# Patient Record
Sex: Female | Born: 1980
Health system: Southern US, Community
[De-identification: ages and names within clinical notes are randomized; demographics above are authoritative.]

## PROBLEM LIST (undated history)

## (undated) ENCOUNTER — Emergency Department (HOSPITAL_COMMUNITY): Payer: BLUE CROSS/BLUE SHIELD

## (undated) DIAGNOSIS — K219 Gastro-esophageal reflux disease without esophagitis: Secondary | ICD-10-CM

## (undated) DIAGNOSIS — R51 Headache: Secondary | ICD-10-CM

## (undated) DIAGNOSIS — F329 Major depressive disorder, single episode, unspecified: Secondary | ICD-10-CM

## (undated) DIAGNOSIS — F32A Depression, unspecified: Secondary | ICD-10-CM

## (undated) DIAGNOSIS — I10 Essential (primary) hypertension: Secondary | ICD-10-CM

## (undated) DIAGNOSIS — E669 Obesity, unspecified: Secondary | ICD-10-CM

## (undated) DIAGNOSIS — N809 Endometriosis, unspecified: Secondary | ICD-10-CM

## (undated) DIAGNOSIS — T4145XA Adverse effect of unspecified anesthetic, initial encounter: Secondary | ICD-10-CM

## (undated) DIAGNOSIS — T8859XA Other complications of anesthesia, initial encounter: Secondary | ICD-10-CM

## (undated) DIAGNOSIS — N83209 Unspecified ovarian cyst, unspecified side: Secondary | ICD-10-CM

## (undated) DIAGNOSIS — R519 Headache, unspecified: Secondary | ICD-10-CM

## (undated) HISTORY — PX: ABLATION: SHX5711

## (undated) HISTORY — PX: TUBAL LIGATION: SHX77

## (undated) HISTORY — PX: PARTIAL HYSTERECTOMY: SHX80

---

## 2007-07-20 ENCOUNTER — Ambulatory Visit: Payer: Self-pay | Admitting: Family Medicine

## 2007-08-07 ENCOUNTER — Emergency Department: Payer: Self-pay | Admitting: Internal Medicine

## 2007-09-22 ENCOUNTER — Encounter: Payer: Self-pay | Admitting: Family Medicine

## 2007-09-23 ENCOUNTER — Encounter: Payer: Self-pay | Admitting: Family Medicine

## 2008-06-08 ENCOUNTER — Emergency Department: Payer: Self-pay | Admitting: Emergency Medicine

## 2009-03-08 ENCOUNTER — Emergency Department: Payer: Self-pay | Admitting: Emergency Medicine

## 2009-05-02 ENCOUNTER — Emergency Department: Payer: Self-pay | Admitting: Emergency Medicine

## 2010-08-01 ENCOUNTER — Emergency Department: Payer: Self-pay | Admitting: Emergency Medicine

## 2011-12-15 ENCOUNTER — Ambulatory Visit: Payer: Self-pay | Admitting: Family Medicine

## 2012-03-14 ENCOUNTER — Emergency Department: Payer: Self-pay | Admitting: Emergency Medicine

## 2012-06-24 ENCOUNTER — Emergency Department: Payer: Self-pay | Admitting: Emergency Medicine

## 2013-05-15 ENCOUNTER — Emergency Department: Payer: Self-pay | Admitting: Emergency Medicine

## 2014-01-01 ENCOUNTER — Emergency Department: Payer: Self-pay | Admitting: Emergency Medicine

## 2014-01-01 LAB — CBC WITH DIFFERENTIAL/PLATELET
BASOS ABS: 0.1 10*3/uL (ref 0.0–0.1)
BASOS PCT: 1 %
EOS ABS: 0.1 10*3/uL (ref 0.0–0.7)
Eosinophil %: 0.4 %
HCT: 40.6 % (ref 35.0–47.0)
HGB: 12.9 g/dL (ref 12.0–16.0)
LYMPHS ABS: 2.2 10*3/uL (ref 1.0–3.6)
LYMPHS PCT: 15.2 %
MCH: 25.3 pg — ABNORMAL LOW (ref 26.0–34.0)
MCHC: 31.8 g/dL — ABNORMAL LOW (ref 32.0–36.0)
MCV: 79 fL — AB (ref 80–100)
Monocyte #: 0.8 x10 3/mm (ref 0.2–0.9)
Monocyte %: 5.7 %
Neutrophil #: 11.3 10*3/uL — ABNORMAL HIGH (ref 1.4–6.5)
Neutrophil %: 77.7 %
Platelet: 421 10*3/uL (ref 150–440)
RBC: 5.11 10*6/uL (ref 3.80–5.20)
RDW: 15.9 % — AB (ref 11.5–14.5)
WBC: 14.5 10*3/uL — ABNORMAL HIGH (ref 3.6–11.0)

## 2014-01-01 LAB — URINALYSIS, COMPLETE
BLOOD: NEGATIVE
Bacteria: NONE SEEN
Bilirubin,UR: NEGATIVE
GLUCOSE, UR: NEGATIVE mg/dL (ref 0–75)
KETONE: NEGATIVE
Leukocyte Esterase: NEGATIVE
Nitrite: NEGATIVE
PH: 5 (ref 4.5–8.0)
Protein: 30
RBC,UR: 7 /HPF (ref 0–5)
Specific Gravity: 1.029 (ref 1.003–1.030)
Squamous Epithelial: 10

## 2014-01-01 LAB — COMPREHENSIVE METABOLIC PANEL
ALK PHOS: 70 U/L
Albumin: 4.1 g/dL (ref 3.4–5.0)
Anion Gap: 3 — ABNORMAL LOW (ref 7–16)
BUN: 8 mg/dL (ref 7–18)
Bilirubin,Total: 0.4 mg/dL (ref 0.2–1.0)
CREATININE: 0.95 mg/dL (ref 0.60–1.30)
Calcium, Total: 9.6 mg/dL (ref 8.5–10.1)
Chloride: 102 mmol/L (ref 98–107)
Co2: 27 mmol/L (ref 21–32)
EGFR (African American): >60
EGFR (Non-African Amer.): >60
GLUCOSE: 104 mg/dL — AB (ref 65–99)
OSMOLALITY: 267 (ref 275–301)
Potassium: 3.6 mmol/L (ref 3.5–5.1)
SGOT(AST): 43 U/L — ABNORMAL HIGH (ref 15–37)
SGPT (ALT): 46 U/L (ref 12–78)
SODIUM: 132 mmol/L — AB (ref 136–145)
TOTAL PROTEIN: 9.9 g/dL — AB (ref 6.4–8.2)

## 2014-01-01 LAB — PROTEIN / CREATININE RATIO, URINE
Creatinine, Urine: 413.6 mg/dL — ABNORMAL HIGH (ref 30.0–125.0)
PROTEIN, RANDOM URINE: 25 mg/dL — AB (ref 0–12)
Protein/Creat. Ratio: 60 mg/gCREAT (ref 0–200)

## 2014-01-01 LAB — WET PREP, GENITAL

## 2014-01-01 LAB — GC/CHLAMYDIA PROBE AMP

## 2015-03-25 ENCOUNTER — Encounter
Admission: RE | Admit: 2015-03-25 | Discharge: 2015-03-25 | Disposition: A | Discharge status: Home / Self Care | Payer: BLUE CROSS/BLUE SHIELD | Source: Ambulatory Visit | Attending: Obstetrics and Gynecology | Admitting: Obstetrics and Gynecology

## 2015-03-25 DIAGNOSIS — Z801 Family history of malignant neoplasm of trachea, bronchus and lung: Secondary | ICD-10-CM | POA: Diagnosis not present

## 2015-03-25 DIAGNOSIS — R51 Headache: Secondary | ICD-10-CM | POA: Diagnosis not present

## 2015-03-25 DIAGNOSIS — G8929 Other chronic pain: Secondary | ICD-10-CM | POA: Diagnosis not present

## 2015-03-25 DIAGNOSIS — Z01812 Encounter for preprocedural laboratory examination: Secondary | ICD-10-CM | POA: Insufficient documentation

## 2015-03-25 DIAGNOSIS — K219 Gastro-esophageal reflux disease without esophagitis: Secondary | ICD-10-CM | POA: Diagnosis not present

## 2015-03-25 DIAGNOSIS — Z6841 Body Mass Index (BMI) 40.0 and over, adult: Secondary | ICD-10-CM | POA: Diagnosis not present

## 2015-03-25 DIAGNOSIS — M545 Low back pain: Secondary | ICD-10-CM | POA: Diagnosis not present

## 2015-03-25 DIAGNOSIS — Z8249 Family history of ischemic heart disease and other diseases of the circulatory system: Secondary | ICD-10-CM | POA: Diagnosis not present

## 2015-03-25 DIAGNOSIS — G43909 Migraine, unspecified, not intractable, without status migrainosus: Secondary | ICD-10-CM | POA: Diagnosis not present

## 2015-03-25 DIAGNOSIS — F329 Major depressive disorder, single episode, unspecified: Secondary | ICD-10-CM | POA: Diagnosis not present

## 2015-03-25 DIAGNOSIS — E785 Hyperlipidemia, unspecified: Secondary | ICD-10-CM | POA: Diagnosis not present

## 2015-03-25 DIAGNOSIS — Z9889 Other specified postprocedural states: Secondary | ICD-10-CM | POA: Insufficient documentation

## 2015-03-25 DIAGNOSIS — Z803 Family history of malignant neoplasm of breast: Secondary | ICD-10-CM | POA: Diagnosis not present

## 2015-03-25 DIAGNOSIS — E669 Obesity, unspecified: Secondary | ICD-10-CM | POA: Diagnosis not present

## 2015-03-25 DIAGNOSIS — N920 Excessive and frequent menstruation with regular cycle: Secondary | ICD-10-CM | POA: Diagnosis not present

## 2015-03-25 HISTORY — DX: Headache: R51

## 2015-03-25 HISTORY — DX: Headache, unspecified: R51.9

## 2015-03-25 HISTORY — DX: Gastro-esophageal reflux disease without esophagitis: K21.9

## 2015-03-25 LAB — CBC
HCT: 39 % (ref 35.0–47.0)
Hemoglobin: 12.3 g/dL (ref 12.0–16.0)
MCH: 26.2 pg (ref 26.0–34.0)
MCHC: 31.7 g/dL — ABNORMAL LOW (ref 32.0–36.0)
MCV: 82.8 fL (ref 80.0–100.0)
PLATELETS: 385 10*3/uL (ref 150–440)
RBC: 4.71 MIL/uL (ref 3.80–5.20)
RDW: 17.4 % — AB (ref 11.5–14.5)
WBC: 7.8 10*3/uL (ref 3.6–11.0)

## 2015-03-25 LAB — BASIC METABOLIC PANEL
ANION GAP: 8 (ref 5–15)
BUN: 7 mg/dL (ref 6–20)
CO2: 27 mmol/L (ref 22–32)
Calcium: 9.3 mg/dL (ref 8.9–10.3)
Chloride: 105 mmol/L (ref 101–111)
Creatinine, Ser: 0.71 mg/dL (ref 0.44–1.00)
GFR calc non Af Amer: >60 mL/min (ref >60)
Glucose, Bld: 101 mg/dL — ABNORMAL HIGH (ref 65–99)
Potassium: 3.6 mmol/L (ref 3.5–5.1)
Sodium: 140 mmol/L (ref 135–145)

## 2015-05-12 ENCOUNTER — Other Ambulatory Visit: Payer: Self-pay

## 2015-05-12 ENCOUNTER — Encounter: Payer: Self-pay | Admitting: *Deleted

## 2015-05-12 DIAGNOSIS — N920 Excessive and frequent menstruation with regular cycle: Secondary | ICD-10-CM | POA: Diagnosis not present

## 2015-05-12 NOTE — Patient Instructions (Signed)
  Your procedure is scheduled on:05/27/15 Report to Day Surgery. To find out your arrival time please call [phone] between 1PM - 3PM on 05/23/15.  Remember: Instructions that are not followed completely may result in serious medical risk, up to and including death, or upon the discretion of your surgeon and anesthesiologist your surgery may need to be rescheduled.    _x___ 1. Do not eat food or drink liquids after midnight. No gum chewing or hard candies.     _x___ 2. No Alcohol for 24 hours before or after surgery.   ____ 3. Bring all medications with you on the day of surgery if instructed.    __x__ 4. Notify your doctor if there is any change in your medical condition     (cold, fever, infections).     Do not wear jewelry, make-up, hairpins, clips or nail polish.  Do not wear lotions, powders, or perfumes. You may wear deodorant.  Do not shave 48 hours prior to surgery. Men may shave face and neck.  Do not bring valuables to the hospital.    [facility] is not responsible for any belongings or valuables.               Contacts, dentures or bridgework may not be worn into surgery.  Leave your suitcase in the car. After surgery it may be brought to your room.  For patients admitted to the hospital, discharge time is determined by your                treatment team.   Patients discharged the day of surgery will not be allowed to drive home.   Please read over the following fact sheets that you were given:   Surgical Site Infection Prevention   ____ Take these medicines the morning of surgery with A SIP OF WATER:    1.   2.   3.   4.  5.  6.  ____ Fleet Enema (as directed)   ____ Use CHG Soap as directed  ____ Use inhalers on the day of surgery  ____ Stop metformin 2 days prior to surgery    ____ Take 1/2 of usual insulin dose the night before surgery and none on the morning of surgery.   ____ Stop Coumadin/Plavix/aspirin on  ____ Stop Anti-inflammatories on     ____ Stop supplements until after surgery.    ____ Bring C-Pap to the hospital.

## 2015-05-20 ENCOUNTER — Other Ambulatory Visit: Payer: Self-pay

## 2015-05-21 MED ORDER — NA HYALUR & NA CHOND-NA HYALUR 0.55-0.5 ML IO KIT
PACK | INTRAOCULAR | Status: AC
  Filled 2015-05-21: qty 1.05

## 2015-05-21 MED ORDER — CEFUROXIME OPHTHALMIC INJECTION 1 MG/0.1 ML
INJECTION | OPHTHALMIC | Status: AC
  Filled 2015-05-21: qty 0.1

## 2015-05-27 ENCOUNTER — Ambulatory Visit: Payer: BLUE CROSS/BLUE SHIELD

## 2015-05-27 ENCOUNTER — Ambulatory Visit: Payer: BLUE CROSS/BLUE SHIELD | Admitting: Anesthesiology

## 2015-05-27 ENCOUNTER — Ambulatory Visit
Admission: RE | Admit: 2015-05-27 | Discharge: 2015-05-27 | Disposition: A | Discharge status: Home / Self Care | Payer: BLUE CROSS/BLUE SHIELD | Source: Ambulatory Visit | Attending: Obstetrics and Gynecology | Admitting: Obstetrics and Gynecology

## 2015-05-27 ENCOUNTER — Encounter
Admission: RE | Disposition: A | Discharge status: Home / Self Care | Payer: Self-pay | Source: Ambulatory Visit | Attending: Obstetrics and Gynecology

## 2015-05-27 ENCOUNTER — Encounter: Payer: Self-pay | Admitting: *Deleted

## 2015-05-27 DIAGNOSIS — Z801 Family history of malignant neoplasm of trachea, bronchus and lung: Secondary | ICD-10-CM | POA: Insufficient documentation

## 2015-05-27 DIAGNOSIS — G43909 Migraine, unspecified, not intractable, without status migrainosus: Secondary | ICD-10-CM | POA: Insufficient documentation

## 2015-05-27 DIAGNOSIS — Z8249 Family history of ischemic heart disease and other diseases of the circulatory system: Secondary | ICD-10-CM | POA: Insufficient documentation

## 2015-05-27 DIAGNOSIS — Z803 Family history of malignant neoplasm of breast: Secondary | ICD-10-CM | POA: Insufficient documentation

## 2015-05-27 DIAGNOSIS — K219 Gastro-esophageal reflux disease without esophagitis: Secondary | ICD-10-CM | POA: Insufficient documentation

## 2015-05-27 DIAGNOSIS — G8929 Other chronic pain: Secondary | ICD-10-CM | POA: Insufficient documentation

## 2015-05-27 DIAGNOSIS — Z6841 Body Mass Index (BMI) 40.0 and over, adult: Secondary | ICD-10-CM | POA: Insufficient documentation

## 2015-05-27 DIAGNOSIS — M545 Low back pain: Secondary | ICD-10-CM | POA: Insufficient documentation

## 2015-05-27 DIAGNOSIS — N920 Excessive and frequent menstruation with regular cycle: Secondary | ICD-10-CM | POA: Insufficient documentation

## 2015-05-27 DIAGNOSIS — F329 Major depressive disorder, single episode, unspecified: Secondary | ICD-10-CM | POA: Insufficient documentation

## 2015-05-27 DIAGNOSIS — E785 Hyperlipidemia, unspecified: Secondary | ICD-10-CM | POA: Insufficient documentation

## 2015-05-27 DIAGNOSIS — E669 Obesity, unspecified: Secondary | ICD-10-CM | POA: Insufficient documentation

## 2015-05-27 HISTORY — PX: HYSTEROSCOPY WITH NOVASURE: SHX5574

## 2015-05-27 LAB — POCT PREGNANCY, URINE: Preg Test, Ur: NEGATIVE

## 2015-05-27 SURGERY — HYSTEROSCOPY WITH NOVASURE
Anesthesia: General

## 2015-05-27 MED ORDER — SILVER NITRATE-POT NITRATE 75-25 % EX MISC
CUTANEOUS | Status: AC
  Filled 2015-05-27: qty 2

## 2015-05-27 MED ORDER — MIDAZOLAM HCL 2 MG/2ML IJ SOLN
INTRAMUSCULAR | Status: DC | PRN
  Administered 2015-05-27: 2 mg via INTRAVENOUS

## 2015-05-27 MED ORDER — DOCUSATE SODIUM 100 MG PO CAPS: 100.0000 mg | ORAL_CAPSULE | Freq: Two times a day (BID) | ORAL | Status: DC

## 2015-05-27 MED ORDER — ACETAMINOPHEN 500 MG PO TABS
1000.0000 mg | ORAL_TABLET | Freq: Once | ORAL | Status: AC
  Administered 2015-05-27: 1000 mg via ORAL
  Filled 2015-05-27: qty 2

## 2015-05-27 MED ORDER — FAMOTIDINE 20 MG PO TABS: 20.0000 mg | ORAL_TABLET | Freq: Once | ORAL | Status: DC

## 2015-05-27 MED ORDER — LIDOCAINE-EPINEPHRINE 1 %-1:100000 IJ SOLN
INTRAMUSCULAR | Status: DC | PRN
  Administered 2015-05-27: 20 mL

## 2015-05-27 MED ORDER — FAMOTIDINE 20 MG PO TABS
20.0000 mg | ORAL_TABLET | Freq: Once | ORAL | Status: AC
  Administered 2015-05-27: 20 mg via ORAL

## 2015-05-27 MED ORDER — FENTANYL CITRATE (PF) 100 MCG/2ML IJ SOLN
INTRAMUSCULAR | Status: DC | PRN
  Administered 2015-05-27: 150 ug via INTRAVENOUS
  Administered 2015-05-27 (×2): 50 ug via INTRAVENOUS

## 2015-05-27 MED ORDER — LIDOCAINE HCL (CARDIAC) 20 MG/ML IV SOLN
INTRAVENOUS | Status: DC | PRN
  Administered 2015-05-27: 50 mg via INTRAVENOUS

## 2015-05-27 MED ORDER — ONDANSETRON HCL 4 MG/2ML IJ SOLN
INTRAMUSCULAR | Status: DC | PRN
  Administered 2015-05-27: 4 mg via INTRAVENOUS

## 2015-05-27 MED ORDER — LACTATED RINGERS IV SOLN
INTRAVENOUS | Status: DC
  Administered 2015-05-27: 11:00:00 via INTRAVENOUS

## 2015-05-27 MED ORDER — HYDROCODONE-ACETAMINOPHEN 5-325 MG PO TABS: 1.0000 | ORAL_TABLET | Freq: Four times a day (QID) | ORAL | Status: DC | PRN

## 2015-05-27 MED ORDER — IBUPROFEN 200 MG PO TABS: 200.0000 mg | ORAL_TABLET | Freq: Four times a day (QID) | ORAL | Status: DC

## 2015-05-27 MED ORDER — DEXAMETHASONE SODIUM PHOSPHATE 4 MG/ML IJ SOLN
INTRAMUSCULAR | Status: DC | PRN
  Administered 2015-05-27: 5 mg via INTRAVENOUS

## 2015-05-27 MED ORDER — KETOROLAC TROMETHAMINE 30 MG/ML IJ SOLN
INTRAMUSCULAR | Status: DC | PRN
  Administered 2015-05-27: 30 mg via INTRAVENOUS

## 2015-05-27 MED ORDER — SILVER NITRATE-POT NITRATE 75-25 % EX MISC
CUTANEOUS | Status: DC | PRN
Start: 2015-05-27 — End: 2015-05-27
  Administered 2015-05-27: 2

## 2015-05-27 MED ORDER — SUCCINYLCHOLINE CHLORIDE 20 MG/ML IJ SOLN
INTRAMUSCULAR | Status: DC | PRN
  Administered 2015-05-27: 100 mg via INTRAVENOUS

## 2015-05-27 MED ORDER — LACTATED RINGERS IV SOLN: INTRAVENOUS | Status: DC

## 2015-05-27 SURGICAL SUPPLY — 20 items
CANISTER SUCT 3000ML (MISCELLANEOUS) ×3 IMPLANT
CATH ROBINSON RED A/P 16FR (CATHETERS) ×3 IMPLANT
GLOVE BIO SURGEON STRL SZ7 (GLOVE) ×3 IMPLANT
GLOVE INDICATOR 7.5 STRL GRN (GLOVE) ×3 IMPLANT
GOWN STRL REUS W/ TWL LRG LVL3 (GOWN DISPOSABLE) ×1 IMPLANT
GOWN STRL REUS W/ TWL XL LVL3 (GOWN DISPOSABLE) ×1 IMPLANT
GOWN STRL REUS W/TWL LRG LVL3 (GOWN DISPOSABLE) ×2
GOWN STRL REUS W/TWL XL LVL3 (GOWN DISPOSABLE) ×2
IV LACTATED RINGERS 1000ML (IV SOLUTION) ×3 IMPLANT
KIT RM TURNOVER CYSTO AR (KITS) ×3 IMPLANT
NEEDLE SPNL 22GX5 LNG QUINC BK (NEEDLE) ×3 IMPLANT
NOVASURE ENDOMETRIAL ABLATION (MISCELLANEOUS) IMPLANT
NS IRRIG 500ML POUR BTL (IV SOLUTION) ×3 IMPLANT
PACK DNC HYST (MISCELLANEOUS) ×3 IMPLANT
PAD OB MATERNITY 4.3X12.25 (PERSONAL CARE ITEMS) ×3 IMPLANT
PAD PREP 24X41 OB/GYN DISP (PERSONAL CARE ITEMS) ×3 IMPLANT
SYR CONTROL 10ML (SYRINGE) ×3 IMPLANT
TOWEL OR 17X26 4PK STRL BLUE (TOWEL DISPOSABLE) ×3 IMPLANT
TUBING CONNECTING 10 (TUBING) ×2 IMPLANT
TUBING CONNECTING 10' (TUBING) ×1

## 2015-05-27 NOTE — Anesthesia Postprocedure Evaluation (Signed)
  Anesthesia Post-op Note  Patient: Sherry Watkins  Procedure(s) Performed: Procedure(s): HYSTEROSCOPY WITH NOVASURE (N/A)  Anesthesia type:General  Patient location: PACU  Post pain: Pain level controlled  Post assessment: Post-op Vital signs reviewed, Patient's Cardiovascular Status Stable, Respiratory Function Stable, Patent Airway and No signs of Nausea or vomiting  Post vital signs: Reviewed and stable  Last Vitals:  Filed Vitals:   05/27/15 1406  BP: 152/90  Pulse: 147  Temp: 36.8 C  Resp: 18    Level of consciousness: awake, alert  and patient cooperative  Complications: No apparent anesthesia complications

## 2015-05-27 NOTE — Discharge Instructions (Addendum)
[facility]  We will discuss your surgery once again in detail at your post-op visit in two to four weeks. If you havent already done so, please call to make your appointment as soon as possible.  [place] [facility] [place]  [address] [place], [place] [address] [place], [place] [address]  Phone: [phone] Phone: [phone]  Fax: [phone] Fax: [phone]   Hysteroscopy and endometrial ablation, Care After Refer to this sheet in the next few weeks. These instructions provide you with information on caring for yourself after your procedure. Your health care provider may also give you more specific instructions. Your treatment has been planned according to current medical practices, but problems sometimes occur. Call your health care provider if you have any problems or questions after your procedure.  WHAT TO EXPECT AFTER THE PROCEDURE After your procedure, it is typical to have the following:  You may have some cramping. This normally lasts for a couple days.  You may have bleeding. This can vary from light spotting for a few days to menstrual-like bleeding for 3-7 days. HOME CARE INSTRUCTIONS  Rest for the first 1-2 days after the procedure.  Only take over-the-counter or prescription medicines as directed by your health care provider. Do not take aspirin. It can increase the chances of bleeding.  Take showers instead of baths for 2 weeks or as directed by your health care provider.  Do not drive for 24 hours or as directed.  Do not drink alcohol while taking pain medicine.  Do not use tampons, douche, or have sexual intercourse for 2 weeks or until your health care provider says it is okay.  Take your temperature twice a day for 4-5 days. Write it down each time.  Follow your health care provider's advice about diet, exercise, and lifting.  If you develop constipation, you may:  Take a mild laxative if your health care provider approves.  Add  bran foods to your diet.  Drink enough fluids to keep your urine clear or pale yellow.  Try to have someone with you or available to you for the first 24-48 hours, especially if you were given a general anesthetic.  Follow up with your health care provider as directed. SEEK MEDICAL CARE IF:  You feel dizzy or lightheaded.  You feel sick to your stomach (nauseous).  You have abnormal vaginal discharge.  You have a rash.  You have pain that is not controlled with medicine. SEEK IMMEDIATE MEDICAL CARE IF:  You have bleeding that is heavier than a normal menstrual period.  You have a fever.  You have increasing cramps or pain, not controlled with medicine.  You have new belly (abdominal) pain.  You pass out.  You have pain in the tops of your shoulders (shoulder strap areas).  You have shortness of breath. Document Released: 08/29/2013 Document Reviewed: 08/29/2013 [facility] Patient Information 2015 [place], [place]. This information is not intended to replace advice given to you by your health care provider. Make sure you discuss any questions you have with your health care provider. AMBULATORY SURGERY  DISCHARGE INSTRUCTIONS   1) The drugs that you were given will stay in your system until tomorrow so for the next 24 hours you should not:  A) Drive an automobile B) Make any legal decisions C) Drink any alcoholic beverage   2) You may resume regular meals tomorrow.  Today it is better to start with liquids and gradually work up to solid foods.  You may eat  anything you prefer, but it is better to start with liquids, then soup and crackers, and gradually work up to solid foods.   3) Please notify your doctor immediately if you have any unusual bleeding, trouble breathing, redness and pain at the surgery site, drainage, fever, or pain not relieved by medication.    4) Additional Instructions:        Please contact your physician with any problems or Same Day  Surgery at [phone], Monday through Friday 6 am to 4 pm, or [place] at [facility] number at [phone].AMBULATORY SURGERY  DISCHARGE INSTRUCTIONS   5) The drugs that you were given will stay in your system until tomorrow so for the next 24 hours you should not:  D) Drive an automobile E) Make any legal decisions F) Drink any alcoholic beverage   6) You may resume regular meals tomorrow.  Today it is better to start with liquids and gradually work up to solid foods.  You may eat anything you prefer, but it is better to start with liquids, then soup and crackers, and gradually work up to solid foods.   7) Please notify your doctor immediately if you have any unusual bleeding, trouble breathing, redness and pain at the surgery site, drainage, fever, or pain not relieved by medication.    8) Additional Instructions:        Please contact your physician with any problems or Same Day Surgery at [phone], Monday through Friday 6 am to 4 pm, or [place] at [facility] number at [phone].

## 2015-05-27 NOTE — H&P (Signed)
GYN H&P Patient still amenable with proceeding with hysteroscopy, novasure for menorrhagia. All questions asked and answered and will proceed when all parties are ready.  Durene Romans MD [facility]  Pager: [phone]

## 2015-05-27 NOTE — Anesthesia Preprocedure Evaluation (Signed)
Anesthesia Evaluation  Patient identified by MRN, date of birth, ID band Patient awake    Reviewed: Allergy & Precautions, NPO status , Patient's Chart, lab work & pertinent test results  Airway Mallampati: III  TM Distance: <3 FB Neck ROM: Full   Comment: Large neck Dental  (+) Chipped   Pulmonary neg pulmonary ROS,  breath sounds clear to auscultation  Pulmonary exam normal       Cardiovascular negative cardio ROS Normal cardiovascular exam No Hx of HTN , but BP up today   Neuro/Psych  Headaches, negative psych ROS   GI/Hepatic Neg liver ROS, GERD-  ,  Endo/Other  negative endocrine ROS  Renal/GU negative Renal ROS  negative genitourinary   Musculoskeletal negative musculoskeletal ROS (+)   Abdominal (+) + obese,   Peds negative pediatric ROS (+)  Hematology negative hematology ROS (+)   Anesthesia Other Findings   Reproductive/Obstetrics                             Anesthesia Physical Anesthesia Plan  ASA: II  Anesthesia Plan: General   Post-op Pain Management:    Induction: Intravenous and Rapid sequence  Airway Management Planned: Oral ETT  Additional Equipment:   Intra-op Plan:   Post-operative Plan: Extubation in OR  Informed Consent: I have reviewed the patients History and Physical, chart, labs and discussed the procedure including the risks, benefits and alternatives for the proposed anesthesia with the patient or authorized representative who has indicated his/her understanding and acceptance.   Dental advisory given  Plan Discussed with: CRNA and Surgeon  Anesthesia Plan Comments: (Patient has a large neck with significant reflux Hx)        Anesthesia Quick Evaluation

## 2015-05-27 NOTE — Anesthesia Procedure Notes (Signed)
Procedure Name: Intubation Date/Time: 05/27/2015 1:16 PM Performed by: Rolla Plate Pre-anesthesia Checklist: Patient identified, Patient being monitored, Timeout performed, Emergency Drugs available and Suction available Patient Re-evaluated:Patient Re-evaluated prior to inductionOxygen Delivery Method: Circle system utilized Preoxygenation: Pre-oxygenation with 100% oxygen Intubation Type: IV induction and Rapid sequence Laryngoscope Size: Miller and 2 Grade View: Grade II Tube type: Oral Tube size: 7.0 mm Number of attempts: 1 Airway Equipment and Method: Stylet and Patient positioned with wedge pillow Placement Confirmation: ETT inserted through vocal cords under direct vision,  positive ETCO2 and breath sounds checked- equal and bilateral Secured at: 22 cm Tube secured with: Tape Dental Injury: Teeth and Oropharynx as per pre-operative assessment

## 2015-05-27 NOTE — Progress Notes (Signed)
Peri pad - clean, dry and intact

## 2015-05-27 NOTE — Op Note (Addendum)
Operative Note   05/27/2015  PRE-OP DIAGNOSIS: Menorhagia, history of migraines, BMI 44   POST-OP DIAGNOSIS: Same   SURGEON: Surgeon(s) and Role:    * Durene Romans, MD - Primary  ASSISTANT: None  PROCEDURE: HYSTEROSCOPY WITH NOVASURE ENDOMETRIAL ABLATION  ANESTHESIA: General and paracervical block  ESTIMATED BLOOD LOSS: 68mL  DRAINS: I/O cath for 1mL UOP   TOTAL IV FLUIDS: 541mL crystalloid  SPECIMENS: none  VTE PROPHYLAXIS: SCDs to the bilateral lower extremities  ANTIBIOTICS: not indicated  FLUID DEFICIT: 188CZ  COMPLICATIONS: None  DISPOSITION: PACU - hemodynamically stable.  CONDITION: stable  INDICATIONS: options discussed with patient, including Mirena, in terms of minor surgery/medical management and patient elected for Novasure  FINDINGS: Exam under anesthesia revealed small, mobile anteverted uterus with no masses and bilateral adnexa without masses or fullness. Hysteroscopy revealed a grossly normal appearing uterine cavity with bilateral tubal ostia and normal appearing endocervical canal. After endometrial ablation, repeat ablation noted uniform endometrial ablation, down to the internal cervical os, with the cervical canal unablated  PROCEDURE IN DETAIL:  After informed consent was obtained, the patient was taken to the operating room where anesthesia was obtained without difficulty. The patient was positioned in the dorsal lithotomy position in [place].  The patient's bladder was catheterized with an in and out foley catheter.  The patient was examined under anesthesia, with the above noted findings.  The bi-valved speculum was placed inside the patient's vagina, and the the anterior lip of the cervix was seen and grasped with the tenaculum.  A paracervical block was achieved with 53mL of 1% lidocaine.  The cervical length was 5cm and the uterine cavity length was 5cm. The cervix was already dilated enough to fit the 58mm hysteroscope with the above  noted findings.  Next, the Novasure was then deployed, in the usual fashion, with cavity width reading 4.6cm.  After pre test checks, it was activated for 103 seconds at 127 power. It was then removed and repeat hysteroscopy performed, with the above noted findings. All instruments were removed after excellent hemostasis was noted, with silver nitrate applied to the tenaculum site. All instruments were then removed and the patient taken out of lithotomy.  The patient tolerated the procedure well.  Sponge and instrument counts were correct x 2.  The patient was taken to recovery room in excellent condition.  Durene Romans MD [facility] Pager [phone]

## 2015-05-27 NOTE — Transfer of Care (Signed)
Immediate Anesthesia Transfer of Care Note  Patient: JIAYI LENGACHER  Procedure(s) Performed: Procedure(s): HYSTEROSCOPY WITH NOVASURE (N/A)  Patient Location: PACU  Anesthesia Type:General  Level of Consciousness: awake  Airway & Oxygen Therapy: Patient Spontanous Breathing and Patient connected to face mask oxygen  Post-op Assessment: Report given to RN  Post vital signs: Reviewed  Last Vitals:  Filed Vitals:   05/27/15 1406  BP: 152/90  Pulse: 128  Temp: 36.8 C  Resp: 17    Complications: No apparent anesthesia complications

## 2015-05-28 NOTE — Anesthesia Postprocedure Evaluation (Deleted)
  Anesthesia Post-op Note  Patient: Sherry Watkins  Procedure(s) Performed: Procedure(s): HYSTEROSCOPY WITH NOVASURE (N/A)  Anesthesia type:General  Patient location: PACU  Post pain: Pain level controlled  Post assessment: Post-op Vital signs reviewed, Patient's Cardiovascular Status Stable, Respiratory Function Stable, Patent Airway and No signs of Nausea or vomiting  Post vital signs: Reviewed and stable  Last Vitals:  Filed Vitals:   05/27/15 1544  BP: 156/92  Pulse: 87  Temp:   Resp: 18    Level of consciousness: awake, alert  and patient cooperative  Complications: No apparent anesthesia complications

## 2015-06-03 ENCOUNTER — Emergency Department
Admission: EM | Admit: 2015-06-03 | Discharge: 2015-06-03 | Disposition: A | Discharge status: Home / Self Care | Payer: BLUE CROSS/BLUE SHIELD | Attending: Emergency Medicine | Admitting: Emergency Medicine

## 2015-06-03 ENCOUNTER — Encounter: Payer: Self-pay | Admitting: Emergency Medicine

## 2015-06-03 DIAGNOSIS — J029 Acute pharyngitis, unspecified: Secondary | ICD-10-CM | POA: Diagnosis present

## 2015-06-03 DIAGNOSIS — K219 Gastro-esophageal reflux disease without esophagitis: Secondary | ICD-10-CM | POA: Insufficient documentation

## 2015-06-03 DIAGNOSIS — Z79899 Other long term (current) drug therapy: Secondary | ICD-10-CM | POA: Diagnosis not present

## 2015-06-03 LAB — POCT RAPID STREP A: Streptococcus, Group A Screen (Direct): NEGATIVE

## 2015-06-03 MED ORDER — FAMOTIDINE 20 MG PO TABS: 20.0000 mg | ORAL_TABLET | Freq: Two times a day (BID) | ORAL | Status: DC

## 2015-06-03 MED ORDER — FAMOTIDINE 20 MG PO TABS
40.0000 mg | ORAL_TABLET | Freq: Two times a day (BID) | ORAL | Status: DC
  Administered 2015-06-03: 40 mg via ORAL
  Filled 2015-06-03: qty 2

## 2015-06-03 NOTE — ED Notes (Signed)
Pt reports sore throat x1 week; Pt reports throat feeling "tight"; airway in tact, no distress noted.

## 2015-06-03 NOTE — ED Provider Notes (Signed)
[facility] Emergency Department Provider Note ____________________________________________  Time seen: 1218  I have reviewed the triage vital signs and the nursing notes.  HISTORY  Chief Complaint  Sore Throat  HPI Sherry Watkins is a 34 y.o. female reports throat tightness over the last week.  She denies SOB, dysphagia, or choking.  She reports recent intubation during gynecology surgery last week. Symptoms began after the procedure. She also gives a history of reflux, for which she is not currently being treated. She denies any fevers, chills, sweats, or nausea.   Past Medical History  Diagnosis Date  . GERD (gastroesophageal reflux disease)   . Headache    There are no active problems to display for this patient.  Past Surgical History  Procedure Laterality Date  . Cesarean section    . Tubal ligation    . Hysteroscopy with novasure N/A 05/27/2015    Procedure: HYSTEROSCOPY WITH NOVASURE;  Surgeon: Aletha Halim, MD;  Location: [facility] ORS;  Service: Gynecology;  Laterality: N/A;    Current Outpatient Rx  Name  Route  Sig  Dispense  Refill  . docusate sodium (COLACE) 100 MG capsule   Oral   Take 1 capsule (100 mg total) by mouth 2 (two) times daily.   30 capsule   1   . famotidine (PEPCID) 20 MG tablet   Oral   Take 1 tablet (20 mg total) by mouth 2 (two) times daily.   30 tablet   0   . HYDROcodone-acetaminophen (NORCO/VICODIN) 5-325 MG per tablet   Oral   Take 1 tablet by mouth every 6 (six) hours as needed for moderate pain.   4 tablet   0   . ibuprofen (ADVIL,MOTRIN) 200 MG tablet   Oral   Take 1 tablet (200 mg total) by mouth every 6 (six) hours. 200-600mg  po q6h x 24hrs and the q6h prn pain   30 tablet   1   . Multiple Vitamin (MULTIVITAMIN) capsule   Oral   Take 1 capsule by mouth daily.          Allergies Review of patient's allergies indicates no known allergies.  No family history on file.  Social History History   Substance Use Topics  . Smoking status: Never Smoker   . Smokeless tobacco: Never Used  . Alcohol Use: No   Review of Systems  Constitutional: Negative for fever. Eyes: Negative for visual changes. ENT: Negative for sore throat. Throat tightness as above. Cardiovascular: Negative for chest pain. Respiratory: Negative for shortness of breath. Gastrointestinal: Negative for abdominal pain, vomiting and diarrhea. Report reflux Genitourinary: Negative for dysuria. Musculoskeletal: Negative for back pain. Skin: Negative for rash. Neurological: Negative for headaches, focal weakness or numbness. ____________________________________________  PHYSICAL EXAM:  VITAL SIGNS: ED Triage Vitals  Enc Vitals Group     BP 06/03/15 1151 135/80 mmHg     Pulse Rate 06/03/15 1151 88     Resp 06/03/15 1151 16     Temp 06/03/15 1151 97.5 F (36.4 C)     Temp Source 06/03/15 1151 Oral     SpO2 06/03/15 1151 100 %     Weight 06/03/15 1151 223 lb (101.152 kg)     Height 06/03/15 1151 5\' 3"  (1.6 m)     Head Cir --      Peak Flow --      Pain Score 06/03/15 1152 0     Pain Loc --      Pain Edu? --  Excl. in [place]? --    Constitutional: Alert and oriented. Well appearing and in no distress. Eyes: Conjunctivae are normal. PERRL. Normal extraocular movements. ENT   Head: Normocephalic and atraumatic.   Nose: No congestion/rhinnorhea.   Mouth/Throat: Mucous membranes are moist.   Neck: Supple. No thyromegaly. Hematological/Lymphatic/Immunilogical: No cervical lymphadenopathy. Cardiovascular: Normal rate, regular rhythm.  Respiratory: Normal respiratory effort. No wheezes/rales/rhonchi. Gastrointestinal: Soft and nontender. No distention. Musculoskeletal: Nontender with normal range of motion in all extremities.  Neurologic:  Normal gait without ataxia. Normal speech and language. No gross focal neurologic deficits are appreciated. Skin:  Skin is warm, dry and intact. No rash  noted. Psychiatric: Mood and affect are normal. Patient exhibits appropriate insight and judgment. ____________________________________________  PROCEDURES  Famotidine 40 mg PO ____________________________________________  INITIAL IMPRESSION / ASSESSMENT AND PLAN / ED COURSE  Gastroesophageal reflux and throat irritation following recent intubation. Prescription Pepcid 20 mg BID. Follow-up with Dr. Iona Beard for continued care.  ____________________________________________  FINAL CLINICAL IMPRESSION(S) / ED DIAGNOSES  Final diagnoses:  Gastroesophageal reflux disease without esophagitis     Melvenia Needles, PA-C 06/03/15 1245  Nance Pear, MD 06/03/15 1248

## 2015-06-03 NOTE — Discharge Instructions (Signed)
Gastroesophageal Reflux Disease, Adult Gastroesophageal reflux disease (GERD) happens when acid from your stomach goes into your food pipe (esophagus). The acid can cause a burning feeling in your chest. Over time, the acid can make small holes (ulcers) in your food pipe.  HOME CARE  Ask your doctor for advice about:  Losing weight.  Quitting smoking.  Alcohol use.  Avoid foods and drinks that make your problems worse. You may want to avoid:  Caffeine and alcohol.  Chocolate.  Mints.  Garlic and onions.  Spicy foods.  Citrus fruits, such as oranges, lemons, or limes.  Foods that contain tomato, such as sauce, chili, salsa, and pizza.  Fried and fatty foods.  Avoid lying down for 3 hours before you go to bed or before you take a nap.  Eat small meals often, instead of large meals.  Wear loose-fitting clothing. Do not wear anything tight around your waist.  Raise (elevate) the head of your bed 6 to 8 inches with wood blocks. Using extra pillows does not help.  Only take medicines as told by your doctor.  Do not take aspirin or ibuprofen. GET HELP RIGHT AWAY IF:   You have pain in your arms, neck, jaw, teeth, or back.  Your pain gets worse or changes.  You feel sick to your stomach (nauseous), throw up (vomit), or sweat (diaphoresis).  You feel short of breath, or you pass out (faint).  Your throw up is green, yellow, black, or looks like coffee grounds or blood.  Your poop (stool) is red, bloody, or black. MAKE SURE YOU:   Understand these instructions.  Will watch your condition.  Will get help right away if you are not doing well or get worse. Document Released: 04/26/2008 Document Revised: 01/31/2012 Document Reviewed: 05/28/2011 [facility] Patient Information 2015 [place], [place]. This information is not intended to replace advice given to you by your health care provider. Make sure you discuss any questions you have with your health care  provider.  Take the prescription meds as directed.  Follow-up with Dr. Iona Beard for continued symptoms.

## 2015-06-03 NOTE — ED Notes (Signed)
States she was intubated last week and has had sore throat since

## 2016-04-29 DIAGNOSIS — R350 Frequency of micturition: Secondary | ICD-10-CM | POA: Insufficient documentation

## 2016-04-29 DIAGNOSIS — Z5321 Procedure and treatment not carried out due to patient leaving prior to being seen by health care provider: Secondary | ICD-10-CM | POA: Insufficient documentation

## 2016-04-29 LAB — URINALYSIS COMPLETE WITH MICROSCOPIC (ARMC ONLY)
Bacteria, UA: NONE SEEN
Bilirubin Urine: NEGATIVE
Glucose, UA: NEGATIVE mg/dL
Hgb urine dipstick: NEGATIVE
Ketones, ur: NEGATIVE mg/dL
Nitrite: NEGATIVE
Protein, ur: NEGATIVE mg/dL
Specific Gravity, Urine: 1.019 (ref 1.005–1.030)
pH: 5 (ref 5.0–8.0)

## 2016-04-29 NOTE — ED Notes (Signed)
Pt in with co urinary urgency and frequency since Tuesday, hx of UTI's.

## 2016-04-30 ENCOUNTER — Telehealth: Payer: Self-pay | Admitting: Emergency Medicine

## 2016-04-30 ENCOUNTER — Emergency Department
Admission: EM | Admit: 2016-04-30 | Discharge: 2016-04-30 | Disposition: A | Discharge status: Home / Self Care | Payer: Self-pay | Attending: Emergency Medicine | Admitting: Emergency Medicine

## 2016-04-30 NOTE — ED Notes (Signed)
Called patient due to lwot to inquire about condition and follow up plans. Will call pcp to see if they can fit her in today, but they close at noon [employer] [place])  She will return here if they can't see her.

## 2016-05-01 LAB — URINE CULTURE

## 2016-11-01 DIAGNOSIS — N760 Acute vaginitis: Secondary | ICD-10-CM | POA: Insufficient documentation

## 2016-11-01 DIAGNOSIS — R102 Pelvic and perineal pain: Secondary | ICD-10-CM | POA: Insufficient documentation

## 2016-11-02 ENCOUNTER — Emergency Department: Payer: Self-pay

## 2016-11-02 ENCOUNTER — Emergency Department
Admission: EM | Admit: 2016-11-02 | Discharge: 2016-11-02 | Disposition: A | Discharge status: Home / Self Care | Payer: Self-pay | Attending: Emergency Medicine | Admitting: Emergency Medicine

## 2016-11-02 DIAGNOSIS — N760 Acute vaginitis: Secondary | ICD-10-CM

## 2016-11-02 DIAGNOSIS — R102 Pelvic and perineal pain: Secondary | ICD-10-CM

## 2016-11-02 DIAGNOSIS — B9689 Other specified bacterial agents as the cause of diseases classified elsewhere: Secondary | ICD-10-CM

## 2016-11-02 LAB — COMPREHENSIVE METABOLIC PANEL
ALK PHOS: 49 U/L (ref 38–126)
ALT: 22 U/L (ref 14–54)
ANION GAP: 9 (ref 5–15)
AST: 29 U/L (ref 15–41)
Albumin: 4.6 g/dL (ref 3.5–5.0)
BILIRUBIN TOTAL: 0.7 mg/dL (ref 0.3–1.2)
BUN: 7 mg/dL (ref 6–20)
CALCIUM: 9.3 mg/dL (ref 8.9–10.3)
CO2: 27 mmol/L (ref 22–32)
Chloride: 102 mmol/L (ref 101–111)
Creatinine, Ser: 0.8 mg/dL (ref 0.44–1.00)
GFR calc non Af Amer: >60 mL/min (ref >60)
Glucose, Bld: 95 mg/dL (ref 65–99)
Potassium: 3.4 mmol/L — ABNORMAL LOW (ref 3.5–5.1)
Sodium: 138 mmol/L (ref 135–145)
TOTAL PROTEIN: 8.8 g/dL — AB (ref 6.5–8.1)

## 2016-11-02 LAB — URINALYSIS, COMPLETE (UACMP) WITH MICROSCOPIC
Bacteria, UA: NONE SEEN
Bilirubin Urine: NEGATIVE
GLUCOSE, UA: NEGATIVE mg/dL
Ketones, ur: NEGATIVE mg/dL
Leukocytes, UA: NEGATIVE
NITRITE: NEGATIVE
PROTEIN: 30 mg/dL — AB
Specific Gravity, Urine: 1.03 (ref 1.005–1.030)
pH: 5 (ref 5.0–8.0)

## 2016-11-02 LAB — CBC
HCT: 47.3 % — ABNORMAL HIGH (ref 35.0–47.0)
HEMOGLOBIN: 15.8 g/dL (ref 12.0–16.0)
MCH: 30.1 pg (ref 26.0–34.0)
MCHC: 33.3 g/dL (ref 32.0–36.0)
MCV: 90.2 fL (ref 80.0–100.0)
Platelets: 359 10*3/uL (ref 150–440)
RBC: 5.24 MIL/uL — AB (ref 3.80–5.20)
RDW: 13.1 % (ref 11.5–14.5)
WBC: 12.5 10*3/uL — ABNORMAL HIGH (ref 3.6–11.0)

## 2016-11-02 LAB — CHLAMYDIA/NGC RT PCR (ARMC ONLY)
CHLAMYDIA TR: NOT DETECTED
N GONORRHOEAE: NOT DETECTED

## 2016-11-02 LAB — WET PREP, GENITAL
Sperm: NONE SEEN
Trich, Wet Prep: NONE SEEN
Yeast Wet Prep HPF POC: NONE SEEN

## 2016-11-02 LAB — LIPASE, BLOOD: Lipase: 19 U/L (ref 11–51)

## 2016-11-02 MED ORDER — OXYCODONE-ACETAMINOPHEN 5-325 MG PO TABS
2.0000 | ORAL_TABLET | Freq: Once | ORAL | Status: AC
  Administered 2016-11-02: 2 via ORAL
  Filled 2016-11-02: qty 2

## 2016-11-02 MED ORDER — TRAMADOL HCL 50 MG PO TABS: 50.0000 mg | ORAL_TABLET | Freq: Four times a day (QID) | ORAL | 0 refills | Status: DC | PRN

## 2016-11-02 MED ORDER — METRONIDAZOLE 500 MG PO TABS: 500.0000 mg | ORAL_TABLET | Freq: Two times a day (BID) | ORAL | 0 refills | Status: AC

## 2016-11-02 NOTE — Discharge Instructions (Signed)
Please take your antibiotics as prescribed for the entire course. Please return to the emergency department for any worsening pain, fever, or any other symptom personally concerning to yourself.

## 2016-11-02 NOTE — ED Provider Notes (Signed)
[facility] Emergency Department Provider Note  Time seen: 4:49 AM  I have reviewed the triage vital signs and the nursing notes.   HISTORY  Chief Complaint Abdominal Pain    HPI Sherry Watkins is a 35 y.o. female with a past medical history of gastric reflux disease who presents the emergency department with right lower quadrant pain. According to the patient the right lower quadrant pain started approximately 24 hours ago. States mild vaginal discharge. Denies fever, states nausea but denies vomiting. Denies diarrhea. Patient states she went to work tonight but developed pain so she left work and came to the ER for evaluation. Currently describes the pain as moderate.  Past Medical History:  Diagnosis Date  . GERD (gastroesophageal reflux disease)   . Headache     There are no active problems to display for this patient.   Past Surgical History:  Procedure Laterality Date  . CESAREAN SECTION    . HYSTEROSCOPY WITH NOVASURE N/A 05/27/2015   Procedure: HYSTEROSCOPY WITH NOVASURE;  Surgeon: Aletha Halim, MD;  Location: [facility] ORS;  Service: Gynecology;  Laterality: N/A;  . TUBAL LIGATION      Prior to Admission medications   Medication Sig Start Date End Date Taking? Authorizing Provider  Multiple Vitamin (MULTIVITAMIN) capsule Take 1 capsule by mouth daily.   Yes Historical Provider, MD  ranitidine (ZANTAC) 150 MG tablet Take 150 mg by mouth 2 (two) times daily.   Yes Historical Provider, MD  docusate sodium (COLACE) 100 MG capsule Take 1 capsule (100 mg total) by mouth 2 (two) times daily. 05/27/15   Aletha Halim, MD  famotidine (PEPCID) 20 MG tablet Take 1 tablet (20 mg total) by mouth 2 (two) times daily. 06/03/15   Jenise V Bacon Menshew, PA-C  HYDROcodone-acetaminophen (NORCO/VICODIN) 5-325 MG per tablet Take 1 tablet by mouth every 6 (six) hours as needed for moderate pain. 05/27/15   Aletha Halim, MD  ibuprofen (ADVIL,MOTRIN) 200 MG tablet Take 1  tablet (200 mg total) by mouth every 6 (six) hours. 200-600mg  po q6h x 24hrs and the q6h prn pain 05/27/15   Aletha Halim, MD    No Known Allergies  No family history on file.  Social History Social History  Substance Use Topics  . Smoking status: Never Smoker  . Smokeless tobacco: Never Used  . Alcohol use No    Review of Systems Constitutional: Negative for fever. Cardiovascular: Negative for chest pain. Respiratory: Negative for shortness of breath. Gastrointestinal: Right lower quadrant pain. Positive for nausea. Genitourinary: Negative for dysuria. Positive for mild vaginal discharge Neurological: Negative for headache 10-point ROS otherwise negative.  ____________________________________________   PHYSICAL EXAM:  VITAL SIGNS: ED Triage Vitals [11/02/16 0005]  Enc Vitals Group     BP 137/73     Pulse Rate 91     Resp 18     Temp 98.1 F (36.7 C)     Temp Source Oral     SpO2 98 %     Weight 224 lb (101.6 kg)     Height 5\' 2"  (1.575 m)     Head Circumference      Peak Flow      Pain Score 9     Pain Loc      Pain Edu?      Excl. in [place]?     Constitutional: Alert and oriented. Well appearing and in no distress. Eyes: Normal exam ENT   Head: Normocephalic and atraumatic.   Mouth/Throat: Mucous membranes  are moist. Cardiovascular: Normal rate, regular rhythm. No murmur Respiratory: Normal respiratory effort without tachypnea nor retractions. Breath sounds are clear  Gastrointestinal: Soft, minimal right pelvic/right lower quadrant (very low) tenderness palpation. No rebound or guarding. No distention. Musculoskeletal: Nontender with normal range of motion in all extremities.  Neurologic:  Normal speech and language. No gross focal neurologic deficits  Skin:  Skin is warm, dry and intact.  Psychiatric: Mood and affect are normal.   ____________________________________________     RADIOLOGY  Ultrasound  pending  ____________________________________________   INITIAL IMPRESSION / ASSESSMENT AND PLAN / ED COURSE  Pertinent labs & imaging results that were available during my care of the patient were reviewed by me and considered in my medical decision making (see chart for details).  The patient presents to the emergency department with right lower quadrant pain that started approximately 24 hours ago. States mild vaginal discharge. States nausea but denies vomiting. Denies diarrhea. Denies fever. Patient's labs are largely within normal limits besides a slight leukocytosis. We'll perform a pelvic examination. Patient's pain is lower down in the pelvis, no pain over McBurney's point.  Wet prep shows clue cells. Patient had moderate white/clear vaginal discharge on examination. Mild to moderate right adnexal tenderness to palpation. We'll proceed with an ultrasound to further evaluate.  Ultrasound pending. Patient care signed out to Dr. Cinda Quest  ____________________________________________   FINAL CLINICAL IMPRESSION(S) / ED DIAGNOSES  Pelvic pain Bacterial vaginosis    Harvest Dark, MD 11/02/16 [id number]

## 2016-11-02 NOTE — ED Triage Notes (Addendum)
Pt presents to ED with c/o RLQ pain that started today. Pt denies diarrhea, but reports (+) N/V with clear emesis. Pt denies any c/o chest pain or shortness of breath. Pt reports RLQ pain is sharp in nature and feels "like contractions". Pt is afebrile, denies fever or chills at home; pt also denies any change in bowel or bladder habits. Pt is A&O, in NAD, with respirations even, regular, and unlabored.

## 2016-11-02 NOTE — ED Provider Notes (Signed)
Study Result   CLINICAL DATA:  One day of right adnexal pain history of endometrial ablation in 2016  EXAM: [place] OF OVARIES  TECHNIQUE: Both transabdominal and transvaginal ultrasound examinations of the pelvis were performed. Transabdominal technique was performed for global imaging of the pelvis including uterus, ovaries, adnexal regions, and pelvic cul-de-sac.  It was necessary to proceed with endovaginal exam following the transabdominal exam to visualize the uterus, endometrium, ovaries, and adnexal regions. Color and duplex Doppler ultrasound was utilized to evaluate blood flow to the ovaries.  COMPARISON:  Pelvic ultrasound dated January 01, 2014  FINDINGS: Uterus  Measurements: 7.0 x 3.7 x 5.2 cm. The uterine echotexture is normal. A small fibroid measuring 9 x 6 x 8 mm is observed in the right aspect of the fundus. A subtle area of decreased echogenicity in the lower aspect of the uterus is observed which measures 1.5 x 0.5 x 1 cm. There are nabothian cysts.  Endometrium  Thickness: 6.8 mm.  No focal abnormality visualized.  Right ovary  Measurements: 2.5 x 1.4 x 2.2 cm. There is a simple appearing right ovarian cyst measuring 1.8 x 1.4 x 1.8 cm.  Left ovary  Measurements: 2.5 x 1.6 x 1.9 cm. Normal appearance/no adnexal mass.  Pulsed Doppler evaluation of both ovaries demonstrates normal low-resistance arterial and venous waveforms.  Other findings  No abnormal free fluid.  IMPRESSION: 1. Simple appearing right ovarian cyst measuring 1.8 x 1.4 x 1.8 cm. No solid or complex appearing adnexal mass on the left. The right ovary is normal in appearance. Vascularity of both ovaries is normal. 2. Small fundal fibroid to the right measures 9 mm in greatest dimension. A subtle area of decreased echogenicity more inferiorly in the uterus measures 15 x 5 x 10 mm and may  reflect a fibroid. 3. The endometrial stripe measures just under 7 mm in thickness. No endometrial masses or fluid collections.   Electronically Signed   By: David  Martinique M.D.   On: 11/02/2016 07:37    On reexam patient has pain very low in the pelvis on the right side to palpation patient says it's better than had been. She says she thought it might be her ovarian cyst. I will discharge the patient as planned with the descriptions written by Dr. Mamie Nick. for tramadol and Flagyl. Discussed these with the patient.   Nena Polio, MD 11/02/16 [phone]

## 2017-01-30 ENCOUNTER — Emergency Department: Payer: Self-pay

## 2017-01-30 ENCOUNTER — Encounter: Payer: Self-pay | Admitting: Emergency Medicine

## 2017-01-30 ENCOUNTER — Emergency Department
Admission: EM | Admit: 2017-01-30 | Discharge: 2017-01-30 | Disposition: A | Discharge status: Home / Self Care | Payer: Self-pay | Attending: Student in an Organized Health Care Education/Training Program | Admitting: Student in an Organized Health Care Education/Training Program

## 2017-01-30 DIAGNOSIS — Z791 Long term (current) use of non-steroidal anti-inflammatories (NSAID): Secondary | ICD-10-CM | POA: Insufficient documentation

## 2017-01-30 DIAGNOSIS — R1031 Right lower quadrant pain: Secondary | ICD-10-CM

## 2017-01-30 DIAGNOSIS — B9689 Other specified bacterial agents as the cause of diseases classified elsewhere: Secondary | ICD-10-CM

## 2017-01-30 DIAGNOSIS — D259 Leiomyoma of uterus, unspecified: Secondary | ICD-10-CM | POA: Insufficient documentation

## 2017-01-30 DIAGNOSIS — N760 Acute vaginitis: Secondary | ICD-10-CM | POA: Insufficient documentation

## 2017-01-30 HISTORY — DX: Unspecified ovarian cyst, unspecified side: N83.209

## 2017-01-30 LAB — COMPREHENSIVE METABOLIC PANEL
ALT: 21 U/L (ref 14–54)
ANION GAP: 6 (ref 5–15)
AST: 23 U/L (ref 15–41)
Albumin: 3.9 g/dL (ref 3.5–5.0)
Alkaline Phosphatase: 51 U/L (ref 38–126)
BUN: 7 mg/dL (ref 6–20)
CHLORIDE: 105 mmol/L (ref 101–111)
CO2: 27 mmol/L (ref 22–32)
Calcium: 9.2 mg/dL (ref 8.9–10.3)
Creatinine, Ser: 0.62 mg/dL (ref 0.44–1.00)
GFR calc Af Amer: >60 mL/min (ref >60)
GFR calc non Af Amer: >60 mL/min (ref >60)
Glucose, Bld: 105 mg/dL — ABNORMAL HIGH (ref 65–99)
POTASSIUM: 4 mmol/L (ref 3.5–5.1)
SODIUM: 138 mmol/L (ref 135–145)
Total Bilirubin: 0.6 mg/dL (ref 0.3–1.2)
Total Protein: 8 g/dL (ref 6.5–8.1)

## 2017-01-30 LAB — URINALYSIS, COMPLETE (UACMP) WITH MICROSCOPIC
Bacteria, UA: NONE SEEN
Bilirubin Urine: NEGATIVE
Glucose, UA: NEGATIVE mg/dL
Hgb urine dipstick: NEGATIVE
Ketones, ur: NEGATIVE mg/dL
Leukocytes, UA: NEGATIVE
Nitrite: NEGATIVE
Protein, ur: NEGATIVE mg/dL
RBC / HPF: NONE SEEN RBC/hpf (ref 0–5)
Specific Gravity, Urine: 1.016 (ref 1.005–1.030)
pH: 7 (ref 5.0–8.0)

## 2017-01-30 LAB — CBC
HCT: 44 % (ref 35.0–47.0)
Hemoglobin: 15.1 g/dL (ref 12.0–16.0)
MCH: 30.7 pg (ref 26.0–34.0)
MCHC: 34.3 g/dL (ref 32.0–36.0)
MCV: 89.4 fL (ref 80.0–100.0)
Platelets: 351 10*3/uL (ref 150–440)
RBC: 4.93 MIL/uL (ref 3.80–5.20)
RDW: 13.3 % (ref 11.5–14.5)
WBC: 10.1 10*3/uL (ref 3.6–11.0)

## 2017-01-30 LAB — CHLAMYDIA/NGC RT PCR (ARMC ONLY)
CHLAMYDIA TR: NOT DETECTED
N gonorrhoeae: NOT DETECTED

## 2017-01-30 LAB — POCT PREGNANCY, URINE: Preg Test, Ur: NEGATIVE

## 2017-01-30 LAB — WET PREP, GENITAL
SPERM: NONE SEEN
Trich, Wet Prep: NONE SEEN
Yeast Wet Prep HPF POC: NONE SEEN

## 2017-01-30 LAB — LIPASE, BLOOD: LIPASE: 20 U/L (ref 11–51)

## 2017-01-30 MED ORDER — NAPROXEN 500 MG PO TABS: 500.0000 mg | ORAL_TABLET | Freq: Two times a day (BID) | ORAL | 0 refills | Status: DC

## 2017-01-30 MED ORDER — OXYCODONE-ACETAMINOPHEN 5-325 MG PO TABS
1.0000 | ORAL_TABLET | Freq: Once | ORAL | Status: AC
Start: 2017-01-30 — End: 2017-01-30
  Administered 2017-01-30: 1 via ORAL
  Filled 2017-01-30: qty 1

## 2017-01-30 MED ORDER — METRONIDAZOLE 500 MG PO TABS: 500.0000 mg | ORAL_TABLET | Freq: Two times a day (BID) | ORAL | 0 refills | Status: AC

## 2017-01-30 MED ORDER — HYDROCODONE-ACETAMINOPHEN 5-325 MG PO TABS: 1.0000 | ORAL_TABLET | ORAL | 0 refills | Status: DC | PRN

## 2017-01-30 MED ORDER — PROMETHAZINE HCL 12.5 MG PO TABS: 12.5000 mg | ORAL_TABLET | Freq: Four times a day (QID) | ORAL | 0 refills | Status: DC | PRN

## 2017-01-30 NOTE — ED Notes (Signed)
Patient transported to Ultrasound 

## 2017-01-30 NOTE — ED Triage Notes (Signed)
Pt states hx of R ovarian cyst, states pain started last Sunday 01/23/2017. Pt states pain has been increasing x 1 week. Denies N/V/D, denies any vaginal bleeding at this time.

## 2017-01-30 NOTE — ED Notes (Addendum)
Right sided abdominal pain, hx of ovarian cyst to right side. Pt c/o pain since Wednesday night. Pt alert and oriented X4, active, cooperative, pt in NAD. RR even and unlabored, color WNL.  Mild vaginal discharge reported

## 2017-01-30 NOTE — ED Notes (Signed)
ED Provider at bedside. 

## 2017-01-30 NOTE — ED Provider Notes (Signed)
[facility] Emergency Department Provider Note    First MD Initiated Contact with Patient 01/30/17 1402     (approximate)  I have reviewed the triage vital signs and the nursing notes.   HISTORY  Chief Complaint Abdominal Pain    HPI Sherry Watkins is a 36 y.o. female with a history of right ovarian cyst presents with worsening cramping pain since Wednesday night. States that similar in nature but worse. Also complaining of vaginal discharge. No nausea or vomiting no fevers. Currently rates the pain has 7 out of 10 in severity.   Past Medical History:  Diagnosis Date  . GERD (gastroesophageal reflux disease)   . Headache   . Ovarian cyst    History reviewed. No pertinent family history. Past Surgical History:  Procedure Laterality Date  . ABLATION    . CESAREAN SECTION    . HYSTEROSCOPY WITH NOVASURE N/A 05/27/2015   Procedure: HYSTEROSCOPY WITH NOVASURE;  Surgeon: Aletha Halim, MD;  Location: [facility] ORS;  Service: Gynecology;  Laterality: N/A;  . TUBAL LIGATION     There are no active problems to display for this patient.     Prior to Admission medications   Medication Sig Start Date End Date Taking? Authorizing Provider  docusate sodium (COLACE) 100 MG capsule Take 1 capsule (100 mg total) by mouth 2 (two) times daily. 05/27/15   Aletha Halim, MD  famotidine (PEPCID) 20 MG tablet Take 1 tablet (20 mg total) by mouth 2 (two) times daily. 06/03/15   Jenise V Bacon Menshew, PA-C  HYDROcodone-acetaminophen (NORCO/VICODIN) 5-325 MG per tablet Take 1 tablet by mouth every 6 (six) hours as needed for moderate pain. 05/27/15   Aletha Halim, MD  ibuprofen (ADVIL,MOTRIN) 200 MG tablet Take 1 tablet (200 mg total) by mouth every 6 (six) hours. 200-600mg  po q6h x 24hrs and the q6h prn pain 05/27/15   Aletha Halim, MD  Multiple Vitamin (MULTIVITAMIN) capsule Take 1 capsule by mouth daily.    Historical Provider, MD  ranitidine (ZANTAC) 150 MG tablet  Take 150 mg by mouth 2 (two) times daily.    Historical Provider, MD  traMADol (ULTRAM) 50 MG tablet Take 1 tablet (50 mg total) by mouth every 6 (six) hours as needed. 11/02/16 11/02/17  Harvest Dark, MD    Allergies Patient has no known allergies.    Social History Social History  Substance Use Topics  . Smoking status: Never Smoker  . Smokeless tobacco: Never Used  . Alcohol use No    Review of Systems Patient denies headaches, rhinorrhea, blurry vision, numbness, shortness of breath, chest pain, edema, cough, abdominal pain, nausea, vomiting, diarrhea, dysuria, fevers, rashes or hallucinations unless otherwise stated above in HPI. ____________________________________________   PHYSICAL EXAM:  VITAL SIGNS: Vitals:   01/30/17 1226  BP: (!) 125/57  Pulse: 70  Resp: 18  Temp: 98.2 F (36.8 C)    Constitutional: Alert and oriented. Well appearing and in no acute distress. Eyes: Conjunctivae are normal. PERRL. EOMI. Head: Atraumatic. Nose: No congestion/rhinnorhea. Mouth/Throat: Mucous membranes are moist.  Oropharynx non-erythematous. Neck: No stridor. Painless ROM. No cervical spine tenderness to palpation Hematological/Lymphatic/Immunilogical: No cervical lymphadenopathy. Cardiovascular: Normal rate, regular rhythm. Grossly normal heart sounds.  Good peripheral circulation. Respiratory: Normal respiratory effort.  No retractions. Lungs CTAB. Gastrointestinal: Soft and nontender. No distention. No abdominal bruits. No CVA tenderness. Genitourinary: not CMT,  Irritated appearing cervix, no adnex ttd, no purulent discharge Musculoskeletal: No lower extremity tenderness nor edema.  No joint  effusions. Neurologic:  Normal speech and language. No gross focal neurologic deficits are appreciated. No gait instability. Skin:  Skin is warm, dry and intact. No rash noted. Psychiatric: Mood and affect are normal. Speech and behavior are  normal.  ____________________________________________   LABS (all labs ordered are listed, but only abnormal results are displayed)  Results for orders placed or performed during the hospital encounter of 01/30/17 (from the past 24 hour(s))  Lipase, blood     Status: None   Collection Time: 01/30/17 12:23 PM  Result Value Ref Range   Lipase 20 11 - 51 U/L  Comprehensive metabolic panel     Status: Abnormal   Collection Time: 01/30/17 12:23 PM  Result Value Ref Range   Sodium 138 135 - 145 mmol/L   Potassium 4.0 3.5 - 5.1 mmol/L   Chloride 105 101 - 111 mmol/L   CO2 27 22 - 32 mmol/L   Glucose, Bld 105 (H) 65 - 99 mg/dL   BUN 7 6 - 20 mg/dL   Creatinine, Ser 0.62 0.44 - 1.00 mg/dL   Calcium 9.2 8.9 - 10.3 mg/dL   Total Protein 8.0 6.5 - 8.1 g/dL   Albumin 3.9 3.5 - 5.0 g/dL   AST 23 15 - 41 U/L   ALT 21 14 - 54 U/L   Alkaline Phosphatase 51 38 - 126 U/L   Total Bilirubin 0.6 0.3 - 1.2 mg/dL   GFR calc non Af Amer >60 >60 mL/min   GFR calc Af Amer >60 >60 mL/min   Anion gap 6 5 - 15  CBC     Status: None   Collection Time: 01/30/17 12:23 PM  Result Value Ref Range   WBC 10.1 3.6 - 11.0 K/uL   RBC 4.93 3.80 - 5.20 MIL/uL   Hemoglobin 15.1 12.0 - 16.0 g/dL   HCT 44.0 35.0 - 47.0 %   MCV 89.4 80.0 - 100.0 fL   MCH 30.7 26.0 - 34.0 pg   MCHC 34.3 32.0 - 36.0 g/dL   RDW 13.3 11.5 - 14.5 %   Platelets 351 150 - 440 K/uL  Urinalysis, Complete w Microscopic     Status: Abnormal   Collection Time: 01/30/17 12:23 PM  Result Value Ref Range   Color, Urine YELLOW (A) YELLOW   APPearance CLEAR (A) CLEAR   Specific Gravity, Urine 1.016 1.005 - 1.030   pH 7.0 5.0 - 8.0   Glucose, UA NEGATIVE NEGATIVE mg/dL   Hgb urine dipstick NEGATIVE NEGATIVE   Bilirubin Urine NEGATIVE NEGATIVE   Ketones, ur NEGATIVE NEGATIVE mg/dL   Protein, ur NEGATIVE NEGATIVE mg/dL   Nitrite NEGATIVE NEGATIVE   Leukocytes, UA NEGATIVE NEGATIVE   RBC / HPF NONE SEEN 0 - 5 RBC/hpf   WBC, UA 0-5 0 - 5  WBC/hpf   Bacteria, UA NONE SEEN NONE SEEN   Squamous Epithelial / LPF 0-5 (A) NONE SEEN   Mucous PRESENT   Pregnancy, urine POC     Status: None   Collection Time: 01/30/17 12:43 PM  Result Value Ref Range   Preg Test, Ur NEGATIVE NEGATIVE   ____________________________________________  EKG_________________________________  RADIOLOGY  I personally reviewed all radiographic images ordered to evaluate for the above acute complaints and reviewed radiology reports and findings.  These findings were personally discussed with the patient.  Please see medical record for radiology report.  ____________________________________________   PROCEDURES  Procedure(s) performed:  Procedures    Critical Care performed: no ____________________________________________   INITIAL IMPRESSION /  ASSESSMENT AND PLAN / ED COURSE  Pertinent labs & imaging results that were available during my care of the patient were reviewed by me and considered in my medical decision making (see chart for details).  DDX: pid, std, torsion, prgnancy, mass,  KAILAN CARMEN is a 36 y.o. who presents to the ED with Pelvic pain as described above. Her abdominal exam is soft and benign. She is afebrile do not feel this is clinically consistent with acute appendicitis. Ultrasound ordered to evaluate for evidence of ovarian cysts or torsion. Ultrasound shows no evidence of sepsis. There is evidence of a small fibroid which could be causing the patient's pain. Her clinical exam is not consistent with PID but she does have evidence of bacterial vaginosis. Pain improved in the ER. Repeat abdominal exam was soft and benign. Do not feel patient is stable for further outpatient management.  Patient was able to tolerate PO and was able to ambulate with a steady gait.  Have discussed with the patient and available family all diagnostics and treatments performed thus far and all questions were answered to the best of my ability. The  patient demonstrates understanding and agreement with plan.    ____________________________________________   FINAL CLINICAL IMPRESSION(S) / ED DIAGNOSES  Final diagnoses:  RLQ abdominal pain  Uterine leiomyoma, unspecified location  Bacterial vaginosis      NEW MEDICATIONS STARTED DURING THIS VISIT:  New Prescriptions   No medications on file     Note:  This document was prepared using Dragon voice recognition software and may include unintentional dictation errors.    Merlyn Lot, MD 01/30/17 1705

## 2017-01-30 NOTE — Discharge Instructions (Signed)

## 2017-05-24 ENCOUNTER — Telehealth: Payer: Self-pay | Admitting: Obstetrics & Gynecology

## 2017-05-24 NOTE — Telephone Encounter (Signed)
Pt is being referred by [facility] for Pelvic pain.Called and left voicemail for pt to call back to be schedule

## 2017-05-27 NOTE — Telephone Encounter (Signed)
Pt is schedule 06/13/17 with AMS

## 2017-06-13 ENCOUNTER — Encounter: Payer: Self-pay | Admitting: Obstetrics and Gynecology

## 2017-06-13 ENCOUNTER — Ambulatory Visit (INDEPENDENT_AMBULATORY_CARE_PROVIDER_SITE_OTHER): Payer: Self-pay | Admitting: Obstetrics and Gynecology

## 2017-06-13 VITALS — BP 118/82 | HR 73 | Ht 62.0 in | Wt 230.0 lb

## 2017-06-13 DIAGNOSIS — G8929 Other chronic pain: Secondary | ICD-10-CM

## 2017-06-13 DIAGNOSIS — R102 Pelvic and perineal pain: Secondary | ICD-10-CM

## 2017-06-13 NOTE — Progress Notes (Signed)
Obstetrics & Gynecology Office Visit   Chief Complaint:  Chief Complaint  Patient presents with  . Referral by [facility]    Fibroids, right side    History of Present Illness: 36 yo G4P4 presenting for evaluation of chronic pelvic pain which has been presented for almost 4 year per her reports.  Work up including multiple imaging studies have not revealed a definitive cause.  The patient underwent endometrial ablation with Dr. Ilda Basset on 05/27/2015, she has not had any bleeding since the procedure.  Current form of contraception is bilateral tubal ligation done in 2002.  The pain she currently experiencing is located in the right pelvis and groin with radiation into the right leg.  It is daily not cyclical, with intermittent exacerbations.  At it's worst pain is 8/10.  Most recent imaging 01/30/17 negative except for 1.5cm intramural fibroid, GC/CT cultures also negative.    Review of Systems: 10 point review of systems negative unless noted in HPI  Past Medical History:  Past Medical History:  Diagnosis Date  . GERD (gastroesophageal reflux disease)   . Headache   . Ovarian cyst     Past Surgical History:  Past Surgical History:  Procedure Laterality Date  . ABLATION    . CESAREAN SECTION    . HYSTEROSCOPY WITH NOVASURE N/A 05/27/2015   Procedure: HYSTEROSCOPY WITH NOVASURE;  Surgeon: Aletha Halim, MD;  Location: [facility] ORS;  Service: Gynecology;  Laterality: N/A;  . TUBAL LIGATION      Gynecologic History: No LMP recorded. Patient has had an ablation.  Obstetric History: G54P4  Family History:  History reviewed. No pertinent family history.  Social History:  Social History   Social History  . Marital status: Single    Spouse name: N/A  . Number of children: N/A  . Years of education: N/A   Occupational History  . Not on file.   Social History Main Topics  . Smoking status: Never Smoker  . Smokeless tobacco: Never Used  . Alcohol use No  . Drug use: No    . Sexual activity: Yes    Birth control/ protection: None   Other Topics Concern  . Not on file   Social History Narrative  . No narrative on file    Allergies:  No Known Allergies  Medications: Prior to Admission medications   Medication Sig Start Date End Date Taking? Authorizing Provider  HYDROcodone-acetaminophen (NORCO) 5-325 MG tablet Take 1 tablet by mouth every 4 (four) hours as needed for moderate pain. 01/30/17  Yes Merlyn Lot, MD  naproxen (NAPROSYN) 500 MG tablet Take 1 tablet (500 mg total) by mouth 2 (two) times daily with a meal. 01/30/17 01/30/18 Yes Merlyn Lot, MD    Physical Exam Vitals:  Vitals:   06/13/17 1114  BP: 118/82  Pulse: 73   No LMP recorded. Patient has had an ablation.  General: NAD HEENT: normocephalic, anicteric Pulmonary: No increased work of breathing Extremities: no edema, erythema, or tenderness Neurologic: Grossly intact Psychiatric: mood appropriate, affect full  Female chaperone present for pelvic and breast  portions of the physical exam  Assessment: 36 y.o. G4P4 chronic pelvic pain  Plan: Problem List Items Addressed This Visit    None    Visit Diagnoses    Chronic pelvic pain in female    -  Primary      - Normal [place] 01/30/17 and 11/02/16 - No bleeding since ablation 2015 - Post for diagnostic scope given chronic pelvic pain  of longstanding duration and no definitive cause to rule out  - DDx includes endometriosis but given radiation into hip MSK etiology also possible - A total of 15 minutes were spent in face-to-face contact with the patient during this encounter with over half of that time devoted to counseling and coordination of care.

## 2017-06-14 ENCOUNTER — Telehealth: Payer: Self-pay | Admitting: Obstetrics and Gynecology

## 2017-06-14 NOTE — Telephone Encounter (Signed)
L/m with daughter for patient to return call. No additional info given.

## 2017-06-14 NOTE — Telephone Encounter (Signed)
-----   Message from Malachy Mood, MD sent at 06/13/2017  9:04 PM EDT ----- Surgery Date:  Next 2-8 weeks  LOS: outpatient  Surgery Booking Request Patient Full Name: SIEDAH SEDOR MRN: [id number]  DOB: [birth year]  Surgeon: Malachy Mood, MD  Requested Surgery Date and Time: Chronic pelvic pain Primary Diagnosis and Code:  Secondary Diagnosis and Code:  Surgical Procedure: Diagnostic laparoscopy L&D Notification:N/A Admission Status: same day surgery Length of Surgery: 1h Special Case Needs: none H&P:  (date) Phone Interview or Office Pre-Admit: either Interpreter: none Language: english Medical Clearance: none Special Scheduling Instructions: none

## 2017-06-15 NOTE — Telephone Encounter (Signed)
Attempted to contact the patient at the same phone# I called yesterday when I left a message with the daughter (Home# [phone]) and was told today "Kayleann does not have this number". When I tried to confirm there was no one named [place] there, the lady hung up. Lmtrc @ mobile#.

## 2017-06-15 NOTE — Telephone Encounter (Signed)
Faxed number for work fax [phone] . Per pt for needing paper work for [facility] for surgery. Please call patient

## 2017-06-17 NOTE — Telephone Encounter (Signed)
Patient returned call and is scheduled for surgery on 07/07/17. Patient needs FMLA paperwork faxed so that she can take time off for the surgery. Patient's phone# is [phone].

## 2017-06-17 NOTE — Telephone Encounter (Signed)
Left Message for pt letting her know that we have not received paperwork about her FMLA. Told pt to call office back if she had any questions.

## 2017-06-29 ENCOUNTER — Telehealth: Payer: Self-pay | Admitting: Obstetrics and Gynecology

## 2017-06-29 NOTE — Telephone Encounter (Signed)
I spoke to the patient, who will come by the office tomorrow to fill out the two forms and pay for the FMLA paperwork to be processed.

## 2017-06-30 ENCOUNTER — Encounter
Admission: RE | Admit: 2017-06-30 | Discharge: 2017-06-30 | Disposition: A | Discharge status: Home / Self Care | Payer: Self-pay | Source: Ambulatory Visit | Attending: Obstetrics and Gynecology | Admitting: Obstetrics and Gynecology

## 2017-06-30 HISTORY — DX: Depression, unspecified: F32.A

## 2017-06-30 HISTORY — DX: Adverse effect of unspecified anesthetic, initial encounter: T41.45XA

## 2017-06-30 HISTORY — DX: Other complications of anesthesia, initial encounter: T88.59XA

## 2017-06-30 HISTORY — DX: Major depressive disorder, single episode, unspecified: F32.9

## 2017-06-30 NOTE — Patient Instructions (Signed)
  Your procedure is scheduled on: 07-07-17 Report to Same Day Surgery 2nd floor medical mall [facility] Entrance-take elevator on left to 2nd floor.  Check in with surgery information desk.) To find out your arrival time please call [phone] between 1PM - 3PM on 07-06-17  Remember: Instructions that are not followed completely may result in serious medical risk, up to and including death, or upon the discretion of your surgeon and anesthesiologist your surgery may need to be rescheduled.    _x___ 1. Do not eat food or drink liquids after midnight. No gum chewing or hard candies.     __x__ 2. No Alcohol for 24 hours before or after surgery.   __x__3. No Smoking for 24 prior to surgery.   ____  4. Bring all medications with you on the day of surgery if instructed.    __x__ 5. Notify your doctor if there is any change in your medical condition     (cold, fever, infections).     Do not wear jewelry, make-up, hairpins, clips or nail polish.  Do not wear lotions, powders, or perfumes. You may wear deodorant.  Do not shave 48 hours prior to surgery. Men may shave face and neck.  Do not bring valuables to the hospital.    [facility] is not responsible for any belongings or valuables.               Contacts, dentures or bridgework may not be worn into surgery.  Leave your suitcase in the car. After surgery it may be brought to your room.  For patients admitted to the hospital, discharge time is determined by your treatment team.   Patients discharged the day of surgery will not be allowed to drive home.  You will need someone to drive you home and stay with you the night of your procedure.    Please read over the following fact sheets that you were given:   [facility] Preparing for Surgery and or MRSA Information   _x___ Take anti-hypertensive (unless it includes a diuretic), cardiac, seizure, asthma,     anti-reflux and psychiatric medicines. These include:  1.  NONE  2.  3.  4.  5.  6.  ____Fleets enema or Magnesium Citrate as directed.   _x___ Use CHG Soap or sage wipes as directed on instruction sheet   ____ Use inhalers on the day of surgery and bring to hospital day of surgery  ____ Stop Metformin and Janumet 2 days prior to surgery.    ____ Take 1/2 of usual insulin dose the night before surgery and none on the morning surgery.   ____ Follow recommendations from Cardiologist, Pulmonologist or PCP regarding stopping Aspirin, Coumadin, Pllavix ,Eliquis, Effient, or Pradaxa, and Pletal.  X____Stop Anti-inflammatories such as Advil, Aleve, Ibuprofen, Motrin, Naproxen, Naprosyn, Goodies powders or aspirin products NOW-OK to take Tylenol    ____ Stop supplements until after surgery.   ____ Bring C-Pap to the hospital.

## 2017-07-01 ENCOUNTER — Emergency Department
Admission: EM | Admit: 2017-07-01 | Discharge: 2017-07-01 | Disposition: A | Discharge status: Home / Self Care | Payer: Self-pay | Attending: Emergency Medicine | Admitting: Emergency Medicine

## 2017-07-01 DIAGNOSIS — Z87891 Personal history of nicotine dependence: Secondary | ICD-10-CM | POA: Insufficient documentation

## 2017-07-01 DIAGNOSIS — B001 Herpesviral vesicular dermatitis: Secondary | ICD-10-CM | POA: Insufficient documentation

## 2017-07-01 DIAGNOSIS — Z79899 Other long term (current) drug therapy: Secondary | ICD-10-CM | POA: Insufficient documentation

## 2017-07-01 NOTE — ED Triage Notes (Signed)
Pt states "i want to make sure this ins't a fever blister on my lip". Pt with herpetic like lesion noted to right upper lateral lip. Pt denies pain but states area itches.

## 2017-07-01 NOTE — ED Provider Notes (Signed)
[facility] Emergency Department Provider Note  ____________________________________________  Time seen: Approximately 10:44 PM  I have reviewed the triage vital signs and the nursing notes.   HISTORY  Chief Complaint Oral Swelling    HPI Sherry Watkins is a 36 y.o. female presenting to the emergency department with a fever blister of the upper lip that has been apparent for one day. Patient has been using lip balm but has attempted no other alleviating measures. Patient reports that her lip was pruritic before lesion appeared. Patient states that she has been more stressed than usual. She denies fever and chills.   Past Medical History:  Diagnosis Date  . Complication of anesthesia   . Depression   . GERD (gastroesophageal reflux disease)    NO MEDS  . Headache    FREQUENTLY  . Ovarian cyst     There are no active problems to display for this patient.   Past Surgical History:  Procedure Laterality Date  . ABLATION    . CESAREAN SECTION    . HYSTEROSCOPY WITH NOVASURE N/A 05/27/2015   Procedure: HYSTEROSCOPY WITH NOVASURE;  Surgeon: Aletha Halim, MD;  Location: [facility] ORS;  Service: Gynecology;  Laterality: N/A;  . TUBAL LIGATION      Prior to Admission medications   Medication Sig Start Date End Date Taking? Authorizing Provider  HYDROcodone-acetaminophen (NORCO) 5-325 MG tablet Take 1 tablet by mouth every 4 (four) hours as needed for moderate pain. Patient not taking: Reported on 06/30/2017 01/30/17   Merlyn Lot, MD  naproxen (NAPROSYN) 500 MG tablet Take 1 tablet (500 mg total) by mouth 2 (two) times daily with a meal. Patient not taking: Reported on 06/30/2017 01/30/17 01/30/18  Merlyn Lot, MD    Allergies Patient has no known allergies.  No family history on file.  Social History Social History  Substance Use Topics  . Smoking status: Former Smoker    Years: 15.00    Types: Cigarettes    Quit date: 06/30/2004  . Smokeless  tobacco: Never Used  . Alcohol use Yes     Comment: OCC     Review of Systems  Constitutional: No fever/chills Eyes: No visual changes. No discharge ENT: No upper respiratory complaints. Cardiovascular: no chest pain. Respiratory: no cough. No SOB. Skin: Patient has fever blister of upper lip. Neurological: Negative for headaches, focal weakness or numbness.  ____________________________________________   PHYSICAL EXAM:  VITAL SIGNS: ED Triage Vitals  Enc Vitals Group     BP 07/01/17 2149 134/63     Pulse Rate 07/01/17 2148 90     Resp 07/01/17 2148 16     Temp 07/01/17 2148 98.6 F (37 C)     Temp Source 07/01/17 2148 Oral     SpO2 07/01/17 2148 96 %     Weight 07/01/17 2149 224 lb (101.6 kg)     Height 07/01/17 2149 5\' 2"  (1.575 m)     Head Circumference --      Peak Flow --      Pain Score 07/01/17 2148 0     Pain Loc --      Pain Edu? --      Excl. in [place]? --      Constitutional: Alert and oriented. Well appearing and in no acute distress. Eyes: Conjunctivae are normal. PERRL. EOMI. Head: Atraumatic. Cardiovascular: Normal rate, regular rhythm. Normal S1 and S2.  Good peripheral circulation. Respiratory: Normal respiratory effort without tachypnea or retractions. Lungs CTAB. Good air entry to  the bases with no decreased or absent breath sounds. Musculoskeletal: Full range of motion to all extremities. No gross deformities appreciated. Neurologic:  Normal speech and language. No gross focal neurologic deficits are appreciated.  Skin:Upper lip has a 0.25 cm, erythematous region with vesicular formation. Psychiatric: Mood and affect are normal. Speech and behavior are normal. Patient exhibits appropriate insight and judgement.   ____________________________________________   LABS (all labs ordered are listed, but only abnormal results are displayed)  Labs Reviewed - No data to  display ____________________________________________  EKG   ____________________________________________  RADIOLOGY   No results found.  ____________________________________________    PROCEDURES  Procedure(s) performed:    Procedures    Medications - No data to display   ____________________________________________   INITIAL IMPRESSION / ASSESSMENT AND PLAN / ED COURSE  Pertinent labs & imaging results that were available during my care of the patient were reviewed by me and considered in my medical decision making (see chart for details).  Review of the [place] CSRS was performed in accordance of the [place] prior to dispensing any controlled drugs.    Assessment and plan Fever blister Patient presents to the emergency department with a fever blister of the upper lip. Supportive measures were encouraged. Patient was advised to follow-up with primary care as needed.   ____________________________________________  FINAL CLINICAL IMPRESSION(S) / ED DIAGNOSES  Final diagnoses:  Fever blister      NEW MEDICATIONS STARTED DURING THIS VISIT:  New Prescriptions   No medications on file        This chart was dictated using voice recognition software/Dragon. Despite best efforts to proofread, errors can occur which can change the meaning. Any change was purely unintentional.    Lannie Fields, PA-C 07/01/17 2250    Carrie Mew, MD 07/04/17 [phone]

## 2017-07-04 ENCOUNTER — Telehealth: Payer: Self-pay | Admitting: Obstetrics and Gynecology

## 2017-07-04 NOTE — Telephone Encounter (Signed)
Patient called to say the FMLA paperwork faxed to her employer indicated the dates she would be out of work as 07/07/17 - 08/07/17. Per patient, this needs to be corrected as she is planning to return to work on 07/11/17. Fax# [phone].

## 2017-07-07 ENCOUNTER — Encounter
Admission: RE | Disposition: A | Discharge status: Home / Self Care | Payer: Self-pay | Source: Ambulatory Visit | Attending: Obstetrics and Gynecology

## 2017-07-07 ENCOUNTER — Ambulatory Visit: Payer: Self-pay | Admitting: Anesthesiology

## 2017-07-07 ENCOUNTER — Ambulatory Visit
Admission: RE | Admit: 2017-07-07 | Discharge: 2017-07-07 | Disposition: A | Discharge status: Home / Self Care | Payer: Self-pay | Source: Ambulatory Visit | Attending: Obstetrics and Gynecology | Admitting: Obstetrics and Gynecology

## 2017-07-07 DIAGNOSIS — Z9889 Other specified postprocedural states: Secondary | ICD-10-CM

## 2017-07-07 DIAGNOSIS — F329 Major depressive disorder, single episode, unspecified: Secondary | ICD-10-CM | POA: Insufficient documentation

## 2017-07-07 DIAGNOSIS — G8929 Other chronic pain: Secondary | ICD-10-CM

## 2017-07-07 DIAGNOSIS — N83209 Unspecified ovarian cyst, unspecified side: Secondary | ICD-10-CM | POA: Insufficient documentation

## 2017-07-07 DIAGNOSIS — E669 Obesity, unspecified: Secondary | ICD-10-CM | POA: Insufficient documentation

## 2017-07-07 DIAGNOSIS — D251 Intramural leiomyoma of uterus: Secondary | ICD-10-CM | POA: Insufficient documentation

## 2017-07-07 DIAGNOSIS — Z6841 Body Mass Index (BMI) 40.0 and over, adult: Secondary | ICD-10-CM | POA: Insufficient documentation

## 2017-07-07 DIAGNOSIS — K219 Gastro-esophageal reflux disease without esophagitis: Secondary | ICD-10-CM | POA: Insufficient documentation

## 2017-07-07 DIAGNOSIS — Z87891 Personal history of nicotine dependence: Secondary | ICD-10-CM | POA: Insufficient documentation

## 2017-07-07 HISTORY — PX: LAPAROSCOPY: SHX197

## 2017-07-07 LAB — POCT PREGNANCY, URINE: PREG TEST UR: NEGATIVE

## 2017-07-07 SURGERY — LAPAROSCOPY, DIAGNOSTIC
Anesthesia: General | Wound class: Clean Contaminated

## 2017-07-07 MED ORDER — BUPIVACAINE HCL 0.5 % IJ SOLN
INTRAMUSCULAR | Status: DC | PRN
  Administered 2017-07-07: 10 mL

## 2017-07-07 MED ORDER — OXYCODONE-ACETAMINOPHEN 5-325 MG PO TABS
ORAL_TABLET | ORAL | Status: AC
  Administered 2017-07-07: 1 via ORAL
  Filled 2017-07-07: qty 1

## 2017-07-07 MED ORDER — FENTANYL CITRATE (PF) 100 MCG/2ML IJ SOLN
25.0000 ug | INTRAMUSCULAR | Status: AC | PRN
  Administered 2017-07-07 (×6): 25 ug via INTRAVENOUS

## 2017-07-07 MED ORDER — FAMOTIDINE 20 MG PO TABS
20.0000 mg | ORAL_TABLET | Freq: Once | ORAL | Status: AC
  Administered 2017-07-07: 20 mg via ORAL
  Filled 2017-07-07: qty 1

## 2017-07-07 MED ORDER — LIDOCAINE HCL (PF) 2 % IJ SOLN
INTRAMUSCULAR | Status: AC
  Filled 2017-07-07: qty 2

## 2017-07-07 MED ORDER — IBUPROFEN 600 MG PO TABS: 600.0000 mg | ORAL_TABLET | Freq: Four times a day (QID) | ORAL | 3 refills | Status: DC | PRN

## 2017-07-07 MED ORDER — PROPOFOL 10 MG/ML IV BOLUS
INTRAVENOUS | Status: AC
  Filled 2017-07-07: qty 20

## 2017-07-07 MED ORDER — MIDAZOLAM HCL 2 MG/2ML IJ SOLN
INTRAMUSCULAR | Status: AC
  Filled 2017-07-07: qty 2

## 2017-07-07 MED ORDER — FENTANYL CITRATE (PF) 100 MCG/2ML IJ SOLN
INTRAMUSCULAR | Status: AC
  Filled 2017-07-07: qty 2

## 2017-07-07 MED ORDER — PROPOFOL 10 MG/ML IV BOLUS
INTRAVENOUS | Status: DC | PRN
  Administered 2017-07-07: 160 mg via INTRAVENOUS

## 2017-07-07 MED ORDER — OXYCODONE-ACETAMINOPHEN 5-325 MG PO TABS
1.0000 | ORAL_TABLET | Freq: Once | ORAL | Status: AC
  Administered 2017-07-07: 1 via ORAL

## 2017-07-07 MED ORDER — ROCURONIUM BROMIDE 100 MG/10ML IV SOLN
INTRAVENOUS | Status: DC | PRN
  Administered 2017-07-07: 30 mg via INTRAVENOUS
  Administered 2017-07-07 (×2): 10 mg via INTRAVENOUS

## 2017-07-07 MED ORDER — DEXAMETHASONE SODIUM PHOSPHATE 10 MG/ML IJ SOLN
INTRAMUSCULAR | Status: DC | PRN
  Administered 2017-07-07: 10 mg via INTRAVENOUS

## 2017-07-07 MED ORDER — LACTATED RINGERS IV SOLN
INTRAVENOUS | Status: DC
  Administered 2017-07-07 (×2): via INTRAVENOUS

## 2017-07-07 MED ORDER — FAMOTIDINE 20 MG PO TABS
ORAL_TABLET | ORAL | Status: AC
  Administered 2017-07-07: 20 mg via ORAL
  Filled 2017-07-07: qty 1

## 2017-07-07 MED ORDER — ONDANSETRON HCL 4 MG/2ML IJ SOLN: 4.0000 mg | Freq: Once | INTRAMUSCULAR | Status: DC | PRN

## 2017-07-07 MED ORDER — SUGAMMADEX SODIUM 200 MG/2ML IV SOLN
INTRAVENOUS | Status: DC | PRN
  Administered 2017-07-07: 203.2 mg via INTRAVENOUS

## 2017-07-07 MED ORDER — FENTANYL CITRATE (PF) 100 MCG/2ML IJ SOLN
INTRAMUSCULAR | Status: DC | PRN
  Administered 2017-07-07: 100 ug via INTRAVENOUS
  Administered 2017-07-07 (×2): 50 ug via INTRAVENOUS

## 2017-07-07 MED ORDER — MIDAZOLAM HCL 2 MG/2ML IJ SOLN
INTRAMUSCULAR | Status: DC | PRN
  Administered 2017-07-07: 2 mg via INTRAVENOUS

## 2017-07-07 MED ORDER — LIDOCAINE HCL (CARDIAC) 20 MG/ML IV SOLN
INTRAVENOUS | Status: DC | PRN
  Administered 2017-07-07: 40 mg via INTRAVENOUS

## 2017-07-07 MED ORDER — ROCURONIUM BROMIDE 50 MG/5ML IV SOLN
INTRAVENOUS | Status: AC
  Filled 2017-07-07: qty 1

## 2017-07-07 MED ORDER — SUGAMMADEX SODIUM 200 MG/2ML IV SOLN
INTRAVENOUS | Status: AC
  Filled 2017-07-07: qty 2

## 2017-07-07 MED ORDER — ONDANSETRON HCL 4 MG/2ML IJ SOLN
INTRAMUSCULAR | Status: DC | PRN
  Administered 2017-07-07: 4 mg via INTRAVENOUS

## 2017-07-07 MED ORDER — OXYCODONE-ACETAMINOPHEN 5-325 MG PO TABS: 1.0000 | ORAL_TABLET | ORAL | 0 refills | Status: DC | PRN

## 2017-07-07 MED ORDER — FAMOTIDINE 20 MG PO TABS
20.0000 mg | ORAL_TABLET | Freq: Once | ORAL | Status: AC
  Administered 2017-07-07: 20 mg via ORAL

## 2017-07-07 SURGICAL SUPPLY — 31 items
BLADE SURG SZ11 CARB STEEL (BLADE) ×3 IMPLANT
CANISTER SUCT 1200ML W/VALVE (MISCELLANEOUS) ×3 IMPLANT
CATH ROBINSON RED A/P 16FR (CATHETERS) ×3 IMPLANT
CHLORAPREP W/TINT 26ML (MISCELLANEOUS) ×3 IMPLANT
DERMABOND ADVANCED (GAUZE/BANDAGES/DRESSINGS) ×2
DERMABOND ADVANCED .7 DNX12 (GAUZE/BANDAGES/DRESSINGS) ×1 IMPLANT
DRSG TEGADERM 2-3/8X2-3/4 SM (GAUZE/BANDAGES/DRESSINGS) ×3 IMPLANT
GAUZE SPONGE NON-WVN 2X2 STRL (MISCELLANEOUS) ×1 IMPLANT
GLOVE BIO SURGEON STRL SZ7 (GLOVE) ×3 IMPLANT
GLOVE INDICATOR 7.5 STRL GRN (GLOVE) ×3 IMPLANT
GOWN STRL REUS W/ TWL LRG LVL3 (GOWN DISPOSABLE) ×2 IMPLANT
GOWN STRL REUS W/TWL LRG LVL3 (GOWN DISPOSABLE) ×4
IRRIGATION STRYKERFLOW (MISCELLANEOUS) IMPLANT
IRRIGATOR STRYKERFLOW (MISCELLANEOUS)
IV LACTATED RINGERS 1000ML (IV SOLUTION) ×3 IMPLANT
KIT RM TURNOVER CYSTO AR (KITS) ×3 IMPLANT
LABEL OR SOLS (LABEL) ×3 IMPLANT
NS IRRIG 500ML POUR BTL (IV SOLUTION) ×3 IMPLANT
PACK GYN LAPAROSCOPIC (MISCELLANEOUS) ×3 IMPLANT
PAD OB MATERNITY 4.3X12.25 (PERSONAL CARE ITEMS) ×3 IMPLANT
PAD PREP 24X41 OB/GYN DISP (PERSONAL CARE ITEMS) ×3 IMPLANT
SCISSORS METZENBAUM CVD 33 (INSTRUMENTS) IMPLANT
SHEARS HARMONIC ACE PLUS 36CM (ENDOMECHANICALS) ×3 IMPLANT
SLEEVE ENDOPATH XCEL 5M (ENDOMECHANICALS) ×3 IMPLANT
SPONGE VERSALON 2X2 STRL (MISCELLANEOUS) ×2
SUT MNCRL AB 4-0 PS2 18 (SUTURE) ×3 IMPLANT
SUT VIC AB 0 CT2 27 (SUTURE) ×3 IMPLANT
SYRINGE 10CC LL (SYRINGE) ×3 IMPLANT
TROCAR ENDO BLADELESS 11MM (ENDOMECHANICALS) IMPLANT
TROCAR XCEL NON-BLD 5MMX100MML (ENDOMECHANICALS) ×3 IMPLANT
TUBING INSUFFLATOR HI FLOW (MISCELLANEOUS) ×3 IMPLANT

## 2017-07-07 NOTE — Op Note (Signed)
Preoperative Diagnosis: 1) 36 y.o. with chronic pelvic pain  Postoperative Diagnosis: 1) 36 y.o. with chronic pelvic pain   Operation Performed: Diagnostic laparoscopy  Indication: 36 y.o. G4P4  with chronic pelvic pain  Surgeon: Malachy Mood, MD  Anesthesia: General  Preoperative Antibiotics: none  Estimated Blood Loss: minimal  IV Fluids: 653mL  Urine Output:: 446mL  Drains or Tubes: none  Implants: none  Specimens Removed: none  Complications: none  Intraoperative Findings: Normal tubes s/p prior partial salpingectomy, normal ovaries.  Filmy adhesins of the uterus and the left anterior abdominal wall which were able to be taken down with a harmonic.  There were dense adhesion of the omentum to the umbilicus.  The adhesions involved only omentum and did not contain bowl.  Patient Condition: stable  Procedure in Detail:  Patient was taken to the operating room where she was administered general anesthesia.  She was positioned in the dorsal lithotomy position utilizing Allen stirups, prepped and draped in the usual sterile fashion.  Prior to proceeding with procedure a time out was performed.  Attention was turned to the patient's pelvis.  A red rubber catheter was used to empty the patient's bladder.  An operative speculum was placed to allow visualization of the cervix.  The anterior lip of the cervix was grasped with a single tooth tenaculum, and a Hulka tenaculum was placed to allow manipulation of the uterus.  The operative speculum and single tooth tenaculum were then removed.  Attention was turned to the patient's abdomen.  The umbilicus was infiltrated with 1% Sensorcaine, before making a stab incision using an 11 blade scalpel.  A 33mm Excel trocar was then used to gain direct entry into the peritoneal cavity utilizing the camera to visualize progress of the trocar during placement.  Once peritoneal entry had been achieved, insufflation was started and pneumoperitoneum  established at a pressure of 20mmHg. One additional 33mm excel trocars was placed under direct visualiztion in the left lower quadrant. General inspection of the abdomen revealed the above noted findings. Some of the omental adhesions to the anterior abdominal wall were taken down with a 73mm harmonic to allow visualization of the upper abdomen.  The adhesions of the uterus to the anterior abdominal wall were also taken down using the 33mm harmonic.  Pneumoperitoneum was evacuated.  The trocars were removed.   All trocar sites were then dressed with surgical skin glue.  The Hulka tenaculum was removed.  Sponge needle and instrument counts were correct time two.  The patient tolerated the procedure well and was taken to the recovery room in stable condition.

## 2017-07-07 NOTE — Anesthesia Post-op Follow-up Note (Signed)
Anesthesia QCDR form completed.        

## 2017-07-07 NOTE — H&P (Signed)
Obstetrics & Gynecology Surgery H&P    Chief Complaint: Scheduled Surgery   History of Present Illness: Patient is a 36 y.o. G4P4 presenting for scheduled diagnostic laparoscopy, for the treatment or further evaluation of chronic pelvic pain.   Prior Treatments prior to proceeding with surgery include: endometrial ablation and imaging studies  Preoperative Endometrial biopsy: N/A s/p endometrial ablation 7/516 Preoperative Ultrasound: 01/30/17 Findings: Negative except small 1.5cm intramural fibroid   Review of Systems:10 point review of systems  Past Medical History:  Past Medical History:  Diagnosis Date  . Complication of anesthesia   . Depression   . GERD (gastroesophageal reflux disease)    NO MEDS  . Headache    FREQUENTLY  . Ovarian cyst     Past Surgical History:  Past Surgical History:  Procedure Laterality Date  . ABLATION    . CESAREAN SECTION    . HYSTEROSCOPY WITH NOVASURE N/A 05/27/2015   Procedure: HYSTEROSCOPY WITH NOVASURE;  Surgeon: Aletha Halim, MD;  Location: [facility] ORS;  Service: Gynecology;  Laterality: N/A;  . TUBAL LIGATION      Family History:  History reviewed. No pertinent family history.  Social History:  Social History   Social History  . Marital status: Single    Spouse name: N/A  . Number of children: N/A  . Years of education: N/A   Occupational History  . Not on file.   Social History Main Topics  . Smoking status: Former Smoker    Years: 15.00    Types: Cigarettes    Quit date: 06/30/2004  . Smokeless tobacco: Never Used  . Alcohol use Yes     Comment: OCC  . Drug use: No  . Sexual activity: Yes    Birth control/ protection: None   Other Topics Concern  . Not on file   Social History Narrative  . No narrative on file    Allergies:  No Known Allergies  Medications: Prior to Admission medications   Medication Sig Start Date End Date Taking? Authorizing Provider  HYDROcodone-acetaminophen (NORCO) 5-325 MG  tablet Take 1 tablet by mouth every 4 (four) hours as needed for moderate pain. Patient not taking: Reported on 06/30/2017 01/30/17   Merlyn Lot, MD  naproxen (NAPROSYN) 500 MG tablet Take 1 tablet (500 mg total) by mouth 2 (two) times daily with a meal. Patient not taking: Reported on 06/30/2017 01/30/17 01/30/18  Merlyn Lot, MD    Physical Exam Vitals: Blood pressure (!) 145/89, pulse 70, temperature 98.2 F (36.8 C), temperature source Oral, resp. rate 16, weight 224 lb (101.6 kg), SpO2 100 %. General: NAD HEENT: normocephalic, anicteric Pulmonary: CTAB, no increased work of breathing Cardiovascular: RRR, distal pulses 2+ Abdomen: Obese, soft, non-distended, non-tender Genitourinary: deferred Extremities: no edema, erythema, or tenderness Neurologic: Grossly intact Psychiatric: mood appropriate, affect full  Imaging No results found.  Assessment: 36 y.o. G4P4 presenting for scheduled diagnostic laparoscopy  Plan: 1) I have had a careful discussion with this patient about all the options available and the risk/benefits of each. I have fully informed this patient that a laparoscopy may subject her to a variety of discomforts and risks: She understands that most patients have surgery with little difficulty, but problems can happen ranging from minor to fatal. These include nausea, vomiting, pain, bleeding, infection, poor healing, hernia, or formation of adhesions. Unexpected reactions may occur from any drug or anesthetic given. Unintended injury may occur to other pelvic or abdominal structures such as Fallopian tubes, ovaries, bladder, ureter (tube  from kidney to bladder), or bowel. Nerves going from the pelvis to the legs may be injured. Any such injury may require immediate or later additional surgery to correct the problem. Excessive blood loss requiring transfusion is very unlikely but possible. Dangerous blood clots may form in the legs or lungs. Physical and sexual activity  will be restricted in varying degrees for an indeterminate period of time but most often 2-4 weeks. She understands that the plan is to do this laparoscopically, however, there is a chance that this will need to be performed via a larger incision. Finally, she understands that it is impossible to list every possible undesirable effect and that the condition for which surgery is done is not always cured or significantly improved, and in rare cases may be even worsen. Ample time was given to answer all questions.  2) Routine postoperative instructions were reviewed with the patient and her family in detail today including the expected length of recovery and likely postoperative course.  The patient concurred with the proposed plan, giving informed written consent for the surgery today.  Patient instructed on the importance of being NPO after midnight prior to her procedure.  If warranted preoperative prophylactic antibiotics and SCDs ordered on call to the OR to meet SCIP guidelines and adhere to recommendation laid forth in [place] Number 104 May 2009  "Antibiotic Prophylaxis for Gynecologic Procedures".

## 2017-07-07 NOTE — Transfer of Care (Signed)
Immediate Anesthesia Transfer of Care Note  Patient: Sherry Watkins  Procedure(s) Performed: Procedure(s): LAPAROSCOPY DIAGNOSTIC (N/A)  Patient Location: PACU  Anesthesia Type:General  Level of Consciousness: awake  Airway & Oxygen Therapy: Patient Spontanous Breathing and Patient connected to face mask oxygen  Post-op Assessment: Report given to RN and Post -op Vital signs reviewed and stable  Post vital signs: Reviewed and stable  Last Vitals:  Vitals:   07/07/17 1004  BP: (!) 145/89  Pulse: 70  Resp: 16  Temp: 36.8 C  SpO2: 100%    Last Pain:  Vitals:   07/07/17 1004  TempSrc: Oral  PainSc: 0-No pain         Complications: No apparent anesthesia complications

## 2017-07-07 NOTE — Discharge Instructions (Signed)

## 2017-07-07 NOTE — Anesthesia Procedure Notes (Signed)
Procedure Name: Intubation Date/Time: 07/07/2017 12:30 PM Performed by: Allean Found Pre-anesthesia Checklist: Patient identified, Emergency Drugs available, Suction available, Patient being monitored and Timeout performed Patient Re-evaluated:Patient Re-evaluated prior to induction Oxygen Delivery Method: Circle system utilized Preoxygenation: Pre-oxygenation with 100% oxygen Induction Type: IV induction Ventilation: Mask ventilation without difficulty Laryngoscope Size: Mac and 3 Tube type: Oral Tube size: 7.0 mm Number of attempts: 1 Airway Equipment and Method: Stylet Placement Confirmation: ETT inserted through vocal cords under direct vision,  positive ETCO2 and breath sounds checked- equal and bilateral Secured at: 21 cm Tube secured with: Tape Dental Injury: Teeth and Oropharynx as per pre-operative assessment

## 2017-07-07 NOTE — Progress Notes (Signed)
pepcid given for raw throat  Hard to swallow

## 2017-07-07 NOTE — Anesthesia Preprocedure Evaluation (Signed)
Anesthesia Evaluation  Patient identified by MRN, date of birth, ID band Patient awake    Reviewed: Allergy & Precautions, NPO status , Patient's Chart, lab work & pertinent test results  Airway Mallampati: II       Dental  (+) Teeth Intact   Pulmonary neg pulmonary ROS, former smoker,     + decreased breath sounds      Cardiovascular Exercise Tolerance: Good  Rhythm:Regular Rate:Normal     Neuro/Psych  Headaches, Depression    GI/Hepatic Neg liver ROS, GERD  Medicated,  Endo/Other  negative endocrine ROS  Renal/GU negative Renal ROS  negative genitourinary   Musculoskeletal negative musculoskeletal ROS (+)   Abdominal (+) + obese,   Peds negative pediatric ROS (+)  Hematology negative hematology ROS (+)   Anesthesia Other Findings   Reproductive/Obstetrics negative OB ROS                             Anesthesia Physical Anesthesia Plan  ASA: II  Anesthesia Plan: General   Post-op Pain Management:    Induction: Intravenous  PONV Risk Score and Plan: 1 and Ondansetron and Dexamethasone  Airway Management Planned: Oral ETT  Additional Equipment:   Intra-op Plan:   Post-operative Plan: Extubation in OR  Informed Consent: I have reviewed the patients History and Physical, chart, labs and discussed the procedure including the risks, benefits and alternatives for the proposed anesthesia with the patient or authorized representative who has indicated his/her understanding and acceptance.     Plan Discussed with: CRNA  Anesthesia Plan Comments:         Anesthesia Quick Evaluation

## 2017-07-08 ENCOUNTER — Encounter: Payer: Self-pay | Admitting: Obstetrics and Gynecology

## 2017-07-08 NOTE — Telephone Encounter (Signed)
I explained to patient she would need to come in for her postop appointment before he can release her back to work. She states that appointment is not until 8/24 and she can not stay out of work that long.   Pt aware AMS is out of the office until Monday he will review message and I will call her with his response.

## 2017-07-08 NOTE — Telephone Encounter (Signed)
Patient wants to know if she can return to work on Aug 20th.  She had surgery yesterday.  Patient feels better today then yesterday.

## 2017-07-11 ENCOUNTER — Encounter: Payer: Self-pay | Admitting: Obstetrics and Gynecology

## 2017-07-11 NOTE — Telephone Encounter (Signed)
Per Dr. Georgianne Fick, the note has already been faxed.

## 2017-07-11 NOTE — Telephone Encounter (Signed)
Patient l/m on my v/m that she is scheduled Friday to see Dr. Georgianne Fick but is trying to go back to work Wednesday. She said she is feeling a little better but not 100%. Please call patient.

## 2017-07-11 NOTE — Telephone Encounter (Signed)
Patient left another message on my v/m during lunch. Patient said she had spoken to Dr. Georgianne Fick, and requested a fax be sent to her job stating she may return to work on Wednesday if able, and if not, that she would return to work on Monday, 07/18/17. Fax# [phone].

## 2017-07-14 NOTE — Progress Notes (Signed)
      Postoperative Follow-up Patient presents post op from diagnostic laparoscopy 1weeks ago for pelvic pain.  Subjective: Patient reports some improvement in her preop symptoms. Eating a regular diet without difficulty. Pain is controlled without any medications.  Activity: normal activities of daily living.  Objective: There were no vitals filed for this visit.  Gen: NAD HEENT: normocephalic, anicteric Pulmonary: no increased work of breathing Abdomen: soft, non-tender, non-distended, incisions D/C/I Ext: no edema  Assessment: 36 y.o. s/p diagnostic laparoscopy stable  Plan: Patient has done well after surgery with no apparent complications.  I have discussed the post-operative course to date, and the expected progress moving forward.  The patient understands what complications to be concerned about.  I will see the patient in routine follow up, or sooner if needed.    Activity plan: No restriction.  Referral [facility] pelvic pain clinic, most adhesions were on left she reports more pain on right lower quadrant radiating into groin  Malachy Mood 07/14/2017, 9:45 PM

## 2017-07-15 ENCOUNTER — Ambulatory Visit (INDEPENDENT_AMBULATORY_CARE_PROVIDER_SITE_OTHER): Payer: Self-pay | Admitting: Obstetrics and Gynecology

## 2017-07-15 ENCOUNTER — Encounter: Payer: Self-pay | Admitting: Obstetrics and Gynecology

## 2017-07-15 VITALS — BP 110/82 | HR 80 | Wt 229.0 lb

## 2017-07-15 DIAGNOSIS — Z4889 Encounter for other specified surgical aftercare: Secondary | ICD-10-CM

## 2017-07-19 ENCOUNTER — Telehealth: Payer: Self-pay

## 2017-07-19 NOTE — Telephone Encounter (Signed)
-----   Message from Gae Dry, MD sent at 07/19/2017  7:13 AM EDT ----- Regarding: RE: Pain Medication Dx Lap 12 days ago would not require continued narcotics usually. Would need to be seen as to why so much pain. Or transition to Ibuprofen 600-800 mg every 4-6 hours as needed.  ----- Message ----- From: [place], Brighid Koch, [place] Sent: 07/18/2017   1:30 PM To: Gae Dry, MD Subject: FW: Pain Medication                            Post op appointment was with AMS on 8/24. Please advise ----- Message ----- From: [place] Lodge Sent: 07/18/2017   1:19 PM To: Ashtynn Berke [place], CMA Subject: FW: Pain Medication                            Patient left another message on my v/m during lunch about refilling her pain medication. She has been taking two per day and needs a refill. [phone]  ----- Message ----- From: [place] Lodge Sent: 07/15/2017   1:44 PM To: Malachy Mood, MD Subject: Pain Medication                                Patient l/m on my v/m requesting you send another prescription of pain medication, that she has been taking two per day but is afraid she might run out. She said she was too scared to ask during her visit today.

## 2017-07-19 NOTE — Telephone Encounter (Signed)
I called patient and left voicemail to call me back

## 2017-07-20 NOTE — Anesthesia Postprocedure Evaluation (Signed)
Anesthesia Post Note  Patient: Sherry Watkins  Procedure(s) Performed: Procedure(s) (LRB): LAPAROSCOPY DIAGNOSTIC (N/A)  Patient location during evaluation: PACU Anesthesia Type: General Level of consciousness: awake Pain management: pain level controlled Vital Signs Assessment: post-procedure vital signs reviewed and stable Respiratory status: spontaneous breathing Cardiovascular status: stable Anesthetic complications: no     Last Vitals:  Vitals:   07/07/17 1513 07/07/17 1600  BP: (!) 145/87 (!) 158/83  Pulse: 68 69  Resp: 18 16  Temp: 37.1 C   SpO2: 99% 99%    Last Pain:  Vitals:   07/08/17 0945  TempSrc:   PainSc: 2                  VAN STAVEREN,[place] Police

## 2017-07-20 NOTE — Telephone Encounter (Signed)
I spoke to patient today and she states when she had her surgery AMS knew he did not find out why she was in pain. Her hip is sore and now she has to go to work in pain. I explained [facility] message and she states Ibuprofen does nothing for her pain. She states she will just wait until AMS returns to office.

## 2017-07-27 ENCOUNTER — Telehealth: Payer: Self-pay | Admitting: Obstetrics and Gynecology

## 2017-07-27 NOTE — Telephone Encounter (Signed)
Patient l/m on my v/m that she needs another refill because she is still hurting.

## 2017-07-27 NOTE — Telephone Encounter (Signed)
Pt will need an appointment for post op pain w/AMS before refill can be given. Please schedule

## 2017-07-28 NOTE — Telephone Encounter (Signed)
Left voicemail for patient to call back to be schedule for post op appointment with AMS

## 2017-08-08 ENCOUNTER — Encounter: Payer: Self-pay | Admitting: Obstetrics and Gynecology

## 2017-08-08 ENCOUNTER — Ambulatory Visit (INDEPENDENT_AMBULATORY_CARE_PROVIDER_SITE_OTHER): Payer: Self-pay | Admitting: Obstetrics and Gynecology

## 2017-08-08 VITALS — BP 130/92 | HR 86 | Wt 231.0 lb

## 2017-08-08 DIAGNOSIS — G8929 Other chronic pain: Secondary | ICD-10-CM

## 2017-08-08 DIAGNOSIS — Z4889 Encounter for other specified surgical aftercare: Secondary | ICD-10-CM

## 2017-08-08 DIAGNOSIS — R102 Pelvic and perineal pain: Secondary | ICD-10-CM

## 2017-08-08 MED ORDER — TRAMADOL HCL 50 MG PO TABS: 50.0000 mg | ORAL_TABLET | Freq: Four times a day (QID) | ORAL | 0 refills | Status: DC | PRN

## 2017-08-08 NOTE — Progress Notes (Signed)
      Postoperative Follow-up Patient presents post op from diagnostic laparoscopy 6weeks ago for chronic pelvic pain.  Subjective: Patient reports no improvement in her preop symptoms. Eating a regular diet without difficulty. pain not well controlled currently only on NSAIDs  Activity: normal activities of daily living.  Objective: Vitals:   08/08/17 1053  BP: (!) 130/92  Pulse: 86   Gen: NAD HEENT: normocephalic, anicteric Pulmonary: no increased work of breathing Abdomen: obese, soft, non-distended, non-tender, trocar sites well healed Ext: no edmea  Assessment: 36 y.o. s/p diagnostic laparoscopy stable  Plan: Patient has done well after surgery with no apparent complications.  I have discussed the post-operative course to date, and the expected progress moving forward.  The patient understands what complications to be concerned about.  I will see the patient in routine follow up, or sooner if needed.    Activity plan: No restriction  Patient has called several times in postoperative period to request narcotic rx.  Discussed I'm unable to identify a clear GYN etiology, and I would not be the provider to manage chronic long term pain.  I've provided the patient with a rx for tramadol which she states hasn't worked for her in the past.  Referral made to pain clinic, has follow up with [facility] pelvic pain clinic.  Malachy Mood 08/09/2017, 10:43 PM No narcotic rx filled other than postoperative rx provided in last 5 months on prescription drug monitoring program referal to pain clinic Referral to [facility] pelvic pain clinic

## 2017-08-09 LAB — CBC WITH DIFFERENTIAL
Basophils Absolute: 0 10*3/uL (ref 0.0–0.2)
Basos: 0 %
EOS (ABSOLUTE): 0.3 10*3/uL (ref 0.0–0.4)
Eos: 4 %
Hematocrit: 41.2 % (ref 34.0–46.6)
Hemoglobin: 14.3 g/dL (ref 11.1–15.9)
Immature Grans (Abs): 0 10*3/uL (ref 0.0–0.1)
Immature Granulocytes: 0 %
Lymphocytes Absolute: 2.8 10*3/uL (ref 0.7–3.1)
Lymphs: 36 %
MCH: 30.2 pg (ref 26.6–33.0)
MCHC: 34.7 g/dL (ref 31.5–35.7)
MCV: 87 fL (ref 79–97)
MONOS ABS: 0.6 10*3/uL (ref 0.1–0.9)
Monocytes: 8 %
NEUTROS ABS: 4.1 10*3/uL (ref 1.4–7.0)
NEUTROS PCT: 52 %
RBC: 4.74 x10E6/uL (ref 3.77–5.28)
RDW: 13.5 % (ref 12.3–15.4)
WBC: 7.9 10*3/uL (ref 3.4–10.8)

## 2017-08-09 LAB — COMPREHENSIVE METABOLIC PANEL
A/G RATIO: 1.3 (ref 1.2–2.2)
ALT: 24 IU/L (ref 0–32)
AST: 25 IU/L (ref 0–40)
Albumin: 4.1 g/dL (ref 3.5–5.5)
Alkaline Phosphatase: 61 IU/L (ref 39–117)
BUN/Creatinine Ratio: 6 — ABNORMAL LOW (ref 9–23)
BUN: 4 mg/dL — ABNORMAL LOW (ref 6–20)
CHLORIDE: 100 mmol/L (ref 96–106)
CO2: 24 mmol/L (ref 20–29)
Calcium: 9.3 mg/dL (ref 8.7–10.2)
Creatinine, Ser: 0.67 mg/dL (ref 0.57–1.00)
GFR calc non Af Amer: 112 mL/min/{1.73_m2} (ref >59)
GFR, EST AFRICAN AMERICAN: 129 mL/min/{1.73_m2} (ref >59)
Globulin, Total: 3.2 g/dL (ref 1.5–4.5)
Glucose: 100 mg/dL — ABNORMAL HIGH (ref 65–99)
POTASSIUM: 4.1 mmol/L (ref 3.5–5.2)
Sodium: 139 mmol/L (ref 134–144)
TOTAL PROTEIN: 7.3 g/dL (ref 6.0–8.5)

## 2017-08-09 LAB — C-REACTIVE PROTEIN: CRP: 2.7 mg/L (ref 0.0–4.9)

## 2017-09-22 ENCOUNTER — Emergency Department
Admission: EM | Admit: 2017-09-22 | Discharge: 2017-09-22 | Disposition: A | Discharge status: Home / Self Care | Payer: Self-pay | Attending: Emergency Medicine | Admitting: Emergency Medicine

## 2017-09-22 ENCOUNTER — Encounter: Payer: Self-pay | Admitting: Emergency Medicine

## 2017-09-22 DIAGNOSIS — R1031 Right lower quadrant pain: Secondary | ICD-10-CM | POA: Insufficient documentation

## 2017-09-22 DIAGNOSIS — G894 Chronic pain syndrome: Secondary | ICD-10-CM

## 2017-09-22 DIAGNOSIS — F329 Major depressive disorder, single episode, unspecified: Secondary | ICD-10-CM | POA: Insufficient documentation

## 2017-09-22 DIAGNOSIS — Z87891 Personal history of nicotine dependence: Secondary | ICD-10-CM | POA: Insufficient documentation

## 2017-09-22 DIAGNOSIS — G8929 Other chronic pain: Secondary | ICD-10-CM | POA: Insufficient documentation

## 2017-09-22 LAB — COMPREHENSIVE METABOLIC PANEL
ALBUMIN: 4.2 g/dL (ref 3.5–5.0)
ALK PHOS: 50 U/L (ref 38–126)
ALT: 33 U/L (ref 14–54)
ANION GAP: 8 (ref 5–15)
AST: 33 U/L (ref 15–41)
BUN: 10 mg/dL (ref 6–20)
CALCIUM: 9.1 mg/dL (ref 8.9–10.3)
CHLORIDE: 104 mmol/L (ref 101–111)
CO2: 24 mmol/L (ref 22–32)
CREATININE: 0.68 mg/dL (ref 0.44–1.00)
GFR calc non Af Amer: >60 mL/min (ref >60)
GLUCOSE: 97 mg/dL (ref 65–99)
Potassium: 3.8 mmol/L (ref 3.5–5.1)
SODIUM: 136 mmol/L (ref 135–145)
Total Bilirubin: 0.5 mg/dL (ref 0.3–1.2)
Total Protein: 8.5 g/dL — ABNORMAL HIGH (ref 6.5–8.1)

## 2017-09-22 LAB — CBC
HCT: 43 % (ref 35.0–47.0)
HEMOGLOBIN: 14.5 g/dL (ref 12.0–16.0)
MCH: 30.3 pg (ref 26.0–34.0)
MCHC: 33.6 g/dL (ref 32.0–36.0)
MCV: 90.1 fL (ref 80.0–100.0)
Platelets: 326 10*3/uL (ref 150–440)
RBC: 4.78 MIL/uL (ref 3.80–5.20)
RDW: 13.3 % (ref 11.5–14.5)
WBC: 11.4 10*3/uL — ABNORMAL HIGH (ref 3.6–11.0)

## 2017-09-22 LAB — URINALYSIS, COMPLETE (UACMP) WITH MICROSCOPIC
BILIRUBIN URINE: NEGATIVE
Bacteria, UA: NONE SEEN
GLUCOSE, UA: NEGATIVE mg/dL
HGB URINE DIPSTICK: NEGATIVE
KETONES UR: NEGATIVE mg/dL
Leukocytes, UA: NEGATIVE
Nitrite: NEGATIVE
PH: 5 (ref 5.0–8.0)
Protein, ur: 30 mg/dL — AB
Specific Gravity, Urine: 1.024 (ref 1.005–1.030)

## 2017-09-22 LAB — LIPASE, BLOOD: LIPASE: 24 U/L (ref 11–51)

## 2017-09-22 LAB — POCT PREGNANCY, URINE: Preg Test, Ur: NEGATIVE

## 2017-09-22 MED ORDER — OXYCODONE-ACETAMINOPHEN 5-325 MG PO TABS
2.0000 | ORAL_TABLET | Freq: Once | ORAL | Status: AC
  Administered 2017-09-22: 2 via ORAL
  Filled 2017-09-22: qty 2

## 2017-09-22 NOTE — ED Provider Notes (Signed)
[facility] Emergency Department Provider Note       Time seen: ----------------------------------------- 5:19 PM on 09/22/2017 -----------------------------------------     I have reviewed the triage vital signs and the nursing notes.   HISTORY   Chief Complaint Abdominal Pain    HPI Sherry Watkins is a 36 y.o. female with a history of chronic pelvic pain who presents to the ED for chronic right lower quadrant abdominal pain that began while she was at work. Patient denies fevers, chills, chest pain, shortness of breath, vomiting or diarrhea. She denies any vaginal bleeding or discharge. She had exploratory surgery in August without any specific cause identified for her pain.  Past Medical History:  Diagnosis Date  . Complication of anesthesia   . Depression   . GERD (gastroesophageal reflux disease)    NO MEDS  . Headache    FREQUENTLY  . Ovarian cyst     There are no active problems to display for this patient.   Past Surgical History:  Procedure Laterality Date  . ABLATION    . CESAREAN SECTION    . HYSTEROSCOPY WITH NOVASURE N/A 05/27/2015   Procedure: HYSTEROSCOPY WITH NOVASURE;  Surgeon: Aletha Halim, MD;  Location: [facility] ORS;  Service: Gynecology;  Laterality: N/A;  . LAPAROSCOPY N/A 07/07/2017   Procedure: LAPAROSCOPY DIAGNOSTIC;  Surgeon: Malachy Mood, MD;  Location: [facility] ORS;  Service: Gynecology;  Laterality: N/A;  . TUBAL LIGATION      Allergies Patient has no known allergies.  Social History Social History  Substance Use Topics  . Smoking status: Former Smoker    Years: 15.00    Types: Cigarettes    Quit date: 06/30/2004  . Smokeless tobacco: Never Used  . Alcohol use Yes     Comment: OCC    Review of Systems Constitutional: Negative for fever. Cardiovascular: Negative for chest pain. Respiratory: Negative for shortness of breath. Gastrointestinal: Positive for abdominal pain Genitourinary: Negative for  dysuria. Musculoskeletal: Negative for back pain. Skin: Negative for rash. Neurological: Negative for headaches, focal weakness or numbness.  All systems negative/normal/unremarkable except as stated in the HPI  ____________________________________________   PHYSICAL EXAM:  VITAL SIGNS: ED Triage Vitals [09/22/17 1628]  Enc Vitals Group     BP (!) 135/94     Pulse Rate 76     Resp 18     Temp 98.5 F (36.9 C)     Temp Source Oral     SpO2 100 %     Weight 230 lb (104.3 kg)     Height 5\' 2"  (1.575 m)     Head Circumference      Peak Flow      Pain Score 10     Pain Loc      Pain Edu?      Excl. in [place]?    Constitutional: Alert and oriented. Well appearing and in no distress. Eyes: Conjunctivae are normal. Normal extraocular movements. ENT   Head: Normocephalic and atraumatic.   Nose: No congestion/rhinnorhea.   Mouth/Throat: Mucous membranes are moist.   Neck: No stridor. Cardiovascular: Normal rate, regular rhythm. No murmurs, rubs, or gallops. Respiratory: Normal respiratory effort without tachypnea nor retractions. Breath sounds are clear and equal bilaterally. No wheezes/rales/rhonchi. Gastrointestinal: Mild right lower quadrant tenderness, rebound or guarding. Normal bowel sounds. Musculoskeletal: Nontender with normal range of motion in extremities. No lower extremity tenderness nor edema. Neurologic:  Normal speech and language. No gross focal neurologic deficits are appreciated.  Skin:  Skin  is warm, dry and intact. No rash noted. Psychiatric: Mood and affect are normal. Speech and behavior are normal.  ____________________________________________  ED COURSE:  Pertinent labs & imaging results that were available during my care of the patient were reviewed by me and considered in my medical decision making (see chart for details). Patient presents for abdominal pain, we will assess with labs as indicated    Procedures ____________________________________________   LABS (pertinent positives/negatives)  Labs Reviewed  COMPREHENSIVE METABOLIC PANEL - Abnormal; Notable for the following:       Result Value   Total Protein 8.5 (*)    All other components within normal limits  CBC - Abnormal; Notable for the following:    WBC 11.4 (*)    All other components within normal limits  URINALYSIS, COMPLETE (UACMP) WITH MICROSCOPIC - Abnormal; Notable for the following:    Color, Urine YELLOW (*)    APPearance HAZY (*)    Protein, ur 30 (*)    Squamous Epithelial / LPF 0-5 (*)    All other components within normal limits  LIPASE, BLOOD  POC URINE PREG, ED  POCT PREGNANCY, URINE  ____________________________________________  DIFFERENTIAL DIAGNOSIS   Chronic pain, scar tissue, endometriosis, renal colic, UTI   FINAL ASSESSMENT AND PLAN  Chronic abdominal pain   Plan: Patient had presented for acute on chronic abdominal pain. Patients labs were reassuring. She was given a one-time dose of pain medicine here but otherwise is encouraged to continue taking her tramadol. She has been referred to pain management for outpatient follow-up.   Earleen Newport, MD   Note: This note was generated in part or whole with voice recognition software. Voice recognition is usually quite accurate but there are transcription errors that can and very often do occur. I apologize for any typographical errors that were not detected and corrected.     Earleen Newport, MD 09/22/17 [phone]

## 2017-09-22 NOTE — ED Triage Notes (Signed)
Pt arrived via EMS from work for reports of RLQ abdominal pain that began today at work. Pt denies NVD. EMS reports VSS.

## 2017-09-22 NOTE — ED Notes (Signed)
Pt unable to give urine sample at this time; pt has specimen cup. 

## 2017-09-23 ENCOUNTER — Telehealth: Payer: Self-pay | Admitting: Obstetrics and Gynecology

## 2017-09-23 NOTE — Telephone Encounter (Signed)
Pt is calling back to speak to [place]. Please advise

## 2017-09-23 NOTE — Telephone Encounter (Signed)
Patient l/m on my v/m that she was at the hospital last night, was told she has chronic pain, and was not prescribed any medication. Patient requests a referral to a pain doctor, a letter faxed to her job, and for someone to call back. The patient was very hard to understand on the v/m message so please contact the patient to confirm.

## 2017-09-23 NOTE — Telephone Encounter (Signed)
She should have a referral in following her postop visit from me as well, also we can't write her out of work for a visit that was in the ER she should request the documentation of her visit from them but they can't write her out of work

## 2017-09-23 NOTE — Telephone Encounter (Signed)
Please advise 

## 2017-09-23 NOTE — Telephone Encounter (Signed)
I lvm for patient to call me back. It looks like she has already been referred to pain clinic by the ER doctor.   Please advise

## 2017-09-30 NOTE — Telephone Encounter (Signed)
Sherry Watkins, can you please call patient about the referral?

## 2017-09-30 NOTE — Telephone Encounter (Signed)
Please advise pt

## 2017-10-17 ENCOUNTER — Telehealth: Payer: Self-pay | Admitting: Obstetrics and Gynecology

## 2017-10-17 NOTE — Telephone Encounter (Signed)
Patient returned the call. She said she was seen in [facility] ER and her pcp Rutherford Guys sent a referral to the pain clinic. She rec'd a letter from them on 10/05/17 and said she needs to call to make an appt but was waiting because she has new insurance through her job that will start in January. I suggested she go ahead and call for the appointment and just request January, that if she waits until January to call she will likely have to wait for a new patient appt. Patient said she would call. Patient is aware to call back if she needs anything else, and has my phone# and ext.

## 2017-10-17 NOTE — Telephone Encounter (Signed)
L/m with patient that I was following up on an older message, hat it looked like she is being taken care of within the [facility] system but wanted to be sure, and to please give me a call back if anything else was needed.

## 2017-11-12 ENCOUNTER — Encounter: Payer: Self-pay | Admitting: Emergency Medicine

## 2017-11-12 ENCOUNTER — Emergency Department: Payer: Self-pay

## 2017-11-12 ENCOUNTER — Other Ambulatory Visit: Payer: Self-pay

## 2017-11-12 ENCOUNTER — Emergency Department
Admission: EM | Admit: 2017-11-12 | Discharge: 2017-11-12 | Disposition: A | Discharge status: Home / Self Care | Payer: Self-pay | Attending: Emergency Medicine | Admitting: Emergency Medicine

## 2017-11-12 DIAGNOSIS — Z87891 Personal history of nicotine dependence: Secondary | ICD-10-CM | POA: Insufficient documentation

## 2017-11-12 DIAGNOSIS — R1031 Right lower quadrant pain: Secondary | ICD-10-CM | POA: Insufficient documentation

## 2017-11-12 LAB — COMPREHENSIVE METABOLIC PANEL
ALT: 33 U/L (ref 14–54)
ANION GAP: 6 (ref 5–15)
AST: 29 U/L (ref 15–41)
Albumin: 3.8 g/dL (ref 3.5–5.0)
Alkaline Phosphatase: 46 U/L (ref 38–126)
BUN: 9 mg/dL (ref 6–20)
CHLORIDE: 103 mmol/L (ref 101–111)
CO2: 26 mmol/L (ref 22–32)
CREATININE: 0.56 mg/dL (ref 0.44–1.00)
Calcium: 8.9 mg/dL (ref 8.9–10.3)
Glucose, Bld: 106 mg/dL — ABNORMAL HIGH (ref 65–99)
POTASSIUM: 3.4 mmol/L — AB (ref 3.5–5.1)
SODIUM: 135 mmol/L (ref 135–145)
Total Bilirubin: 0.4 mg/dL (ref 0.3–1.2)
Total Protein: 7.8 g/dL (ref 6.5–8.1)

## 2017-11-12 LAB — CBC
HCT: 40.3 % (ref 35.0–47.0)
HEMOGLOBIN: 13.6 g/dL (ref 12.0–16.0)
MCH: 30.5 pg (ref 26.0–34.0)
MCHC: 33.7 g/dL (ref 32.0–36.0)
MCV: 90.8 fL (ref 80.0–100.0)
PLATELETS: 317 10*3/uL (ref 150–440)
RBC: 4.44 MIL/uL (ref 3.80–5.20)
RDW: 13.1 % (ref 11.5–14.5)
WBC: 7.6 10*3/uL (ref 3.6–11.0)

## 2017-11-12 LAB — LIPASE, BLOOD: LIPASE: 30 U/L (ref 11–51)

## 2017-11-12 MED ORDER — IOPAMIDOL (ISOVUE-300) INJECTION 61%
125.0000 mL | Freq: Once | INTRAVENOUS | Status: AC | PRN
  Administered 2017-11-12: 125 mL via INTRAVENOUS

## 2017-11-12 MED ORDER — IOPAMIDOL (ISOVUE-300) INJECTION 61%
30.0000 mL | Freq: Once | INTRAVENOUS | Status: AC | PRN
  Administered 2017-11-12: 30 mL via ORAL

## 2017-11-12 NOTE — ED Notes (Signed)
Pt redfused to sign discharge. Stating we have not done anything to help her. Pt states she is going to [facility] for treatment. Pt is NAD, A/O x4 and ambulatory. Pt then left the room and walked out.

## 2017-11-12 NOTE — ED Triage Notes (Addendum)
Lower R abd pain began x days ago. States has had episodes of similar pain x several years without diagnosis.

## 2017-11-12 NOTE — Discharge Instructions (Signed)
please follow-up with your regular doctor and continue with pain clinic referral as U discussed. CT was negative blood work is normal please return for increasing pain fever or vomiting.

## 2017-11-12 NOTE — ED Provider Notes (Signed)
[facility] Emergency Department Provider Note   ____________________________________________   First MD Initiated Contact with Patient 11/12/17 [phone]     (approximate)  I have reviewed the triage vital signs and the nursing notes.   HISTORY  Chief Complaint Abdominal Pain    HPI Sherry Watkins is a 36 y.o. female Who complains of recurrent right lower quadrant pain. She's had one CT several years ago 3 ultrasounds at least in the last several years. The pain has recurred again achy right lower quadrant pain and nausea vomiting fever pain is moderate pain is made worse with palpationher doctor is planning on referring her to the pain clinic.   Past Medical History:  Diagnosis Date  . Complication of anesthesia   . Depression   . GERD (gastroesophageal reflux disease)    NO MEDS  . Headache    FREQUENTLY  . Ovarian cyst     There are no active problems to display for this patient.   Past Surgical History:  Procedure Laterality Date  . ABLATION    . CESAREAN SECTION    . HYSTEROSCOPY WITH NOVASURE N/A 05/27/2015   Procedure: HYSTEROSCOPY WITH NOVASURE;  Surgeon: Aletha Halim, MD;  Location: [facility] ORS;  Service: Gynecology;  Laterality: N/A;  . LAPAROSCOPY N/A 07/07/2017   Procedure: LAPAROSCOPY DIAGNOSTIC;  Surgeon: Malachy Mood, MD;  Location: [facility] ORS;  Service: Gynecology;  Laterality: N/A;  . TUBAL LIGATION      Prior to Admission medications   Medication Sig Start Date End Date Taking? Authorizing Provider  ibuprofen (ADVIL,MOTRIN) 600 MG tablet Take 1 tablet (600 mg total) by mouth every 6 (six) hours as needed. 07/07/17   Malachy Mood, MD  traMADol (ULTRAM) 50 MG tablet Take 1 tablet (50 mg total) by mouth every 6 (six) hours as needed. 08/08/17   Malachy Mood, MD    Allergies Patient has no known allergies.  History reviewed. No pertinent family history.  Social History Social History   Tobacco Use  . Smoking  status: Former Smoker    Years: 15.00    Types: Cigarettes    Last attempt to quit: 06/30/2004    Years since quitting: 13.3  . Smokeless tobacco: Never Used  Substance Use Topics  . Alcohol use: Yes    Comment: OCC  . Drug use: No    review of systems Constitutional: No fever/chills Eyes: No visual changes. ENT: No sore throat. Cardiovascular: Denies chest pain. Respiratory: Denies shortness of breath. Gastrointestinal:  abdominal pain.  No nausea, no vomiting.  No diarrhea.  No constipation. Genitourinary: Negative for dysuria. Musculoskeletal: Negative for back pain. Skin: Negative for rash. Neurological: Negative for headaches, focal weakness   ____________________________________________   PHYSICAL EXAM:  VITAL SIGNS: ED Triage Vitals  Enc Vitals Group     BP 11/12/17 0854 (!) 145/86     Pulse Rate 11/12/17 0854 69     Resp 11/12/17 0854 20     Temp 11/12/17 0854 98 F (36.7 C)     Temp Source 11/12/17 0854 Oral     SpO2 11/12/17 0854 100 %     Weight 11/12/17 0855 235 lb (106.6 kg)     Height 11/12/17 0855 5\' 2"  (1.575 m)     Head Circumference --      Peak Flow --      Pain Score 11/12/17 0853 10     Pain Loc --      Pain Edu? --  Excl. in [place]? --     Constitutional: Alert and oriented. Well appearing and in no acute distress. Eyes: Conjunctivae are normal. . Head: Atraumatic. Nose: No congestion/rhinnorhea. Mouth/Throat: Mucous membranes are moist.  Oropharynx non-erythematous. Neck: No stridor.  Cardiovascular: Normal rate, regular rhythm. Grossly normal heart sounds.  Good peripheral circulation. Respiratory: Normal respiratory effort.  No retractions. Lungs CTAB. Gastrointestinal: Soft and nontenderexcept for extreme right lower quadrant liters tender to palpation only. No distention. No abdominal bruits. No CVA tenderness. Musculoskeletal: No lower extremity tenderness nor edema.  No joint effusions. Neurologic:  Normal speech and language. No  gross focal neurologic deficits are appreciated. No gait instability. Skin:  Skin is warm, dry and intact. No rash noted. Psychiatric: Mood and affect are normal. Speech and behavior are normal.  ____________________________________________   LABS (all labs ordered are listed, but only abnormal results are displayed)  Labs Reviewed  COMPREHENSIVE METABOLIC PANEL - Abnormal; Notable for the following components:      Result Value   Potassium 3.4 (*)    Glucose, Bld 106 (*)    All other components within normal limits  LIPASE, BLOOD  CBC  URINALYSIS, COMPLETE (UACMP) WITH MICROSCOPIC   ____________________________________________  EKG   ____________________________________________  RADIOLOGY  CT shows no cause for the patient's pain ____________________________________________   PROCEDURES  Procedure(s) performed:   Procedures  Critical Care performed:   ____________________________________________   INITIAL IMPRESSION / ASSESSMENT AND PLAN / ED COURSE        ____________________________________________   FINAL CLINICAL IMPRESSION(S) / ED DIAGNOSES  patient is following up with her regular doctor and is supposed to get a pain clinic referral. Since probably a good idea. The patient's continued right lower quadrant pain. I will let her go to return if she becomes worse.  Final diagnoses:  Right lower quadrant abdominal pain     ED Discharge Orders    None       Note:  This document was prepared using Dragon voice recognition software and may include unintentional dictation errors.    Nena Polio, MD 11/12/17 [phone]

## 2017-12-26 ENCOUNTER — Ambulatory Visit: Payer: BLUE CROSS/BLUE SHIELD | Attending: Nurse Practitioner | Admitting: Nurse Practitioner

## 2017-12-26 ENCOUNTER — Encounter: Payer: Self-pay | Admitting: Nurse Practitioner

## 2017-12-26 VITALS — BP 146/94 | HR 66 | Temp 97.3°F | Resp 16 | Ht 62.0 in | Wt 237.0 lb

## 2017-12-26 DIAGNOSIS — F330 Major depressive disorder, recurrent, mild: Secondary | ICD-10-CM | POA: Diagnosis not present

## 2017-12-26 DIAGNOSIS — F1190 Opioid use, unspecified, uncomplicated: Secondary | ICD-10-CM | POA: Diagnosis not present

## 2017-12-26 DIAGNOSIS — M5441 Lumbago with sciatica, right side: Secondary | ICD-10-CM | POA: Diagnosis not present

## 2017-12-26 DIAGNOSIS — G8929 Other chronic pain: Secondary | ICD-10-CM | POA: Diagnosis not present

## 2017-12-26 DIAGNOSIS — M899 Disorder of bone, unspecified: Secondary | ICD-10-CM | POA: Diagnosis not present

## 2017-12-26 DIAGNOSIS — Z79899 Other long term (current) drug therapy: Secondary | ICD-10-CM | POA: Diagnosis not present

## 2017-12-26 DIAGNOSIS — R1031 Right lower quadrant pain: Secondary | ICD-10-CM | POA: Insufficient documentation

## 2017-12-26 DIAGNOSIS — F329 Major depressive disorder, single episode, unspecified: Secondary | ICD-10-CM | POA: Insufficient documentation

## 2017-12-26 DIAGNOSIS — M79604 Pain in right leg: Secondary | ICD-10-CM | POA: Diagnosis not present

## 2017-12-26 DIAGNOSIS — Z789 Other specified health status: Secondary | ICD-10-CM | POA: Diagnosis not present

## 2017-12-26 DIAGNOSIS — Z87891 Personal history of nicotine dependence: Secondary | ICD-10-CM | POA: Diagnosis not present

## 2017-12-26 DIAGNOSIS — G894 Chronic pain syndrome: Secondary | ICD-10-CM | POA: Diagnosis not present

## 2017-12-26 DIAGNOSIS — K219 Gastro-esophageal reflux disease without esophagitis: Secondary | ICD-10-CM | POA: Insufficient documentation

## 2017-12-26 NOTE — Progress Notes (Signed)
Safety precautions to be maintained throughout the outpatient stay will include: orient to surroundings, keep bed in low position, maintain call bell within reach at all times, provide assistance with transfer out of bed and ambulation.  

## 2017-12-26 NOTE — Patient Instructions (Signed)

## 2017-12-26 NOTE — Progress Notes (Signed)
Patient's Name: Sherry Watkins  MRN: [id number]  Referring Provider: Letta Median, MD  DOB: 18-Mar-1981  PCP: [facility] [place]  DOS: 12/26/2017  Note by: Dionisio David NP  Service setting: Ambulatory outpatient  Specialty: Interventional Pain Management  Location: [facility] (AMB) Pain Management Facility    Patient type: New Patient    Primary Reason(s) for Visit: Initial Patient Evaluation CC: Abdominal Pain (LRQ pain )  HPI  Sherry Watkins is a 37 y.o. year old, female patient, who comes today for an initial evaluation. She has Abdominal pain, chronic, right lower quadrant (Primary Area of Pain); Chronic pain of right lower extremity (Secondary Area of Pain); Chronic bilateral low back pain with right-sided sciatica [facility] Area of Pain); Chronic pain syndrome; Opiate use; Problems influencing health status; Pharmacologic therapy; and Disorder of skeletal system on their problem list.. Her primarily concern today is the Abdominal Pain (LRQ pain )  Pain Assessment: Location: Right, Lower Abdomen Radiating: into the right leg Onset: More than a month ago Duration: Chronic pain Quality: Constant, Sharp, Discomfort(feels worse than labor contractions) Severity: 10-Worst pain ever/10 (self-reported pain score)  Note: Reported level is compatible with observation.                         When using our objective Pain Scale, levels between 6 and 10/10 are said to belong in an emergency room, as it progressively worsens from a 6/10, described as severely limiting, requiring emergency care not usually available at an outpatient pain management facility. At a 6/10 level, communication becomes difficult and requires great effort. Assistance to reach the emergency department may be required. Facial flushing and profuse sweating along with potentially dangerous increases in heart rate and blood pressure will be evident. Effect on ADL: unable to work at times d/t pain.  Timing:  Constant Modifying factors: nothing helps  Onset and Duration: Date of onset: 4 years and Present longer than 3 months Cause of pain: Unknown Severity: Getting worse, NAS-11 at its worse: 10/10, NAS-11 at its best: 0/10, NAS-11 now: 10/10 and NAS-11 on the average: 10/10 Timing: Not influenced by the time of the day Aggravating Factors: Bowel movements Alleviating Factors: Medications and Relaxation therapy Associated Problems: Depression, Sweating, Pain that wakes patient up and Pain that does not allow patient to sleep Quality of Pain: Getting longer, Sharp and Stabbing Previous Examinations or Tests: The patient denies Dx laparoscopy Previous Treatments: The patient denies none  The patient comes into the clinics today for the first time for a chronic pain management evaluation. According to the patient her primary area of pain is in her right lower quadrant. She describes a constant pain that occasionally goes down her leg. She is status post exploratory laparoscopic surgery 2018 which was negative. She denies any other interventional therapies or physical therapy.  Her second area of pain is in her right lower extremity. She admits that she has tingling that goes down into the ankles. She denies any numbness or weakness.  She admits that she does have occasional lower back pain. She denies any previous surgeries, interventional therapy or physical therapy.  Today I took the time to provide the patient with information regarding this pain practice. The patient was informed that the practice is divided into two sections: an interventional pain management section, as well as a completely separate and distinct medication management section. I explained that there are procedure days for interventional therapies, and evaluation days for  follow-ups and medication management. Because of the amount of documentation required during both, they are kept separated. This means that there is the  possibility that she may be scheduled for a procedure on one day, and medication management the next. I have also informed her that because of staffing and facility limitations, this practice will no longer take patients for medication management only. To illustrate the reasons for this, I gave the patient the example of surgeons, and how inappropriate it would be to refer a patient to his/her care, just to write for the post-surgical antibiotics on a surgery done by a different surgeon.   Because interventional pain management is part of the board-certified specialty for the doctors, the patient was informed that joining this practice means that they are open to any and all interventional therapies. I made it clear that this does not mean that they will be forced to have any procedures done. What this means is that I believe interventional therapies to be essential part of the diagnosis and proper management of chronic pain conditions. Therefore, patients not interested in these interventional alternatives will be better served under the care of a different practitioner.  The patient was also made aware of my Comprehensive Pain Management Safety Guidelines where by joining this practice, they limit all of their nerve blocks and joint injections to those done by our practice, for as long as we are retained to manage their care. Historic Controlled Substance Pharmacotherapy Review  PMP and historical list of controlled substances: Tramadol 50 mg, oxycodone/acetaminophen 5/325 mg, hydrocodone/acetaminophen 5/325 mg, Highest opioid analgesic regimen found: Oxycodone/acetaminophen 5/325 mg 2 tablets 5 times daily (fill date 07/07/2017) oxycodone 50 per day Most recent opioid analgesic: Tramadol 50 mg 1 tablet 4 times daily (fill date 08/08/2017) tramadol 200 mg per day Current opioid analgesics: Tramadol 50 mg 1 tablet 4 times daily (fill date 08/08/2017) tramadol 200 mg per day Highest recorded MME/day: 75  mg/day MME/day: 20 mg/day Medications: The patient did not bring the medication(s) to the appointment, as requested in our "New Patient Package" Pharmacodynamics: Desired effects: Analgesia: The patient reports >50% benefit. Reported improvement in function: The patient reports medication allows her to accomplish basic ADLs. Clinically meaningful improvement in function (CMIF): Sustained CMIF goals met Perceived effectiveness: Described as relatively effective, allowing for increase in activities of daily living (ADL) Undesirable effects: Side-effects or Adverse reactions: None reported Historical Monitoring: The patient  reports that she does not use drugs. List of all UDS Test(s): No results found for: MDMA, COCAINSCRNUR, PCPSCRNUR, PCPQUANT, CANNABQUANT, THCU, [place] List of all Serum Drug Screening Test(s):  No results found for: AMPHSCRSER, BARBSCRSER, BENZOSCRSER, COCAINSCRSER, PCPSCRSER, PCPQUANT, THCSCRSER, CANNABQUANT, OPIATESCRSER, OXYSCRSER, PROPOXSCRSER Historical Background Evaluation: [place] PDMP: Six (6) year initial data search conducted.             [place] Department of public safety, offender search: Editor, commissioning Information) Non-contributory Risk Assessment Profile: Aberrant behavior: None observed or detected today Risk factors for fatal opioid overdose: None identified today Fatal overdose hazard ratio (HR): Calculation deferred Non-fatal overdose hazard ratio (HR): Calculation deferred Risk of opioid abuse or dependence: 0.7-3.0% with doses ? 36 MME/day and 6.1-26% with doses ? 120 MME/day. Substance use disorder (SUD) risk level: Pending results of Medical Psychology Evaluation for SUD Opioid risk tool (ORT) (Total Score):    ORT Scoring interpretation table:  Score <3 = Low Risk for SUD  Score between 4-7 = Moderate Risk for SUD  Score >8 = High Risk for Opioid  Abuse   PHQ-2 Depression Scale:  Total score: 2  PHQ-2 Scoring interpretation table: (Score and probability of  major depressive disorder)  Score 0 = No depression  Score 1 = 15.4% Probability  Score 2 = 21.1% Probability  Score 3 = 38.4% Probability  Score 4 = 45.5% Probability  Score 5 = 56.4% Probability  Score 6 = 78.6% Probability   PHQ-9 Depression Scale:  Total score: 2  PHQ-9 Scoring interpretation table:  Score 0-4 = No depression  Score 5-9 = Mild depression  Score 10-14 = Moderate depression  Score 15-19 = Moderately severe depression  Score 20-27 = Severe depression (2.4 times higher risk of SUD and 2.89 times higher risk of overuse)   Pharmacologic Plan: Pending ordered tests and/or consults  Meds  The patient has a current medication list which includes the following prescription(s): ibuprofen and tramadol.  Current Outpatient Medications on File Prior to Visit  Medication Sig  . ibuprofen (ADVIL,MOTRIN) 600 MG tablet Take 1 tablet (600 mg total) by mouth every 6 (six) hours as needed.  . traMADol (ULTRAM) 50 MG tablet Take 1 tablet (50 mg total) by mouth every 6 (six) hours as needed.   No current facility-administered medications on file prior to visit.    Imaging Review    Note: No new results found.        ROS  Cardiovascular History: No reported cardiovascular signs or symptoms such as High blood pressure, coronary artery disease, abnormal heart rate or rhythm, heart attack, blood thinner therapy or heart weakness and/or failure Pulmonary or Respiratory History: No reported pulmonary signs or symptoms such as wheezing and difficulty taking a deep full breath (Asthma), difficulty blowing air out (Emphysema), coughing up mucus (Bronchitis), persistent dry cough, or temporary stoppage of breathing during sleep Neurological History: No reported neurological signs or symptoms such as seizures, abnormal skin sensations, urinary and/or fecal incontinence, being born with an abnormal open spine and/or a tethered spinal cord Review of Past Neurological Studies: No results found  for this or any previous visit. Psychological-Psychiatric History: No reported psychological or psychiatric signs or symptoms such as difficulty sleeping, anxiety, depression, delusions or hallucinations (schizophrenial), mood swings (bipolar disorders) or suicidal ideations or attempts Gastrointestinal History: Reflux or heatburn Genitourinary History: No reported renal or genitourinary signs or symptoms such as difficulty voiding or producing urine, peeing blood, non-functioning kidney, kidney stones, difficulty emptying the bladder, difficulty controlling the flow of urine, or chronic kidney disease Hematological History: No reported hematological signs or symptoms such as prolonged bleeding, low or poor functioning platelets, bruising or bleeding easily, hereditary bleeding problems, low energy levels due to low hemoglobin or being anemic Endocrine History: No reported endocrine signs or symptoms such as high or low blood sugar, rapid heart rate due to high thyroid levels, obesity or weight gain due to slow thyroid or thyroid disease Rheumatologic History: No reported rheumatological signs and symptoms such as fatigue, joint pain, tenderness, swelling, redness, heat, stiffness, decreased range of motion, with or without associated rash Musculoskeletal History: Negative for myasthenia gravis, muscular dystrophy, multiple sclerosis or malignant hyperthermia Work History: Working full time  Allergies  Sherry Watkins has No Known Allergies.  Laboratory Chemistry  Inflammation Markers Lab Results  Component Value Date   CRP 2.7 08/08/2017   (CRP: Acute Phase) (ESR: Chronic Phase) Renal Function Markers Lab Results  Component Value Date   BUN 9 11/12/2017   CREATININE 0.56 11/12/2017   GFRAA >60 11/12/2017   GFRNONAA >60 11/12/2017  Hepatic Function Markers Lab Results  Component Value Date   AST 29 11/12/2017   ALT 33 11/12/2017   ALBUMIN 3.8 11/12/2017   ALKPHOS 46 11/12/2017    Electrolytes Lab Results  Component Value Date   NA 135 11/12/2017   K 3.4 (L) 11/12/2017   CL 103 11/12/2017   CALCIUM 8.9 11/12/2017   Neuropathy Markers No results found for: [id number] Bone Pathology Markers Lab Results  Component Value Date   ALKPHOS 46 11/12/2017   CALCIUM 8.9 11/12/2017   Coagulation Parameters Lab Results  Component Value Date   PLT 317 11/12/2017   Cardiovascular Markers Lab Results  Component Value Date   HGB 13.6 11/12/2017   HCT 40.3 11/12/2017   Note: Lab results reviewed.  [place]  Drug: Sherry Watkins  reports that she does not use drugs. Alcohol:  reports that she drinks alcohol. Tobacco:  reports that she quit smoking about 13 years ago. Her smoking use included cigarettes. She quit after 15.00 years of use. she has never used smokeless tobacco. Medical:  has a past medical history of Complication of anesthesia, Depression, GERD (gastroesophageal reflux disease), Headache, and Ovarian cyst. Family: family history includes Cancer in her father; Diabetes in her mother; Heart disease in her brother and father; Hypertension in her mother.  Past Surgical History:  Procedure Laterality Date  . ABLATION    . CESAREAN SECTION    . HYSTEROSCOPY WITH NOVASURE N/A 05/27/2015   Procedure: HYSTEROSCOPY WITH NOVASURE;  Surgeon: Aletha Halim, MD;  Location: [facility] ORS;  Service: Gynecology;  Laterality: N/A;  . LAPAROSCOPY N/A 07/07/2017   Procedure: LAPAROSCOPY DIAGNOSTIC;  Surgeon: Malachy Mood, MD;  Location: [facility] ORS;  Service: Gynecology;  Laterality: N/A;  . TUBAL LIGATION     Active Ambulatory Problems    Diagnosis Date Noted  . Abdominal pain, chronic, right lower quadrant (Primary Area of Pain) 12/26/2017  . Chronic pain of right lower extremity (Secondary Area of Pain) 12/26/2017  . Chronic bilateral low back pain with right-sided sciatica [facility] Area of Pain) 12/26/2017  . Chronic pain syndrome 12/26/2017  . Opiate use 12/26/2017   . Problems influencing health status 12/26/2017  . Pharmacologic therapy 12/26/2017  . Disorder of skeletal system 12/26/2017   Resolved Ambulatory Problems    Diagnosis Date Noted  . No Resolved Ambulatory Problems   Past Medical History:  Diagnosis Date  . Complication of anesthesia   . Depression   . GERD (gastroesophageal reflux disease)   . Headache   . Ovarian cyst    Constitutional Exam  General appearance: Well nourished, well developed, and well hydrated. In no apparent acute distress Vitals:   12/26/17 1310  BP: (!) 146/94  Pulse: 66  Resp: 16  Temp: (!) 97.3 F (36.3 C)  TempSrc: Oral  SpO2: 98%  Weight: 237 lb (107.5 kg)  Height: _0  (1.575 m)   BMI Assessment: Estimated body mass index is 43.35 kg/m as calculated from the following:   Height as of this encounter: _1  (1.575 m).   Weight as of this encounter: 237 lb (107.5 kg).  BMI interpretation table: BMI level Category Range association with higher incidence of chronic pain  <18 kg/m2 Underweight   18.5-24.9 kg/m2 Ideal body weight   25-29.9 kg/m2 Overweight Increased incidence by 20%  30-34.9 kg/m2 Obese (Class I) Increased incidence by 68%  35-39.9 kg/m2 Severe obesity (Class II) Increased incidence by 136%  >40 kg/m2 Extreme obesity (Class III) Increased incidence by 254%  BMI Readings from Last 4 Encounters:  12/26/17 43.35 kg/m  11/12/17 42.98 kg/m  09/22/17 42.07 kg/m  08/08/17 42.25 kg/m   Wt Readings from Last 4 Encounters:  12/26/17 237 lb (107.5 kg)  11/12/17 235 lb (106.6 kg)  09/22/17 230 lb (104.3 kg)  08/08/17 231 lb (104.8 kg)  Psych/Mental status: Alert, oriented x 3 (person, place, & time)       Eyes: PERLA Respiratory: No evidence of acute respiratory distress  Cervical Spine Exam  Inspection: No masses, redness, or swelling Alignment: Symmetrical Functional ROM: Unrestricted ROM      Stability: No instability detected Muscle strength & Tone: Functionally  intact Sensory: Unimpaired Palpation: No palpable anomalies              Upper Extremity (UE) Exam    Side: Right upper extremity  Side: Left upper extremity  Inspection: No masses, redness, swelling, or asymmetry. No contractures  Inspection: No masses, redness, swelling, or asymmetry. No contractures  Functional ROM: Unrestricted ROM          Functional ROM: Unrestricted ROM          Muscle strength & Tone: Functionally intact  Muscle strength & Tone: Functionally intact  Sensory: Unimpaired  Sensory: Unimpaired  Palpation: No palpable anomalies              Palpation: No palpable anomalies              Specialized Test(s): Deferred         Specialized Test(s): Deferred          Abdomen: Obese Right lower quadrant tenderness with palpation   Thoracic Spine Exam  Inspection: No masses, redness, or swelling Alignment: Symmetrical Functional ROM: Unrestricted ROM Stability: No instability detected Sensory: Unimpaired Muscle strength & Tone: No palpable anomalies  Lumbar Spine Exam  Inspection: No masses, redness, or swelling Alignment: Symmetrical Functional ROM: Unrestricted ROM      Stability: No instability detected Muscle strength & Tone: Increased muscle tone over affected area on the right Sensory: Unimpaired Palpation: Complains of area being tender to palpation       Provocative Tests: Lumbar Hyperextension and rotation test: Positive on the right for facet joint pain. Patrick's Maneuver: Negative                    Gait & Posture Assessment  Ambulation: Unassisted Gait: Relatively normal for age and body habitus Posture: WNL   Lower Extremity Exam    Side: Right lower extremity  Side: Left lower extremity  Inspection: No masses, redness, swelling, or asymmetry. No contractures  Inspection: No masses, redness, swelling, or asymmetry. No contractures  Functional ROM: Unrestricted ROM          Functional ROM: Unrestricted ROM          Muscle strength & Tone: Able to  Toe-walk & Heel-walk without problems  Muscle strength & Tone: Able to Toe-walk & Heel-walk without problems  Sensory: Unimpaired  Sensory: Unimpaired  Palpation: No palpable anomalies  Palpation: No palpable anomalies   Assessment  Primary Diagnosis & Pertinent Problem List: The primary encounter diagnosis was Abdominal pain, chronic, right lower quadrant (Primary Area of Pain). Diagnoses of Chronic pain of right lower extremity (Secondary Area of Pain), Chronic bilateral low back pain with right-sided sciatica [facility] Area of Pain), Chronic pain syndrome, Opiate use, Problems influencing health status, Pharmacologic therapy, and Disorder of skeletal system were also pertinent to this visit.  Visit Diagnosis: 1. Abdominal  pain, chronic, right lower quadrant (Primary Area of Pain)   2. Chronic pain of right lower extremity (Secondary Area of Pain)   3. Chronic bilateral low back pain with right-sided sciatica [facility] Area of Pain)   4. Chronic pain syndrome   5. Opiate use   6. Problems influencing health status   7. Pharmacologic therapy   8. Disorder of skeletal system    Plan of Care  Initial treatment plan:  Please be advised that as per protocol, today's visit has been an evaluation only. We have not taken over the patient's controlled substance management.  Problem-specific plan: No problem-specific Assessment & Plan notes found for this encounter.  Ordered Lab-work, Procedure(s), Referral(s), & Consult(s): Orders Placed This Encounter  Procedures  . DG Lumbar Spine Complete W/Bend  . Compliance Drug Analysis, Ur  . Comp. Metabolic Panel (12)  . Magnesium  . Vitamin B12  . Sedimentation rate  . 25-Hydroxyvitamin D Lcms D2+D3  . C-reactive protein  . Ambulatory referral to Psychology   Pharmacotherapy: Medications ordered:  No orders of the defined types were placed in this encounter.  Medications administered during this visit: Tamyrah R. Tep had no medications  administered during this visit.   Pharmacotherapy under consideration:  Opioid Analgesics: The patient was informed that there is no guarantee that she would be a candidate for opioid analgesics. The decision will be made following CDC guidelines. This decision will be based on the results of diagnostic studies, as well as Sherry Watkins's risk profile.  Membrane stabilizer: To be determined at a later time Muscle relaxant: To be determined at a later time NSAID: To be determined at a later time Other analgesic(s): To be determined at a later time   Interventional therapies under consideration: Sherry Watkins was informed that there is no guarantee that she would be a candidate for interventional therapies. The decision will be based on the results of diagnostic studies, as well as Sherry Watkins's risk profile.  Possible procedure(s): Diagnostic celiac plexus nerve block Diagnostic right-sided lumbar LESI Diagnostic right-sided lumbar facet nerve block Possible right-sided lumbar facet RFA    Provider-requested follow-up: Return for 2nd Visit, w/ Dr. Dossie Arbour, after [facility] eval.  No future appointments.  Primary Care Physician: [facility] [place] Location: [facility] Outpatient Pain Management Facility Note by:  Date: 12/26/2017; Time: 2:27 PM  Pain Score Disclaimer: We use the NRS-11 scale. This is a self-reported, subjective measurement of pain severity with only modest accuracy. It is used primarily to identify changes within a particular patient. It must be understood that outpatient pain scales are significantly less accurate that those used for research, where they can be applied under ideal controlled circumstances with minimal exposure to variables. In reality, the score is likely to be a combination of pain intensity and pain affect, where pain affect describes the degree of emotional arousal or changes in action readiness caused by the sensory experience of pain. Factors such as  social and work situation, setting, emotional state, anxiety levels, expectation, and prior pain experience may influence pain perception and show large inter-individual differences that may also be affected by time variables.  Patient instructions provided during this appointment: Patient Instructions    ____________________________________________________________________________________________  Appointment Policy Summary  It is our goal and responsibility to provide the medical community with assistance in the evaluation and management of patients with chronic pain. Unfortunately our resources are limited. Because we do not have an unlimited amount of time, or available appointments, we are  required to closely monitor and manage their use. The following rules exist to maximize their use:  Patient's responsibilities: 1. Punctuality:  At what time should I arrive? You should be physically present in our office 30 minutes before your scheduled appointment. Your scheduled appointment is with your assigned healthcare provider. However, it takes 5-10 minutes to be "checked-in", and another 15 minutes for the nurses to do the admission. If you arrive to our office at the time you were given for your appointment, you will end up being at least 20-25 minutes late to your appointment with the provider. 2. Tardiness:  What happens if I arrive only a few minutes after my scheduled appointment time? You will need to reschedule your appointment. The cutoff is your appointment time. This is why it is so important that you arrive at least 30 minutes before that appointment. If you have an appointment scheduled for 10:00 AM and you arrive at 10:01, you will be required to reschedule your appointment.  3. Plan ahead:  Always assume that you will encounter traffic on your way in. Plan for it. If you are dependent on a driver, make sure they understand these rules and the need to arrive early. 4. Other  appointments and responsibilities:  Avoid scheduling any other appointments before or after your pain clinic appointments.  5. Be prepared:  Write down everything that you need to discuss with your healthcare provider and give this information to the admitting nurse. Write down the medications that you will need refilled. Bring your pills and bottles (even the empty ones), to all of your appointments, except for those where a procedure is scheduled. 6. No children or pets:  Find someone to take care of them. It is not appropriate to bring them in. 7. Scheduling changes:  We request "advanced notification" of any changes or cancellations. 8. Advanced notification:  Defined as a time period of more than 24 hours prior to the originally scheduled appointment. This allows for the appointment to be offered to other patients. 9. Rescheduling:  When a visit is rescheduled, it will require the cancellation of the original appointment. For this reason they both fall within the category of "Cancellations".  10. Cancellations:  They require advanced notification. Any cancellation less than 24 hours before the  appointment will be recorded as a "No Show". 11. No Show:  Defined as an unkept appointment where the patient failed to notify or declare to the practice their intention or inability to keep the appointment.  Corrective process for repeat offenders:  1. Tardiness: Three (3) episodes of rescheduling due to late arrivals will be recorded as one (1) "No Show". 2. Cancellation or reschedule: Three (3) cancellations or rescheduling will be recorded as one (1) "No Show". 3. "No Shows": Three (3) "No Shows" within a 12 month period will result in discharge from the practice.  ____________________________________________________________________________________________   ____________________________________________________________________________________________  Pain Scale  Introduction: The pain  score used by this practice is the Verbal Numerical Rating Scale (VNRS-11). This is an 11-point scale. It is for adults and children 10 years or older. There are significant differences in how the pain score is reported, used, and applied. Forget everything you learned in the past and learn this scoring system.  General Information: The scale should reflect your current level of pain. Unless you are specifically asked for the level of your worst pain, or your average pain. If you are asked for one of these two, then it should be understood that  it is over the past 24 hours.  Basic Activities of Daily Living (ADL): Personal hygiene, dressing, eating, transferring, and using restroom.  Instructions: Most patients tend to report their level of pain as a combination of two factors, their physical pain and their psychosocial pain. This last one is also known as "suffering" and it is reflection of how physical pain affects you socially and psychologically. From now on, report them separately. From this point on, when asked to report your pain level, report only your physical pain. Use the following table for reference.  Pain Clinic Pain Levels (0-5/10)  Pain Level Score  Description  No Pain 0   Mild pain 1 Nagging, annoying, but does not interfere with basic activities of daily living (ADL). Patients are able to eat, bathe, get dressed, toileting (being able to get on and off the toilet and perform personal hygiene functions), transfer (move in and out of bed or a chair without assistance), and maintain continence (able to control bladder and bowel functions). Blood pressure and heart rate are unaffected. A normal heart rate for a healthy adult ranges from 60 to 100 bpm (beats per minute).   Mild to moderate pain 2 Noticeable and distracting. Impossible to hide from other people. More frequent flare-ups. Still possible to adapt and function close to normal. It can be very annoying and may have occasional  stronger flare-ups. With discipline, patients may get used to it and adapt.   Moderate pain 3 Interferes significantly with activities of daily living (ADL). It becomes difficult to feed, bathe, get dressed, get on and off the toilet or to perform personal hygiene functions. Difficult to get in and out of bed or a chair without assistance. Very distracting. With effort, it can be ignored when deeply involved in activities.   Moderately severe pain 4 Impossible to ignore for more than a few minutes. With effort, patients may still be able to manage work or participate in some social activities. Very difficult to concentrate. Signs of autonomic nervous system discharge are evident: dilated pupils (mydriasis); mild sweating (diaphoresis); sleep interference. Heart rate becomes elevated (>115 bpm). Diastolic blood pressure (lower number) rises above 100 mmHg. Patients find relief in laying down and not moving.   Severe pain 5 Intense and extremely unpleasant. Associated with frowning face and frequent crying. Pain overwhelms the senses.  Ability to do any activity or maintain social relationships becomes significantly limited. Conversation becomes difficult. Pacing back and forth is common, as getting into a comfortable position is nearly impossible. Pain wakes you up from deep sleep. Physical signs will be obvious: pupillary dilation; increased sweating; goosebumps; brisk reflexes; cold, clammy hands and feet; nausea, vomiting or dry heaves; loss of appetite; significant sleep disturbance with inability to fall asleep or to remain asleep. When persistent, significant weight loss is observed due to the complete loss of appetite and sleep deprivation.  Blood pressure and heart rate becomes significantly elevated. Caution: If elevated blood pressure triggers a pounding headache associated with blurred vision, then the patient should immediately seek attention at an urgent or emergency care unit, as these may be  signs of an impending stroke.    Emergency Department Pain Levels (6-10/10)  Emergency Room Pain 6 Severely limiting. Requires emergency care and should not be seen or managed at an outpatient pain management facility. Communication becomes difficult and requires great effort. Assistance to reach the emergency department may be required. Facial flushing and profuse sweating along with potentially dangerous increases in heart rate  and blood pressure will be evident.   Distressing pain 7 Self-care is very difficult. Assistance is required to transport, or use restroom. Assistance to reach the emergency department will be required. Tasks requiring coordination, such as bathing and getting dressed become very difficult.   Disabling pain 8 Self-care is no longer possible. At this level, pain is disabling. The individual is unable to do even the most "basic" activities such as walking, eating, bathing, dressing, transferring to a bed, or toileting. Fine motor skills are lost. It is difficult to think clearly.   Incapacitating pain 9 Pain becomes incapacitating. Thought processing is no longer possible. Difficult to remember your own name. Control of movement and coordination are lost.   The worst pain imaginable 10 At this level, most patients pass out from pain. When this level is reached, collapse of the autonomic nervous system occurs, leading to a sudden drop in blood pressure and heart rate. This in turn results in a temporary and dramatic drop in blood flow to the brain, leading to a loss of consciousness. Fainting is one of the body's self defense mechanisms. Passing out puts the brain in a calmed state and causes it to shut down for a while, in order to begin the healing process.    Summary: 1. Refer to this scale when providing [place] with your pain level. 2. Be accurate and careful when reporting your pain level. This will help with your care. 3. Over-reporting your pain level will lead to loss of  credibility. 4. Even a level of 1/10 means that there is pain and will be treated at our facility. 5. High, inaccurate reporting will be documented as "Symptom Exaggeration", leading to loss of credibility and suspicions of possible secondary gains such as obtaining more narcotics, or wanting to appear disabled, for fraudulent reasons. 6. Only pain levels of 5 or below will be seen at our facility. 7. Pain levels of 6 and above will be sent to the Emergency Department and the appointment cancelled. ____________________________________________________________________________________________

## 2017-12-30 LAB — COMP. METABOLIC PANEL (12)
ALBUMIN: 4.2 g/dL (ref 3.5–5.5)
ALK PHOS: 53 IU/L (ref 39–117)
AST: 23 IU/L (ref 0–40)
Albumin/Globulin Ratio: 1.3 (ref 1.2–2.2)
BUN/Creatinine Ratio: 7 — ABNORMAL LOW (ref 9–23)
BUN: 6 mg/dL (ref 6–20)
Bilirubin Total: 0.3 mg/dL (ref 0.0–1.2)
CREATININE: 0.82 mg/dL (ref 0.57–1.00)
Calcium: 9.3 mg/dL (ref 8.7–10.2)
Chloride: 103 mmol/L (ref 96–106)
GFR calc Af Amer: 105 mL/min/{1.73_m2} (ref >59)
GFR calc non Af Amer: 91 mL/min/{1.73_m2} (ref >59)
GLOBULIN, TOTAL: 3.3 g/dL (ref 1.5–4.5)
Glucose: 86 mg/dL (ref 65–99)
POTASSIUM: 4.4 mmol/L (ref 3.5–5.2)
SODIUM: 140 mmol/L (ref 134–144)
Total Protein: 7.5 g/dL (ref 6.0–8.5)

## 2017-12-30 LAB — C-REACTIVE PROTEIN: CRP: 2.9 mg/L (ref 0.0–4.9)

## 2017-12-30 LAB — MAGNESIUM: MAGNESIUM: 1.8 mg/dL (ref 1.6–2.3)

## 2017-12-30 LAB — 25-HYDROXY VITAMIN D LCMS D2+D3
25-Hydroxy, Vitamin D-2: <1 ng/mL
25-Hydroxy, Vitamin D-3: 16 ng/mL
25-Hydroxy, Vitamin D: 17 ng/mL — ABNORMAL LOW

## 2017-12-30 LAB — VITAMIN B12: VITAMIN B 12: 288 pg/mL (ref 232–1245)

## 2017-12-30 LAB — COMPLIANCE DRUG ANALYSIS, UR

## 2017-12-30 LAB — SEDIMENTATION RATE: SED RATE: 67 mm/h — AB (ref 0–32)

## 2018-01-12 ENCOUNTER — Telehealth: Payer: Self-pay | Admitting: *Deleted

## 2018-01-18 ENCOUNTER — Ambulatory Visit: Payer: Medicaid Other | Admitting: Pain Medicine

## 2018-01-25 ENCOUNTER — Ambulatory Visit: Payer: Medicaid Other | Admitting: Pain Medicine

## 2018-01-27 ENCOUNTER — Emergency Department
Admission: EM | Admit: 2018-01-27 | Discharge: 2018-01-27 | Disposition: A | Discharge status: Home / Self Care | Payer: BLUE CROSS/BLUE SHIELD | Attending: Emergency Medicine | Admitting: Emergency Medicine

## 2018-01-27 ENCOUNTER — Other Ambulatory Visit: Payer: Self-pay

## 2018-01-27 DIAGNOSIS — Z87891 Personal history of nicotine dependence: Secondary | ICD-10-CM | POA: Diagnosis not present

## 2018-01-27 DIAGNOSIS — R1031 Right lower quadrant pain: Secondary | ICD-10-CM | POA: Insufficient documentation

## 2018-01-27 DIAGNOSIS — Z79899 Other long term (current) drug therapy: Secondary | ICD-10-CM | POA: Diagnosis not present

## 2018-01-27 DIAGNOSIS — R109 Unspecified abdominal pain: Secondary | ICD-10-CM

## 2018-01-27 DIAGNOSIS — G8929 Other chronic pain: Secondary | ICD-10-CM | POA: Insufficient documentation

## 2018-01-27 LAB — URINALYSIS, COMPLETE (UACMP) WITH MICROSCOPIC
Bilirubin Urine: NEGATIVE
GLUCOSE, UA: NEGATIVE mg/dL
KETONES UR: NEGATIVE mg/dL
LEUKOCYTES UA: NEGATIVE
NITRITE: NEGATIVE
PROTEIN: NEGATIVE mg/dL
Specific Gravity, Urine: 1.024 (ref 1.005–1.030)
pH: 5 (ref 5.0–8.0)

## 2018-01-27 LAB — COMPREHENSIVE METABOLIC PANEL
ALK PHOS: 48 U/L (ref 38–126)
ALT: 42 U/L (ref 14–54)
AST: 46 U/L — ABNORMAL HIGH (ref 15–41)
Albumin: 4.1 g/dL (ref 3.5–5.0)
Anion gap: 8 (ref 5–15)
BILIRUBIN TOTAL: 0.7 mg/dL (ref 0.3–1.2)
BUN: 6 mg/dL (ref 6–20)
CALCIUM: 9 mg/dL (ref 8.9–10.3)
CO2: 22 mmol/L (ref 22–32)
Chloride: 104 mmol/L (ref 101–111)
Creatinine, Ser: 0.88 mg/dL (ref 0.44–1.00)
GFR calc non Af Amer: >60 mL/min (ref >60)
Glucose, Bld: 111 mg/dL — ABNORMAL HIGH (ref 65–99)
Potassium: 4.5 mmol/L (ref 3.5–5.1)
Sodium: 134 mmol/L — ABNORMAL LOW (ref 135–145)
TOTAL PROTEIN: 8.6 g/dL — AB (ref 6.5–8.1)

## 2018-01-27 LAB — CBC
HCT: 46.2 % (ref 35.0–47.0)
HEMOGLOBIN: 15.1 g/dL (ref 12.0–16.0)
MCH: 29.2 pg (ref 26.0–34.0)
MCHC: 32.7 g/dL (ref 32.0–36.0)
MCV: 89.4 fL (ref 80.0–100.0)
Platelets: 343 10*3/uL (ref 150–440)
RBC: 5.17 MIL/uL (ref 3.80–5.20)
RDW: 13.4 % (ref 11.5–14.5)
WBC: 10.5 10*3/uL (ref 3.6–11.0)

## 2018-01-27 LAB — LIPASE, BLOOD: Lipase: 26 U/L (ref 11–51)

## 2018-01-27 MED ORDER — OXYCODONE-ACETAMINOPHEN 5-325 MG PO TABS
1.0000 | ORAL_TABLET | Freq: Once | ORAL | Status: AC
  Administered 2018-01-27: 1 via ORAL
  Filled 2018-01-27: qty 1

## 2018-01-27 MED ORDER — OXYCODONE-ACETAMINOPHEN 5-325 MG PO TABS: 1.0000 | ORAL_TABLET | ORAL | 0 refills | Status: DC | PRN

## 2018-01-27 NOTE — ED Provider Notes (Signed)
[facility] Emergency Department Provider Note  ____________________________________________   First MD Initiated Contact with Patient 01/27/18 1721     (approximate)  I have reviewed the triage vital signs and the nursing notes.   HISTORY  Chief Complaint Abdominal Pain   HPI Sherry Watkins is a 37 y.o. female who self presents to the emergency department with 5 days of constant severe right lower abdominal pain.  The pain is nonradiating.  It is constant.  Nothing seems to make it better or worse.  No fevers or chills.  No diarrhea.  Normal bowel movements.  No dysuria frequency or hesitancy.  No back pain.  She has a long-standing history of pain identical to this without clear etiology.  She has had multiple CT scans, multiple ultrasounds, and an exploratory laparoscopy without clear etiology of her symptoms.  The patient is frustrated that she does not have a diagnosis and is frustrated that her pain persists.  She takes tramadol normally for pain which does minimal.  She says that Percocet is the only thing that seems to help.  She does have an appointment to see a pain specialist for the first time in 10 days from now.   Past Medical History:  Diagnosis Date  . Complication of anesthesia   . Depression   . GERD (gastroesophageal reflux disease)    NO MEDS  . Headache    FREQUENTLY  . Ovarian cyst     Patient Active Problem List   Diagnosis Date Noted  . Abdominal pain, chronic, right lower quadrant (Primary Area of Pain) 12/26/2017  . Chronic pain of right lower extremity (Secondary Area of Pain) 12/26/2017  . Chronic bilateral low back pain with right-sided sciatica [facility] Area of Pain) 12/26/2017  . Chronic pain syndrome 12/26/2017  . Opiate use 12/26/2017  . Problems influencing health status 12/26/2017  . Pharmacologic therapy 12/26/2017  . Disorder of skeletal system 12/26/2017    Past Surgical History:  Procedure Laterality Date    . ABLATION    . CESAREAN SECTION    . HYSTEROSCOPY WITH NOVASURE N/A 05/27/2015   Procedure: HYSTEROSCOPY WITH NOVASURE;  Surgeon: Aletha Halim, MD;  Location: [facility] ORS;  Service: Gynecology;  Laterality: N/A;  . LAPAROSCOPY N/A 07/07/2017   Procedure: LAPAROSCOPY DIAGNOSTIC;  Surgeon: Malachy Mood, MD;  Location: [facility] ORS;  Service: Gynecology;  Laterality: N/A;  . TUBAL LIGATION      Prior to Admission medications   Medication Sig Start Date End Date Taking? Authorizing Provider  ibuprofen (ADVIL,MOTRIN) 600 MG tablet Take 1 tablet (600 mg total) by mouth every 6 (six) hours as needed. 07/07/17   Malachy Mood, MD  oxyCODONE-acetaminophen (PERCOCET/ROXICET) 5-325 MG tablet Take 1 tablet by mouth every 4 (four) hours as needed for severe pain. 01/27/18   Darel Hong, MD  traMADol (ULTRAM) 50 MG tablet Take 1 tablet (50 mg total) by mouth every 6 (six) hours as needed. 08/08/17   Malachy Mood, MD    Allergies Patient has no known allergies.  Family History  Problem Relation Age of Onset  . Diabetes Mother   . Hypertension Mother   . Heart disease Father   . Cancer Father   . Heart disease Brother     Social History Social History   Tobacco Use  . Smoking status: Former Smoker    Years: 15.00    Types: Cigarettes    Last attempt to quit: 06/30/2004    Years since quitting: 13.5  .  Smokeless tobacco: Never Used  Substance Use Topics  . Alcohol use: Yes    Comment: OCC  . Drug use: No    Review of Systems Constitutional: No fever/chills Eyes: No visual changes. ENT: No sore throat. Cardiovascular: Denies chest pain. Respiratory: Denies shortness of breath. Gastrointestinal: Positive for abdominal pain.  No nausea, no vomiting.  No diarrhea.  No constipation. Genitourinary: Negative for dysuria. Musculoskeletal: Negative for back pain. Skin: Negative for rash. Neurological: Negative for headaches, focal weakness or  numbness.   ____________________________________________   PHYSICAL EXAM:  VITAL SIGNS: ED Triage Vitals [01/27/18 1146]  Enc Vitals Group     BP (!) 157/85     Pulse Rate 82     Resp 18     Temp 98.3 F (36.8 C)     Temp Source Oral     SpO2 100 %     Weight 235 lb (106.6 kg)     Height 5\' 2"  (1.575 m)     Head Circumference      Peak Flow      Pain Score 10     Pain Loc      Pain Edu?      Excl. in [place]?     Constitutional: Alert and oriented x4 tearful and uncomfortable appearing Eyes: PERRL EOMI. Head: Atraumatic. Nose: No congestion/rhinnorhea. Mouth/Throat: No trismus Neck: No stridor.   Cardiovascular: Normal rate, regular rhythm. Grossly normal heart sounds.  Good peripheral circulation. Respiratory: Normal respiratory effort.  No retractions. Lungs CTAB and moving good air Gastrointestinal: Morbidly obese soft abdomen focal tenderness under her pannus lower than her abdomen around the area of the right inguinal ligament no rebound no guarding no peritonitis Musculoskeletal: No lower extremity edema   Neurologic:  Normal speech and language. No gross focal neurologic deficits are appreciated. Skin:  Skin is warm, dry and intact. No rash noted. Psychiatric: Depressed affect    ____________________________________________   DIFFERENTIAL includes but not limited to  Chronic pain, appendicitis, diverticulitis, ovarian torsion, ovarian cyst ____________________________________________   LABS (all labs ordered are listed, but only abnormal results are displayed)  Labs Reviewed  COMPREHENSIVE METABOLIC PANEL - Abnormal; Notable for the following components:      Result Value   Sodium 134 (*)    Glucose, Bld 111 (*)    Total Protein 8.6 (*)    AST 46 (*)    All other components within normal limits  URINALYSIS, COMPLETE (UACMP) WITH MICROSCOPIC - Abnormal; Notable for the following components:   Color, Urine YELLOW (*)    APPearance HAZY (*)    Hgb urine  dipstick SMALL (*)    Bacteria, UA RARE (*)    Squamous Epithelial / LPF 0-5 (*)    All other components within normal limits  LIPASE, BLOOD  CBC    Lab work reviewed by me with no acute disease __________________________________________  EKG   ____________________________________________  RADIOLOGY   ____________________________________________   PROCEDURES  Procedure(s) performed: no  Procedures  Critical Care performed: no  Observation: no ____________________________________________   INITIAL IMPRESSION / ASSESSMENT AND PLAN / ED COURSE  Pertinent labs & imaging results that were available during my care of the patient were reviewed by me and considered in my medical decision making (see chart for details).  Patient arrives uncomfortable appearing with benign abdominal exam.  She is hemodynamically stable although clearly in pain.  We will give her a dose of Percocet now.     ----------------------------------------- 5:29 PM on 01/27/2018 -----------------------------------------  The patient is having an acute exacerbation of her chronic pain.  She has had exploratory surgery, multiple CT scans, and multiple ultrasounds without etiology identified.  His pain is identical to her usual pain.  Patient's pain is improved after Percocet.  I had a lengthy discussion with the patient regarding the diagnostic uncertainty and the importance of close follow-up.  She verbalizes understanding and agreement with the plan. ____________________________________________   FINAL CLINICAL IMPRESSION(S) / ED DIAGNOSES  Final diagnoses:  Chronic abdominal pain      NEW MEDICATIONS STARTED DURING THIS VISIT:  Discharge Medication List as of 01/27/2018  5:29 PM    START taking these medications   Details  oxyCODONE-acetaminophen (PERCOCET/ROXICET) 5-325 MG tablet Take 1 tablet by mouth every 4 (four) hours as needed for severe pain., Starting Fri 01/27/2018, Print          Note:  This document was prepared using Dragon voice recognition software and may include unintentional dictation errors.     Darel Hong, MD 01/27/18 [phone]

## 2018-01-27 NOTE — ED Notes (Signed)
Pt has appointment with pain clinic on 3/18.

## 2018-01-27 NOTE — ED Triage Notes (Signed)
To ER via POV c/o RLQ pain since August with increasing pain today. Pt ambulates safely. Pt alert and oriented X4, active, cooperative, pt in NAD. RR even and unlabored, color WNL.

## 2018-01-27 NOTE — Discharge Instructions (Signed)
Please take your pain medication as needed for severe symptoms and keep your appointment with the pain clinic in 10 days as scheduled.  Return to the emergency department sooner for any concerns whatsoever.  It was a pleasure to take care of you today, and thank you for coming to our emergency department.  If you have any questions or concerns before leaving please ask the nurse to grab me and I'm more than happy to go through your aftercare instructions again.  If you were prescribed any opioid pain medication today such as Norco, Vicodin, Percocet, morphine, hydrocodone, or oxycodone please make sure you do not drive when you are taking this medication as it can alter your ability to drive safely.  If you have any concerns once you are home that you are not improving or are in fact getting worse before you can make it to your follow-up appointment, please do not hesitate to call 911 and come back for further evaluation.  Darel Hong, MD  Results for orders placed or performed during the hospital encounter of 01/27/18  Lipase, blood  Result Value Ref Range   Lipase 26 11 - 51 U/L  Comprehensive metabolic panel  Result Value Ref Range   Sodium 134 (L) 135 - 145 mmol/L   Potassium 4.5 3.5 - 5.1 mmol/L   Chloride 104 101 - 111 mmol/L   CO2 22 22 - 32 mmol/L   Glucose, Bld 111 (H) 65 - 99 mg/dL   BUN 6 6 - 20 mg/dL   Creatinine, Ser 0.88 0.44 - 1.00 mg/dL   Calcium 9.0 8.9 - 10.3 mg/dL   Total Protein 8.6 (H) 6.5 - 8.1 g/dL   Albumin 4.1 3.5 - 5.0 g/dL   AST 46 (H) 15 - 41 U/L   ALT 42 14 - 54 U/L   Alkaline Phosphatase 48 38 - 126 U/L   Total Bilirubin 0.7 0.3 - 1.2 mg/dL   GFR calc non Af Amer >60 >60 mL/min   GFR calc Af Amer >60 >60 mL/min   Anion gap 8 5 - 15  CBC  Result Value Ref Range   WBC 10.5 3.6 - 11.0 K/uL   RBC 5.17 3.80 - 5.20 MIL/uL   Hemoglobin 15.1 12.0 - 16.0 g/dL   HCT 46.2 35.0 - 47.0 %   MCV 89.4 80.0 - 100.0 fL   MCH 29.2 26.0 - 34.0 pg   MCHC 32.7 32.0  - 36.0 g/dL   RDW 13.4 11.5 - 14.5 %   Platelets 343 150 - 440 K/uL  Urinalysis, Complete w Microscopic  Result Value Ref Range   Color, Urine YELLOW (A) YELLOW   APPearance HAZY (A) CLEAR   Specific Gravity, Urine 1.024 1.005 - 1.030   pH 5.0 5.0 - 8.0   Glucose, UA NEGATIVE NEGATIVE mg/dL   Hgb urine dipstick SMALL (A) NEGATIVE   Bilirubin Urine NEGATIVE NEGATIVE   Ketones, ur NEGATIVE NEGATIVE mg/dL   Protein, ur NEGATIVE NEGATIVE mg/dL   Nitrite NEGATIVE NEGATIVE   Leukocytes, UA NEGATIVE NEGATIVE   RBC / HPF 6-30 0 - 5 RBC/hpf   WBC, UA 0-5 0 - 5 WBC/hpf   Bacteria, UA RARE (A) NONE SEEN   Squamous Epithelial / LPF 0-5 (A) NONE SEEN   Mucus PRESENT    While here in the ER today you received very powerful medicine that makes it unsafe for you to drive for the rest of the day.  Do not drive until tomorrow.

## 2018-01-27 NOTE — ED Notes (Signed)
Pt discharged to home.  Family member driving.  Discharge instructions reviewed.  Verbalized understanding.  No questions or concerns at this time.  Teach back verified.  Pt in NAD.  No items left in ED.   

## 2018-01-29 DIAGNOSIS — G8929 Other chronic pain: Secondary | ICD-10-CM | POA: Diagnosis not present

## 2018-01-29 DIAGNOSIS — F329 Major depressive disorder, single episode, unspecified: Secondary | ICD-10-CM | POA: Diagnosis not present

## 2018-01-29 DIAGNOSIS — R1031 Right lower quadrant pain: Secondary | ICD-10-CM | POA: Diagnosis not present

## 2018-01-29 DIAGNOSIS — R102 Pelvic and perineal pain: Secondary | ICD-10-CM | POA: Diagnosis not present

## 2018-01-29 DIAGNOSIS — M545 Low back pain: Secondary | ICD-10-CM | POA: Diagnosis not present

## 2018-01-29 DIAGNOSIS — K219 Gastro-esophageal reflux disease without esophagitis: Secondary | ICD-10-CM | POA: Diagnosis not present

## 2018-01-29 DIAGNOSIS — Z791 Long term (current) use of non-steroidal anti-inflammatories (NSAID): Secondary | ICD-10-CM | POA: Diagnosis not present

## 2018-01-31 NOTE — Progress Notes (Signed)
Patient's Name: Sherry Watkins  MRN: [id number]  Referring Provider: [facility]  DOB: October 17, 1981  PCP: [facility], [place]  DOS: 02/01/2018  Note by: Gaspar Cola, MD  Service setting: Ambulatory outpatient  Specialty: Interventional Pain Management  Location: [facility] (AMB) Pain Management Facility    Patient type: Established   Primary Reason(s) for Visit: Encounter for evaluation before starting new chronic pain management plan of care (Level of risk: moderate) CC: Other (groin)  HPI  Sherry Watkins is a 37 y.o. year old, female patient, who comes today for a follow-up evaluation to review the test results and decide on a treatment plan. She has Chronic abdominal pain (RLQ) (Primary Area of Pain) (Right); Chronic lower extremity pain (Secondary Area of Pain) (Right); Chronic low back pain with right-sided sciatica [facility] Area of Pain) (Bilateral) (R>L); Chronic pain syndrome; Opiate use; Problems influencing health status; Pharmacologic therapy; Disorder of skeletal system; Elevated sed rate; Vitamin D deficiency; Chronic sacroiliac joint pain (Right); Other specified dorsopathies, sacral and sacrococcygeal region; and Chronic low back pain (Right) without sciatica on their problem list. Her primarily concern today is the Other (groin)  Pain Assessment: Location: Right Groin Radiating: radiates to right leg and lower back Onset: More than a month ago Duration: Chronic pain Quality: Stabbing, Aching, Throbbing, Sharp Severity: 6 /10 (self-reported pain score)  Note: Reported level is compatible with observation.                         When using our objective Pain Scale, levels between 6 and 10/10 are said to belong in an emergency room, as it progressively worsens from a 6/10, described as severely limiting, requiring emergency care not usually available at an outpatient pain management facility. At a 6/10 level, communication becomes difficult and requires  great effort. Assistance to reach the emergency department may be required. Facial flushing and profuse sweating along with potentially dangerous increases in heart rate and blood pressure will be evident. Effect on ADL: out of all week Timing: Constant Modifying factors: nothing helps  Sherry Watkins comes in today for a follow-up visit after her initial evaluation on 01/12/2018. Today we went over the results of her tests. These were explained in "Layman's terms". During today's appointment we went over my diagnostic impression, as well as the proposed treatment plan.  According to the patient her primary area of pain is in her right lower quadrant/groin. She describes a constant pain that occasionally goes down the lateral aspect of the right leg, half way to the thigh. She is status post exploratory laparoscopic surgery 2018 which was negative. She denies any other interventional therapies or physical therapy.  Her second area of pain is in her right lower extremity. She denies any tingling that goes down into the ankles. She denies any numbness or weakness.  She admits that she does have occasional lower back pain (R) down to buttocks, last experienced last week. She denies any previous surgeries, interventional therapy or physical therapy.  In considering the treatment plan options, [name] was reminded that I no longer take patients for medication management only. I asked her to let me know if she had no intention of taking advantage of the interventional therapies, so that we could make arrangements to provide this space to someone interested. I also made it clear that undergoing interventional therapies for the purpose of getting pain medications is very inappropriate on the part of a patient,  and it will not be tolerated in this practice. This type of behavior would suggest true addiction and therefore it requires referral to an addiction specialist.   Further details on both, my  assessment(s), as well as the proposed treatment plan, please see below.  Controlled Substance Pharmacotherapy Assessment REMS (Risk Evaluation and Mitigation Strategy)  Analgesic: Tramadol 50 mg 1 tablet 4 times daily (fill date 08/08/2017) tramadol 200 mg per day Highest recorded MME/day: 75 mg/day MME/day: 20 mg/day Pill Count: None expected due to no prior prescriptions written by our practice. Chauncey Fischer, RN  02/01/2018  8:48 AM  Sign at close encounter Nursing Pain Medication Assessment:  Safety precautions to be maintained throughout the outpatient stay will include: orient to surroundings, keep bed in low position, maintain call bell within reach at all times, provide assistance with transfer out of bed and ambulation.  Medication Inspection Compliance: [name] did not comply with our request to bring her pills to be counted. She was reminded that bringing the medication bottles, even when empty, is a requirement.  Medication: None brought in. Pill/Patch Count: None available to be counted. Bottle Appearance: No container available. Did not bring bottle(s) to appointment. Filled Date: N/A Last Medication intake:  Yesterday   Pharmacokinetics: Liberation and absorption (onset of action): WNL Distribution (time to peak effect): WNL Metabolism and excretion (duration of action): WNL         Pharmacodynamics: Desired effects: Analgesia: [name] reports >50% benefit. Functional ability: Patient reports that medication allows her to accomplish basic ADLs Clinically meaningful improvement in function (CMIF): Sustained CMIF goals met Perceived effectiveness: Described as relatively effective, allowing for increase in activities of daily living (ADL) Undesirable effects: Side-effects or Adverse reactions: None reported Monitoring: [place] PMP: Online review of the past 41-monthperiod previously conducted. Not applicable at this point since we have not taken over the patient's  medication management yet. List of other Serum/Urine Drug Screening Test(s):  No results found for: AMPHSCRSER, BARBSCRSER, BENZOSCRSER, COCAINSCRSER, COCAINSCRNUR, PCPSCRSER, THCSCRSER, THCU, CANNABQUANT, [place] ODent [place] EFruit HillList of all UDS test(s) done:  Lab Results  Component Value Date   SUMMARY FINAL 12/26/2017   Last UDS on record: Summary  Date Value Ref Range Status  12/26/2017 FINAL  Final    Comment:    ==================================================================== TOXASSURE COMP DRUG ANALYSIS,UR ==================================================================== Test                             Result       Flag       Units Drug Present not Declared for Prescription Verification   Acetaminophen                  PRESENT      UNEXPECTED Drug Absent but Declared for Prescription Verification   Tramadol                       Not Detected UNEXPECTED ng/mg creat   Ibuprofen                      Not Detected UNEXPECTED    Ibuprofen, as indicated in the declared medication list, is not    always detected even when used as directed. ==================================================================== Test                      Result    Flag   Units  Ref Range   Creatinine              192              mg/dL      >=20 ==================================================================== Declared Medications:  The flagging and interpretation on this report are based on the  following declared medications.  Unexpected results may arise from  inaccuracies in the declared medications.  **Note: The testing scope of this panel includes these medications:  Tramadol (Ultram)  **Note: The testing scope of this panel does not include small to  moderate amounts of these reported medications:  Ibuprofen (Advil) ==================================================================== For clinical consultation, please call [phone]. ====================================================================    UDS interpretation: No unexpected findings.          Medication Assessment Form: Patient introduced to form today Treatment compliance: Treatment may start today if patient agrees with proposed plan. Evaluation of compliance is not applicable at this point Risk Assessment Profile: Aberrant behavior: See initial evaluations. None observed or detected today Comorbid factors increasing risk of overdose: See initial evaluation. No additional risks detected today Medical Psychology Evaluation: Please see scanned results in medical record.  ORT Scoring interpretation table:  Score <3 = Low Risk for SUD  Score between 4-7 = Moderate Risk for SUD  Score >8 = High Risk for Opioid Abuse   Risk Mitigation Strategies:  Patient opioid safety counseling: Completed today. Counseling provided to patient as per "Patient Counseling Document". Document signed by patient, attesting to counseling and understanding Patient-Prescriber Agreement (PPA): Obtained today.  Controlled substance notification to other providers: Written and sent today.  Pharmacologic Plan: Today we may be taking over the patient's pharmacological regimen. See below.             Laboratory Chemistry  Inflammation Markers (CRP: Acute Phase) (ESR: Chronic Phase) Lab Results  Component Value Date   CRP 2.9 12/26/2017   ESRSEDRATE 67 (H) 12/26/2017                         Renal Function Markers Lab Results  Component Value Date   BUN 6 01/27/2018   CREATININE 0.88 01/27/2018   GFRAA >60 01/27/2018   GFRNONAA >60 01/27/2018                 Hepatic Function Markers Lab Results  Component Value Date   AST 46 (H) 01/27/2018   ALT 42 01/27/2018   ALBUMIN 4.1 01/27/2018   ALKPHOS 48 01/27/2018   LIPASE 26 01/27/2018                 Electrolytes Lab Results  Component Value Date   NA 134 (L) 01/27/2018   K 4.5 01/27/2018   CL 104  01/27/2018   CALCIUM 9.0 01/27/2018   MG 1.8 12/26/2017                        Neuropathy Markers Lab Results  Component Value Date   VITAMINB12 288 12/26/2017                 Bone Pathology Markers Lab Results  Component Value Date   25OHVITD1 17 (L) 12/26/2017   25OHVITD2 <1.0 12/26/2017   25OHVITD3 16 12/26/2017                         Coagulation Parameters Lab Results  Component Value Date   PLT 343 01/27/2018  Cardiovascular Markers Lab Results  Component Value Date   HGB 15.1 01/27/2018   HCT 46.2 01/27/2018                 Note: Lab results reviewed.  Recent Diagnostic Imaging Review  Study Result   CLINICAL DATA:  Right abdominal pain beginning several days ago.  EXAM: CT ABDOMEN AND PELVIS WITH CONTRAST  TECHNIQUE: Multidetector CT imaging of the abdomen and pelvis was performed using the standard protocol following bolus administration of intravenous contrast.  CONTRAST:  150m ISOVUE-300 IOPAMIDOL (ISOVUE-300) INJECTION 61%  COMPARISON:  CT, 01/01/2014  FINDINGS: Lower chest: Clear lung bases.  Heart normal size.  Hepatobiliary: Small low-density lesion in the central liver measuring 13 mm, stable from the prior study, likely a hemangioma. No other liver abnormality. Normal gallbladder. No bile duct dilation.  Pancreas: Unremarkable. No pancreatic ductal dilatation or surrounding inflammatory changes.  Spleen: Normal in size without focal abnormality.  Adrenals/Urinary Tract: Adrenal glands are unremarkable. Kidneys are normal, without renal calculi, focal lesion, or hydronephrosis. Bladder is unremarkable.  Stomach/Bowel: Moderate size hiatal hernia. Stomach otherwise unremarkable. Normal small bowel. There are several scattered colonic diverticula. No diverticulitis. No other colon abnormality. Normal appendix visualized.  Vascular/Lymphatic: No significant vascular findings are present. No enlarged  abdominal or pelvic lymph nodes.  Reproductive: Uterus and bilateral adnexa are unremarkable.  Other: No abdominal wall hernia or abnormality. No abdominopelvic ascites.  Musculoskeletal: No acute or significant osseous findings.  IMPRESSION: 1. No acute findings in the abdomen or pelvis. No findings to account for the patient's right sided pain. 2. Small stable liver lesion consistent with a hemangioma. No additional evaluation indicated. 3. Several colonic diverticula. No diverticulitis. Normal appendix visualized. No other abnormalities.   Electronically Signed   By: DLajean ManesM.D.   On: 11/12/2017 11:16    Complexity Note: Imaging results reviewed.                         Meds   Current Outpatient Medications:  .  acetaminophen (TYLENOL) 500 MG tablet, Take 1,000 mg by mouth every 6 (six) hours as needed., Disp: , Rfl:  .  oxyCODONE-acetaminophen (PERCOCET/ROXICET) 5-325 MG tablet, Take 1 tablet by mouth every 4 (four) hours as needed for severe pain., Disp: 15 tablet, Rfl: 0 .  Calcium Carbonate-Vit D-Min (GNP CALCIUM 1200) 1200-1000 MG-UNIT CHEW, Chew 1,200 mg by mouth daily with breakfast. Take in combination with vitamin D and magnesium., Disp: 30 tablet, Rfl: 5 .  Cholecalciferol (VITAMIN D3) 5000 units CAPS, Take 1 capsule (5,000 Units total) by mouth daily with breakfast. Take along with calcium and magnesium., Disp: 30 capsule, Rfl: 5 .  ergocalciferol (VITAMIN D2) 50000 units capsule, Take 1 capsule (50,000 Units total) by mouth 2 (two) times a week. X 6 weeks., Disp: 12 capsule, Rfl: 0 .  Magnesium 500 MG CAPS, Take 1 capsule (500 mg total) by mouth 2 (two) times daily at 8 am and 10 pm., Disp: 60 capsule, Rfl: 5 .  traMADol (ULTRAM) 50 MG tablet, Take 1 tablet (50 mg total) by mouth every 6 (six) hours as needed for severe pain., Disp: 120 tablet, Rfl: 0  ROS  Constitutional: Denies any fever or chills Gastrointestinal: No reported hemesis,  hematochezia, vomiting, or acute GI distress Musculoskeletal: Denies any acute onset joint swelling, redness, loss of ROM, or weakness Neurological: No reported episodes of acute onset apraxia, aphasia, dysarthria, agnosia, amnesia, paralysis, loss of coordination, or  loss of consciousness  Allergies  Ms. Mikel has No Known Allergies.  [place]  Drug: Ms. Woodle  reports that she does not use drugs. Alcohol:  reports that she drinks alcohol. Tobacco:  reports that she quit smoking about 13 years ago. Her smoking use included cigarettes. She quit after 15.00 years of use. She has never used smokeless tobacco. Medical:  has a past medical history of Complication of anesthesia, Depression, GERD (gastroesophageal reflux disease), Headache, and Ovarian cyst. Surgical: Ms. Shoff  has a past surgical history that includes Cesarean section; Tubal ligation; Hysteroscopy with novasure (N/A, 05/27/2015); Ablation; and laparoscopy (N/A, 07/07/2017). Family: family history includes Cancer in her father; Diabetes in her mother; Heart disease in her brother and father; Hypertension in her mother.  Constitutional Exam  General appearance: Well nourished, well developed, and well hydrated. In no apparent acute distress Vitals:   02/01/18 0816  BP: 126/72  Pulse: 66  Temp: 98.1 F (36.7 C)  SpO2: 100%  Weight: 235 lb (106.6 kg)  Height: 5' 2" (1.575 m)   BMI Assessment: Estimated body mass index is 42.98 kg/m as calculated from the following:   Height as of this encounter: 5' 2" (1.575 m).   Weight as of this encounter: 235 lb (106.6 kg).  BMI interpretation table: BMI level Category Range association with higher incidence of chronic pain  <18 kg/m2 Underweight   18.5-24.9 kg/m2 Ideal body weight   25-29.9 kg/m2 Overweight Increased incidence by 20%  30-34.9 kg/m2 Obese (Class I) Increased incidence by 68%  35-39.9 kg/m2 Severe obesity (Class II) Increased incidence by 136%  >40 kg/m2 Extreme obesity  (Class III) Increased incidence by 254%   BMI Readings from Last 4 Encounters:  02/09/18 42.98 kg/m  02/01/18 42.98 kg/m  01/27/18 42.98 kg/m  12/26/17 43.35 kg/m   Wt Readings from Last 4 Encounters:  02/09/18 235 lb (106.6 kg)  02/01/18 235 lb (106.6 kg)  01/27/18 235 lb (106.6 kg)  12/26/17 237 lb (107.5 kg)  Psych/Mental status: Alert, oriented x 3 (person, place, & time)       Eyes: PERLA Respiratory: No evidence of acute respiratory distress  Cervical Spine Area Exam  Skin & Axial Inspection: No masses, redness, edema, swelling, or associated skin lesions Alignment: Symmetrical Functional ROM: Unrestricted ROM      Stability: No instability detected Muscle Tone/Strength: Functionally intact. No obvious neuro-muscular anomalies detected. Sensory (Neurological): Unimpaired Palpation: No palpable anomalies              Upper Extremity (UE) Exam    Side: Right upper extremity  Side: Left upper extremity  Skin & Extremity Inspection: Skin color, temperature, and hair growth are WNL. No peripheral edema or cyanosis. No masses, redness, swelling, asymmetry, or associated skin lesions. No contractures.  Skin & Extremity Inspection: Skin color, temperature, and hair growth are WNL. No peripheral edema or cyanosis. No masses, redness, swelling, asymmetry, or associated skin lesions. No contractures.  Functional ROM: Unrestricted ROM          Functional ROM: Unrestricted ROM          Muscle Tone/Strength: Functionally intact. No obvious neuro-muscular anomalies detected.  Muscle Tone/Strength: Functionally intact. No obvious neuro-muscular anomalies detected.  Sensory (Neurological): Unimpaired          Sensory (Neurological): Unimpaired          Palpation: No palpable anomalies              Palpation: No palpable anomalies  Specialized Test(s): Deferred         Specialized Test(s): Deferred          Thoracic Spine Area Exam  Skin & Axial Inspection: No masses, redness,  or swelling Alignment: Symmetrical Functional ROM: Unrestricted ROM Stability: No instability detected Muscle Tone/Strength: Functionally intact. No obvious neuro-muscular anomalies detected. Sensory (Neurological): Unimpaired Muscle strength & Tone: No palpable anomalies  Lumbar Spine Area Exam  Skin & Axial Inspection: No masses, redness, or swelling Alignment: Symmetrical Functional ROM: Unrestricted ROM      Stability: No instability detected Muscle Tone/Strength: Functionally intact. No obvious neuro-muscular anomalies detected. Sensory (Neurological): Unimpaired Palpation: No palpable anomalies       Provocative Tests: Lumbar Hyperextension and rotation test: evaluation deferred today       Lumbar Lateral bending test: evaluation deferred today       Patrick's Maneuver: evaluation deferred today                    Gait & Posture Assessment  Ambulation: Unassisted Gait: Relatively normal for age and body habitus Posture: WNL   Lower Extremity Exam    Side: Right lower extremity  Side: Left lower extremity  Skin & Extremity Inspection: Skin color, temperature, and hair growth are WNL. No peripheral edema or cyanosis. No masses, redness, swelling, asymmetry, or associated skin lesions. No contractures.  Skin & Extremity Inspection: Skin color, temperature, and hair growth are WNL. No peripheral edema or cyanosis. No masses, redness, swelling, asymmetry, or associated skin lesions. No contractures.  Functional ROM: Unrestricted ROM          Functional ROM: Unrestricted ROM          Muscle Tone/Strength: Functionally intact. No obvious neuro-muscular anomalies detected.  Muscle Tone/Strength: Functionally intact. No obvious neuro-muscular anomalies detected.  Sensory (Neurological): Unimpaired  Sensory (Neurological): Unimpaired  Palpation: No palpable anomalies  Palpation: No palpable anomalies   Assessment & Plan  Primary Diagnosis & Pertinent Problem List: The primary  encounter diagnosis was Chronic pain syndrome. Diagnoses of Abdominal pain, chronic, right lower quadrant (Primary Area of Pain), Chronic pain of right lower extremity (Secondary Area of Pain), Chronic bilateral low back pain with right-sided sciatica (Tertiary Area of Pain), Disorder of skeletal system, Problems influencing health status, Pharmacologic therapy, Opiate use, Elevated sed rate, Vitamin D deficiency, Chronic right sacroiliac joint pain, and Other specified dorsopathies, sacral and sacrococcygeal region were also pertinent to this visit.  Visit Diagnosis: 1. Chronic pain syndrome   2. Abdominal pain, chronic, right lower quadrant (Primary Area of Pain)   3. Chronic pain of right lower extremity (Secondary Area of Pain)   4. Chronic bilateral low back pain with right-sided sciatica [facility] Area of Pain)   5. Disorder of skeletal system   6. Problems influencing health status   7. Pharmacologic therapy   8. Opiate use   9. Elevated sed rate   10. Vitamin D deficiency   11. Chronic right sacroiliac joint pain   12. Other specified dorsopathies, sacral and sacrococcygeal region    Problems updated and reviewed during this visit: Problem  Chronic low back pain (Right) without sciatica  Chronic sacroiliac joint pain (Right)  Chronic abdominal pain (RLQ) (Primary Area of Pain) (Right)  Chronic lower extremity pain (Secondary Area of Pain) (Right)  Chronic low back pain with right-sided sciatica (Tertiary Area of Pain) (Bilateral) (R>L)  Problems Influencing Health Status  Pharmacologic Therapy  Disorder of Skeletal System   Time Note: Greater than  50% of the 40 minute(s) of face-to-face time spent with Ms. Glock, was spent in counseling/coordination of care regarding: the appropriate use of the pain scale, Ms. Trias's primary cause of pain, the results of her recent test(s), the significance of each one oth the test(s) anomalies and it's corresponding characteristic pain  pattern(s), the treatment plan, treatment alternatives, the risks and possible complications of proposed treatment, medication side effects, the opioid analgesic risks and possible complications, realistic expectations, the goals of pain management (increased in functionality), the medication agreement and the need to collect and read the AVS material.  Plan of Care  Pharmacotherapy (Medications Ordered): Meds ordered this encounter  Medications  . ergocalciferol (VITAMIN D2) 50000 units capsule    Sig: Take 1 capsule (50,000 Units total) by mouth 2 (two) times a week. X 6 weeks.    Dispense:  12 capsule    Refill:  0    Do not add this medication to the electronic "Automatic Refill" notification system. Patient may have prescription filled one day early if pharmacy is closed on scheduled refill date.  . Cholecalciferol (VITAMIN D3) 5000 units CAPS    Sig: Take 1 capsule (5,000 Units total) by mouth daily with breakfast. Take along with calcium and magnesium.    Dispense:  30 capsule    Refill:  5    Do not place medication on "Automatic Refill".  May substitute with similar over-the-counter product.  . Magnesium 500 MG CAPS    Sig: Take 1 capsule (500 mg total) by mouth 2 (two) times daily at 8 am and 10 pm.    Dispense:  60 capsule    Refill:  5    Do not place medication on "Automatic Refill".  The patient may use similar over-the-counter product.  . Calcium Carbonate-Vit D-Min (GNP CALCIUM 1200) 1200-1000 MG-UNIT CHEW    Sig: Chew 1,200 mg by mouth daily with breakfast. Take in combination with vitamin D and magnesium.    Dispense:  30 tablet    Refill:  5    Do not place medication on "Automatic Refill".  May substitute with similar over-the-counter product.  . traMADol (ULTRAM) 50 MG tablet    Sig: Take 1 tablet (50 mg total) by mouth every 6 (six) hours as needed for severe pain.    Dispense:  120 tablet    Refill:  0    Do not place this medication, or any other prescription  from our practice, on "Automatic Refill". Patient may have prescription filled one day early if pharmacy is closed on scheduled refill date. Do not fill until: 02/01/18 To last until: 03/03/18    Procedure Orders     SACROILIAC JOINT INJECTION Lab Orders  No laboratory test(s) ordered today    Imaging Orders     DG Si Joints Referral Orders  No referral(s) requested today    Pharmacological management options:  Opioid Analgesics: We'll take over management today. See above orders Membrane stabilizer: We have discussed the possibility of optimizing this mode of therapy, if tolerated Muscle relaxant: We have discussed the possibility of a trial NSAID: We have discussed the possibility of a trial Other analgesic(s): To be determined at a later time   Interventional management options: Planned, scheduled, and/or pending:    Diagnostic sacroiliac joint block #1 under fluoroscopic guidance and IV sedation   Considering:   Diagnostic celiac plexus nerve block  Diagnostic right-sided lumbar LESI  Diagnostic right-sided lumbar facet nerve block  Possible right-sided lumbar facet RFA  PRN Procedures:   None at this time   Provider-requested follow-up: Return for Procedure (w/ sedation): (R) SI BLK.  Future Appointments  Date Time Provider [place]  03/06/2018 11:15 AM Milinda Pointer, MD [facility] None    Primary Care Physician: [facility], [place] Location: [facility] Note by: Gaspar Cola, MD Date: 02/01/2018; Time: 9:53 AM

## 2018-02-01 ENCOUNTER — Other Ambulatory Visit: Payer: Self-pay

## 2018-02-01 ENCOUNTER — Ambulatory Visit: Payer: BLUE CROSS/BLUE SHIELD | Attending: Pain Medicine | Admitting: Pain Medicine

## 2018-02-01 ENCOUNTER — Ambulatory Visit
Admission: RE | Admit: 2018-02-01 | Discharge: 2018-02-01 | Disposition: A | Discharge status: Home / Self Care | Payer: BLUE CROSS/BLUE SHIELD | Source: Ambulatory Visit | Attending: Pain Medicine | Admitting: Pain Medicine

## 2018-02-01 ENCOUNTER — Encounter: Payer: Self-pay | Admitting: Pain Medicine

## 2018-02-01 VITALS — BP 126/72 | HR 66 | Temp 98.1°F | Ht 62.0 in | Wt 235.0 lb

## 2018-02-01 DIAGNOSIS — M533 Sacrococcygeal disorders, not elsewhere classified: Principal | ICD-10-CM

## 2018-02-01 DIAGNOSIS — M79604 Pain in right leg: Secondary | ICD-10-CM | POA: Diagnosis not present

## 2018-02-01 DIAGNOSIS — F329 Major depressive disorder, single episode, unspecified: Secondary | ICD-10-CM | POA: Insufficient documentation

## 2018-02-01 DIAGNOSIS — R1031 Right lower quadrant pain: Secondary | ICD-10-CM | POA: Diagnosis not present

## 2018-02-01 DIAGNOSIS — K219 Gastro-esophageal reflux disease without esophagitis: Secondary | ICD-10-CM | POA: Diagnosis not present

## 2018-02-01 DIAGNOSIS — M899 Disorder of bone, unspecified: Secondary | ICD-10-CM

## 2018-02-01 DIAGNOSIS — G8929 Other chronic pain: Secondary | ICD-10-CM | POA: Insufficient documentation

## 2018-02-01 DIAGNOSIS — Z79899 Other long term (current) drug therapy: Secondary | ICD-10-CM | POA: Diagnosis not present

## 2018-02-01 DIAGNOSIS — Z789 Other specified health status: Secondary | ICD-10-CM

## 2018-02-01 DIAGNOSIS — G894 Chronic pain syndrome: Secondary | ICD-10-CM | POA: Insufficient documentation

## 2018-02-01 DIAGNOSIS — Z87891 Personal history of nicotine dependence: Secondary | ICD-10-CM | POA: Diagnosis not present

## 2018-02-01 DIAGNOSIS — R700 Elevated erythrocyte sedimentation rate: Secondary | ICD-10-CM | POA: Diagnosis not present

## 2018-02-01 DIAGNOSIS — M5441 Lumbago with sciatica, right side: Secondary | ICD-10-CM

## 2018-02-01 DIAGNOSIS — Z79891 Long term (current) use of opiate analgesic: Secondary | ICD-10-CM | POA: Diagnosis not present

## 2018-02-01 DIAGNOSIS — Z5181 Encounter for therapeutic drug level monitoring: Secondary | ICD-10-CM | POA: Diagnosis not present

## 2018-02-01 DIAGNOSIS — E559 Vitamin D deficiency, unspecified: Secondary | ICD-10-CM | POA: Diagnosis not present

## 2018-02-01 DIAGNOSIS — F1190 Opioid use, unspecified, uncomplicated: Secondary | ICD-10-CM | POA: Diagnosis not present

## 2018-02-01 DIAGNOSIS — M5388 Other specified dorsopathies, sacral and sacrococcygeal region: Secondary | ICD-10-CM | POA: Diagnosis not present

## 2018-02-01 MED ORDER — ERGOCALCIFEROL 1.25 MG (50000 UT) PO CAPS: 50000.0000 [IU] | ORAL_CAPSULE | ORAL | 0 refills | Status: AC

## 2018-02-01 MED ORDER — TRAMADOL HCL 50 MG PO TABS: 50.0000 mg | ORAL_TABLET | Freq: Four times a day (QID) | ORAL | 0 refills | Status: DC | PRN

## 2018-02-01 MED ORDER — VITAMIN D3 125 MCG (5000 UT) PO CAPS: 1.0000 | ORAL_CAPSULE | Freq: Every day | ORAL | 5 refills | Status: AC

## 2018-02-01 MED ORDER — GNP CALCIUM 1200 1200-1000 MG-UNIT PO CHEW: 1200.0000 mg | CHEWABLE_TABLET | Freq: Every day | ORAL | 5 refills | Status: DC

## 2018-02-01 MED ORDER — MAGNESIUM 500 MG PO CAPS: 500.0000 mg | ORAL_CAPSULE | Freq: Two times a day (BID) | ORAL | 5 refills | Status: AC

## 2018-02-01 NOTE — Progress Notes (Signed)
Nursing Pain Medication Assessment:  Safety precautions to be maintained throughout the outpatient stay will include: orient to surroundings, keep bed in low position, maintain call bell within reach at all times, provide assistance with transfer out of bed and ambulation.  Medication Inspection Compliance: Sherry Watkins did not comply with our request to bring her pills to be counted. She was reminded that bringing the medication bottles, even when empty, is a requirement.  Medication: None brought in. Pill/Patch Count: None available to be counted. Bottle Appearance: No container available. Did not bring bottle(s) to appointment. Filled Date: N/A Last Medication intake:  Yesterday

## 2018-02-01 NOTE — Patient Instructions (Addendum)
____________________________________________________________________________________________  Pain Scale  Introduction: The pain score used by this practice is the Verbal Numerical Rating Scale (VNRS-11). This is an 11-point scale. It is for adults and children 10 years or older. There are significant differences in how the pain score is reported, used, and applied. Forget everything you learned in the past and learn this scoring system.  General Information: The scale should reflect your current level of pain. Unless you are specifically asked for the level of your worst pain, or your average pain. If you are asked for one of these two, then it should be understood that it is over the past 24 hours.  Basic Activities of Daily Living (ADL): Personal hygiene, dressing, eating, transferring, and using restroom.  Instructions: Most patients tend to report their level of pain as a combination of two factors, their physical pain and their psychosocial pain. This last one is also known as "suffering" and it is reflection of how physical pain affects you socially and psychologically. From now on, report them separately. From this point on, when asked to report your pain level, report only your physical pain. Use the following table for reference.  Pain Clinic Pain Levels (0-5/10)  Pain Level Score  Description  No Pain 0   Mild pain 1 Nagging, annoying, but does not interfere with basic activities of daily living (ADL). Patients are able to eat, bathe, get dressed, toileting (being able to get on and off the toilet and perform personal hygiene functions), transfer (move in and out of bed or a chair without assistance), and maintain continence (able to control bladder and bowel functions). Blood pressure and heart rate are unaffected. A normal heart rate for a healthy adult ranges from 60 to 100 bpm (beats per minute).   Mild to moderate pain 2 Noticeable and distracting. Impossible to hide from other  people. More frequent flare-ups. Still possible to adapt and function close to normal. It can be very annoying and may have occasional stronger flare-ups. With discipline, patients may get used to it and adapt.   Moderate pain 3 Interferes significantly with activities of daily living (ADL). It becomes difficult to feed, bathe, get dressed, get on and off the toilet or to perform personal hygiene functions. Difficult to get in and out of bed or a chair without assistance. Very distracting. With effort, it can be ignored when deeply involved in activities.   Moderately severe pain 4 Impossible to ignore for more than a few minutes. With effort, patients may still be able to manage work or participate in some social activities. Very difficult to concentrate. Signs of autonomic nervous system discharge are evident: dilated pupils (mydriasis); mild sweating (diaphoresis); sleep interference. Heart rate becomes elevated (>115 bpm). Diastolic blood pressure (lower number) rises above 100 mmHg. Patients find relief in laying down and not moving.   Severe pain 5 Intense and extremely unpleasant. Associated with frowning face and frequent crying. Pain overwhelms the senses.  Ability to do any activity or maintain social relationships becomes significantly limited. Conversation becomes difficult. Pacing back and forth is common, as getting into a comfortable position is nearly impossible. Pain wakes you up from deep sleep. Physical signs will be obvious: pupillary dilation; increased sweating; goosebumps; brisk reflexes; cold, clammy hands and feet; nausea, vomiting or dry heaves; loss of appetite; significant sleep disturbance with inability to fall asleep or to remain asleep. When persistent, significant weight loss is observed due to the complete loss of appetite and sleep deprivation.  Blood   pressure and heart rate becomes significantly elevated. Caution: If elevated blood pressure triggers a pounding headache  associated with blurred vision, then the patient should immediately seek attention at an urgent or emergency care unit, as these may be signs of an impending stroke.    Emergency Department Pain Levels (6-10/10)  Emergency Room Pain 6 Severely limiting. Requires emergency care and should not be seen or managed at an outpatient pain management facility. Communication becomes difficult and requires great effort. Assistance to reach the emergency department may be required. Facial flushing and profuse sweating along with potentially dangerous increases in heart rate and blood pressure will be evident.   Distressing pain 7 Self-care is very difficult. Assistance is required to transport, or use restroom. Assistance to reach the emergency department will be required. Tasks requiring coordination, such as bathing and getting dressed become very difficult.   Disabling pain 8 Self-care is no longer possible. At this level, pain is disabling. The individual is unable to do even the most "basic" activities such as walking, eating, bathing, dressing, transferring to a bed, or toileting. Fine motor skills are lost. It is difficult to think clearly.   Incapacitating pain 9 Pain becomes incapacitating. Thought processing is no longer possible. Difficult to remember your own name. Control of movement and coordination are lost.   The worst pain imaginable 10 At this level, most patients pass out from pain. When this level is reached, collapse of the autonomic nervous system occurs, leading to a sudden drop in blood pressure and heart rate. This in turn results in a temporary and dramatic drop in blood flow to the brain, leading to a loss of consciousness. Fainting is one of the body's self defense mechanisms. Passing out puts the brain in a calmed state and causes it to shut down for a while, in order to begin the healing process.    Summary: 1. Refer to this scale when providing us with your pain level. 2. Be  accurate and careful when reporting your pain level. This will help with your care. 3. Over-reporting your pain level will lead to loss of credibility. 4. Even a level of 1/10 means that there is pain and will be treated at our facility. 5. High, inaccurate reporting will be documented as "Symptom Exaggeration", leading to loss of credibility and suspicions of possible secondary gains such as obtaining more narcotics, or wanting to appear disabled, for fraudulent reasons. 6. Only pain levels of 5 or below will be seen at our facility. 7. Pain levels of 6 and above will be sent to the Emergency Department and the appointment cancelled. ____________________________________________________________________________________________   ____________________________________________________________________________________________  Preparing for Procedure with Sedation  Instructions: . Oral Intake: Do not eat or drink anything for at least 8 hours prior to your procedure. . Transportation: Public transportation is not allowed. Bring an adult driver. The driver must be physically present in our waiting room before any procedure can be started. . Physical Assistance: Bring an adult physically capable of assisting you, in the event you need help. This adult should keep you company at home for at least 6 hours after the procedure. . Blood Pressure Medicine: Take your blood pressure medicine with a sip of water the morning of the procedure. . Blood thinners:  . Diabetics on insulin: Notify the staff so that you can be scheduled 1st case in the morning. If your diabetes requires high dose insulin, take only  of your normal insulin dose the morning of the procedure and   notify the staff that you have done so. . Preventing infections: Shower with an antibacterial soap the morning of your procedure. . Build-up your immune system: Take 1000 mg of Vitamin C with every meal (3 times a day) the day prior to your  procedure. . Antibiotics: Inform the staff if you have a condition or reason that requires you to take antibiotics before dental procedures. . Pregnancy: If you are pregnant, call and cancel the procedure. . Sickness: If you have a cold, fever, or any active infections, call and cancel the procedure. . Arrival: You must be in the facility at least 30 minutes prior to your scheduled procedure. . Children: Do not bring children with you. . Dress appropriately: Bring dark clothing that you would not mind if they get stained. . Valuables: Do not bring any jewelry or valuables.  Procedure appointments are reserved for interventional treatments only. . No Prescription Refills. . No medication changes will be discussed during procedure appointments. . No disability issues will be discussed.  Remember:  Regular Business hours are:  Monday to Thursday 8:00 AM to 4:00 PM  Provider's Schedule: Mithra Spano, MD:  Procedure days: Tuesday and Thursday 7:30 AM to 4:00 PM  Bilal Lateef, MD:  Procedure days: Monday and Wednesday 7:30 AM to 4:00 PM ____________________________________________________________________________________________    

## 2018-02-06 ENCOUNTER — Ambulatory Visit: Payer: Medicaid Other | Admitting: Pain Medicine

## 2018-02-09 ENCOUNTER — Encounter: Payer: Self-pay | Admitting: Pain Medicine

## 2018-02-09 ENCOUNTER — Other Ambulatory Visit: Payer: Self-pay

## 2018-02-09 ENCOUNTER — Ambulatory Visit
Admission: RE | Admit: 2018-02-09 | Discharge: 2018-02-09 | Disposition: A | Discharge status: Home / Self Care | Payer: BLUE CROSS/BLUE SHIELD | Source: Ambulatory Visit | Attending: Pain Medicine | Admitting: Pain Medicine

## 2018-02-09 ENCOUNTER — Ambulatory Visit (HOSPITAL_BASED_OUTPATIENT_CLINIC_OR_DEPARTMENT_OTHER): Payer: BLUE CROSS/BLUE SHIELD | Admitting: Pain Medicine

## 2018-02-09 VITALS — BP 156/86 | HR 86 | Temp 98.0°F | Resp 16 | Ht 62.0 in | Wt 235.0 lb

## 2018-02-09 DIAGNOSIS — M533 Sacrococcygeal disorders, not elsewhere classified: Secondary | ICD-10-CM | POA: Insufficient documentation

## 2018-02-09 DIAGNOSIS — M545 Low back pain: Secondary | ICD-10-CM | POA: Diagnosis not present

## 2018-02-09 DIAGNOSIS — G8929 Other chronic pain: Secondary | ICD-10-CM | POA: Diagnosis not present

## 2018-02-09 DIAGNOSIS — M5388 Other specified dorsopathies, sacral and sacrococcygeal region: Secondary | ICD-10-CM | POA: Insufficient documentation

## 2018-02-09 MED ORDER — FENTANYL CITRATE (PF) 100 MCG/2ML IJ SOLN: 25.0000 ug | INTRAMUSCULAR | Status: DC | PRN

## 2018-02-09 MED ORDER — LACTATED RINGERS IV SOLN: 1000.0000 mL | Freq: Once | INTRAVENOUS | Status: DC

## 2018-02-09 MED ORDER — LIDOCAINE HCL 2 % IJ SOLN
20.0000 mL | Freq: Once | INTRAMUSCULAR | Status: AC
  Administered 2018-02-09: 400 mg
  Filled 2018-02-09: qty 40

## 2018-02-09 MED ORDER — MIDAZOLAM HCL 5 MG/5ML IJ SOLN: 1.0000 mg | INTRAMUSCULAR | Status: DC | PRN

## 2018-02-09 MED ORDER — ROPIVACAINE HCL 2 MG/ML IJ SOLN
4.0000 mL | Freq: Once | INTRAMUSCULAR | Status: AC
  Administered 2018-02-09: 10 mL via INTRA_ARTICULAR
  Filled 2018-02-09: qty 10

## 2018-02-09 MED ORDER — METHYLPREDNISOLONE ACETATE 80 MG/ML IJ SUSP
80.0000 mg | Freq: Once | INTRAMUSCULAR | Status: AC
  Administered 2018-02-09: 80 mg via INTRA_ARTICULAR
  Filled 2018-02-09: qty 1

## 2018-02-09 NOTE — Progress Notes (Signed)
Patient's Name: Sherry Watkins  MRN: [id number]  Referring Provider: [facility], [place] [facility]  DOB: 03-02-1981  PCP: [facility], [place]  DOS: 02/09/2018  Note by: Gaspar Cola, MD  Service setting: Ambulatory outpatient  Specialty: Interventional Pain Management  Patient type: Established  Location: [facility] (AMB) Pain Management Facility  Visit type: Interventional Procedure   Primary Reason for Visit: Interventional Pain Management Treatment. CC: Back Pain (low right)  Procedure:  Anesthesia, Analgesia, Anxiolysis:  Type: Diagnostic Sacroiliac Joint Steroid Injection #1  Region: Superior Lumbosacral Region Level: PSIS (Posterior Superior Iliac Spine) Laterality: Right-Sided  Type: Local Anesthesia Indication(s): Analgesia         Route: Infiltration ([place]/IM) IV Access: Declined Sedation: Declined  Local Anesthetic: Lidocaine 1-2%   Indications: 1. Other specified dorsopathies, sacral and sacrococcygeal region   2. Chronic sacroiliac joint pain (Right)   3. Chronic low back pain (Right) without sciatica    Pain Score: Pre-procedure: 5 /10 Post-procedure: 0-No pain/10  Pre-op Assessment:  Sherry Watkins is a 37 y.o. (year old), female patient, seen today for interventional treatment. She  has a past surgical history that includes Cesarean section; Tubal ligation; Hysteroscopy with novasure (N/A, 05/27/2015); Ablation; and laparoscopy (N/A, 07/07/2017). Sherry Watkins has a current medication list which includes the following prescription(s): acetaminophen, gnp calcium 1200, vitamin d3, ergocalciferol, magnesium, oxycodone-acetaminophen, and tramadol. Her primarily concern today is the Back Pain (low right)  Initial Vital Signs:  Pulse Rate: 78 Temp: 98 F (36.7 C) Resp: 16 BP: (!) 146/82 SpO2: 98 %  BMI: Estimated body mass index is 42.98 kg/m as calculated from the following:   Height as of this encounter: 5\' 2"  (1.575 m).   Weight as of this encounter: 235 lb  (106.6 kg).  Risk Assessment: Allergies: Reviewed. She has No Known Allergies.  Allergy Precautions: None required Coagulopathies: Reviewed. None identified.  Blood-thinner therapy: None at this time Active Infection(s): Reviewed. None identified. Sherry Watkins is afebrile  Site Confirmation: Sherry Watkins was asked to confirm the procedure and laterality before marking the site Procedure checklist: Completed Consent: Before the procedure and under the influence of no sedative(s), amnesic(s), or anxiolytics, the patient was informed of the treatment options, risks and possible complications. To fulfill our ethical and legal obligations, as recommended by the American Medical Association's Code of Ethics, I have informed the patient of my clinical impression; the nature and purpose of the treatment or procedure; the risks, benefits, and possible complications of the intervention; the alternatives, including doing nothing; the risk(s) and benefit(s) of the alternative treatment(s) or procedure(s); and the risk(s) and benefit(s) of doing nothing. The patient was provided information about the general risks and possible complications associated with the procedure. These may include, but are not limited to: failure to achieve desired goals, infection, bleeding, organ or nerve damage, allergic reactions, paralysis, and death. In addition, the patient was informed of those risks and complications associated to the procedure, such as failure to decrease pain; infection; bleeding; organ or nerve damage with subsequent damage to sensory, motor, and/or autonomic systems, resulting in permanent pain, numbness, and/or weakness of one or several areas of the body; allergic reactions; (i.e.: anaphylactic reaction); and/or death. Furthermore, the patient was informed of those risks and complications associated with the medications. These include, but are not limited to: allergic reactions (i.e.: anaphylactic or  anaphylactoid reaction(s)); adrenal axis suppression; blood sugar elevation that in diabetics may result in ketoacidosis or comma; water retention that in patients with history  of congestive heart failure may result in shortness of breath, pulmonary edema, and decompensation with resultant heart failure; weight gain; swelling or edema; medication-induced neural toxicity; particulate matter embolism and blood vessel occlusion with resultant organ, and/or nervous system infarction; and/or aseptic necrosis of one or more joints. Finally, the patient was informed that Medicine is not an exact science; therefore, there is also the possibility of unforeseen or unpredictable risks and/or possible complications that may result in a catastrophic outcome. The patient indicated having understood very clearly. We have given the patient no guarantees and we have made no promises. Enough time was given to the patient to ask questions, all of which were answered to the patient's satisfaction. Sherry Watkins has indicated that she wanted to continue with the procedure. Attestation: I, the ordering provider, attest that I have discussed with the patient the benefits, risks, side-effects, alternatives, likelihood of achieving goals, and potential problems during recovery for the procedure that I have provided informed consent. Date  Time: 02/09/2018  7:45 AM  Pre-Procedure Preparation:  Monitoring: As per clinic protocol. Respiration, ETCO2, SpO2, BP, heart rate and rhythm monitor placed and checked for adequate function Safety Precautions: Patient was assessed for positional comfort and pressure points before starting the procedure. Time-out: I initiated and conducted the "Time-out" before starting the procedure, as per protocol. The patient was asked to participate by confirming the accuracy of the "Time Out" information. Verification of the correct person, site, and procedure were performed and confirmed by me, the nursing  staff, and the patient. "Time-out" conducted as per Joint Commission's Universal Protocol (UP.01.01.01). Time: 0830  Description of Procedure:       Position: Prone Target Area: Superior, posterior, aspect of the sacroiliac fissure Approach: Posterior, paraspinal, ipsilateral approach. Area Prepped: Entire Lower Lumbosacral Region Prepping solution: ChloraPrep (2% chlorhexidine gluconate and 70% isopropyl alcohol) Safety Precautions: Aspiration looking for blood return was conducted prior to all injections. At no point did we inject any substances, as a needle was being advanced. No attempts were made at seeking any paresthesias. Safe injection practices and needle disposal techniques used. Medications properly checked for expiration dates. SDV (single dose vial) medications used. Description of the Procedure: Protocol guidelines were followed. The patient was placed in position over the procedure table. The target area was identified and the area prepped in the usual manner. Skin & deeper tissues infiltrated with local anesthetic. Appropriate amount of time allowed to pass for local anesthetics to take effect. The procedure needle was advanced under fluoroscopic guidance into the sacroiliac joint until a firm endpoint was obtained. Proper needle placement secured. Negative aspiration confirmed. Solution injected in intermittent fashion, asking for systemic symptoms every 0.5cc of injectate. The needles were then removed and the area cleansed, making sure to leave some of the prepping solution back to take advantage of its long term bactericidal properties. Vitals:   02/09/18 0830 02/09/18 0835 02/09/18 0840 02/09/18 0848  BP: (!) 182/99 (!) 184/102 (!) 154/84 (!) 156/86  Pulse: (!) 119 (!) 107 89 86  Resp: 20 19 18 16   Temp:      SpO2: 100% 100% 100% 100%  Weight:      Height:        Start Time: 0830 hrs. End Time: 0839 hrs. Materials:  Needle(s) Type: Regular needle Gauge: 22G Length:  3.5-in Medication(s): Please see orders for medications and dosing details.  Imaging Guidance (Non-Spinal):  Type of Imaging Technique: Fluoroscopy Guidance (Non-Spinal) Indication(s): Assistance in needle guidance and placement for  procedures requiring needle placement in or near specific anatomical locations not easily accessible without such assistance. Exposure Time: Please see nurses notes. Contrast: Before injecting any contrast, we confirmed that the patient did not have an allergy to iodine, shellfish, or radiological contrast. Once satisfactory needle placement was completed at the desired level, radiological contrast was injected. Contrast injected under live fluoroscopy. No contrast complications. See chart for type and volume of contrast used. Fluoroscopic Guidance: I was personally present during the use of fluoroscopy. "Tunnel Vision Technique" used to obtain the best possible view of the target area. Parallax error corrected before commencing the procedure. "Direction-depth-direction" technique used to introduce the needle under continuous pulsed fluoroscopy. Once target was reached, antero-posterior, oblique, and lateral fluoroscopic projection used confirm needle placement in all planes. Images permanently stored in EMR. Interpretation: I personally interpreted the imaging intraoperatively. Adequate needle placement confirmed in multiple planes. Appropriate spread of contrast into desired area was observed. No evidence of afferent or efferent intravascular uptake. Permanent images saved into the patient's record.  Antibiotic Prophylaxis:   Anti-infectives (From admission, onward)   None     Indication(s): None identified  Post-operative Assessment:  Post-procedure Vital Signs:  Pulse Rate: 86 Temp: 98 F (36.7 C) Resp: 16 BP: (!) 156/86 SpO2: 100 %  EBL: None  Complications: No immediate post-treatment complications observed by team, or reported by patient.  Note: The  patient tolerated the entire procedure well. A repeat set of vitals were taken after the procedure and the patient was kept under observation following institutional policy, for this type of procedure. Post-procedural neurological assessment was performed, showing return to baseline, prior to discharge. The patient was provided with post-procedure discharge instructions, including a section on how to identify potential problems. Should any problems arise concerning this procedure, the patient was given instructions to immediately contact us, at any time, without hesitation. In any case, we plan to contact the patient by telephone for a follow-up status report regarding this interventional procedure.  Comments:  No additional relevant information.  Plan of Care    Imaging Orders     DG C-Arm 1-60 Min-No Report  Procedure Orders     SACROILIAC JOINT INJECTION     SACROILIAC JOINT INJECTION  Medications ordered for procedure: Meds ordered this encounter  Medications  . lidocaine (XYLOCAINE) 2 % (with pres) injection 400 mg  . ropivacaine (PF) 2 mg/mL (0.2%) (NAROPIN) injection 4 mL  . methylPREDNISolone acetate (DEPO-MEDROL) injection 80 mg   Medications administered: We administered lidocaine, ropivacaine (PF) 2 mg/mL (0.2%), and methylPREDNISolone acetate.  See the medical record for exact dosing, route, and time of administration.  New Prescriptions   No medications on file   Disposition: Discharge home  Discharge Date & Time: 02/09/2018; 0848 hrs.   Physician-requested Follow-up: Return for post-procedure eval (2 wks), w/ Dr. Dossie Arbour.  Future Appointments  Date Time Provider [place]  03/06/2018 11:15 AM Milinda Pointer, MD [facility] None   Primary Care Physician: [facility], [place] Location: [facility] Facility Note by: Gaspar Cola, MD Date: 02/09/2018; Time: 9:40 AM  Disclaimer:  Medicine is not an Chief Strategy Officer.  The only guarantee in medicine is that nothing is guaranteed. It is important to note that the decision to proceed with this intervention was based on the information collected from the patient. The Data and conclusions were drawn from the patient's questionnaire, the interview, and the physical examination. Because the information was provided in large part by the  patient, it cannot be guaranteed that it has not been purposely or unconsciously manipulated. Every effort has been made to obtain as much relevant data as possible for this evaluation. It is important to note that the conclusions that lead to this procedure are derived in large part from the available data. Always take into account that the treatment will also be dependent on availability of resources and existing treatment guidelines, considered by other Pain Management Practitioners as being common knowledge and practice, at the time of the intervention. For Medico-Legal purposes, it is also important to point out that variation in procedural techniques and pharmacological choices are the acceptable norm. The indications, contraindications, technique, and results of the above procedure should only be interpreted and judged by a Board-Certified Interventional Pain Specialist with extensive familiarity and expertise in the same exact procedure and technique.

## 2018-02-09 NOTE — Patient Instructions (Signed)

## 2018-02-09 NOTE — Progress Notes (Signed)
Safety precautions to be maintained throughout the outpatient stay will include: orient to surroundings, keep bed in low position, maintain call bell within reach at all times, provide assistance with transfer out of bed and ambulation.  

## 2018-02-10 ENCOUNTER — Telehealth: Payer: Self-pay | Admitting: *Deleted

## 2018-02-10 NOTE — Telephone Encounter (Signed)
Voicemail left for patient to call our office if there are questions or concerns re; procedure on yesterday.  

## 2018-02-13 ENCOUNTER — Telehealth: Payer: Self-pay | Admitting: Pain Medicine

## 2018-02-13 NOTE — Telephone Encounter (Signed)
Left voicemail with Lou Cal to call me back for additional information that is being requested.

## 2018-02-13 NOTE — Telephone Encounter (Signed)
Voicemail left with patient to please call with additional information.

## 2018-02-13 NOTE — Telephone Encounter (Signed)
Patient needs work limits explained or faxed to work, stating that this is intermittent when she has a flare up.  Work Fax [phone]  Phone [phone] ex [address]

## 2018-02-17 NOTE — Telephone Encounter (Signed)
Attempted to call Sherry Watkins.  Messge states she is out of the office until next week.

## 2018-02-17 NOTE — Telephone Encounter (Signed)
Please try to call [employer] at [phone] ex [phone] to find out what they need so patient can get back to full time work unless she is having pain episodes.

## 2018-02-27 ENCOUNTER — Telehealth: Payer: Self-pay | Admitting: Pain Medicine

## 2018-02-27 NOTE — Telephone Encounter (Signed)
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 She will be there until 5pm today or can call back on Wednesday.  Phone [phone] at [phone]

## 2018-02-28 NOTE — Telephone Encounter (Signed)
Will wait for return call since she will not be available until Wednesday.

## 2018-02-28 NOTE — Telephone Encounter (Signed)
Ms. Grandville Silos wants Nurse to call back on Wed.

## 2018-03-06 ENCOUNTER — Ambulatory Visit: Payer: BLUE CROSS/BLUE SHIELD | Admitting: Pain Medicine

## 2018-03-13 ENCOUNTER — Telehealth: Payer: Self-pay

## 2018-03-13 NOTE — Telephone Encounter (Signed)
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 Kyrsten states letter that [place] received say's she cant at all work due to pain, Carmelle wants [place] to call Juliann Pulse and clarify that she can work when she's not in pain. She's afraid of loosing her job with letter stating she cant work at all.

## 2018-03-14 ENCOUNTER — Other Ambulatory Visit: Payer: Self-pay | Admitting: Pain Medicine

## 2018-03-14 DIAGNOSIS — E559 Vitamin D deficiency, unspecified: Secondary | ICD-10-CM

## 2018-03-27 ENCOUNTER — Ambulatory Visit: Payer: BLUE CROSS/BLUE SHIELD | Admitting: Pain Medicine

## 2018-05-08 ENCOUNTER — Emergency Department: Payer: BLUE CROSS/BLUE SHIELD

## 2018-05-08 ENCOUNTER — Emergency Department
Admission: EM | Admit: 2018-05-08 | Discharge: 2018-05-08 | Disposition: A | Discharge status: Home / Self Care | Payer: BLUE CROSS/BLUE SHIELD | Attending: Emergency Medicine | Admitting: Emergency Medicine

## 2018-05-08 ENCOUNTER — Other Ambulatory Visit: Payer: Self-pay

## 2018-05-08 DIAGNOSIS — S63642A Sprain of metacarpophalangeal joint of left thumb, initial encounter: Secondary | ICD-10-CM | POA: Diagnosis not present

## 2018-05-08 DIAGNOSIS — Y939 Activity, unspecified: Secondary | ICD-10-CM | POA: Diagnosis not present

## 2018-05-08 DIAGNOSIS — R030 Elevated blood-pressure reading, without diagnosis of hypertension: Secondary | ICD-10-CM | POA: Diagnosis not present

## 2018-05-08 DIAGNOSIS — Y999 Unspecified external cause status: Secondary | ICD-10-CM | POA: Insufficient documentation

## 2018-05-08 DIAGNOSIS — Z79899 Other long term (current) drug therapy: Secondary | ICD-10-CM | POA: Diagnosis not present

## 2018-05-08 DIAGNOSIS — W19XXXA Unspecified fall, initial encounter: Secondary | ICD-10-CM | POA: Insufficient documentation

## 2018-05-08 DIAGNOSIS — Y929 Unspecified place or not applicable: Secondary | ICD-10-CM | POA: Diagnosis not present

## 2018-05-08 DIAGNOSIS — Z87891 Personal history of nicotine dependence: Secondary | ICD-10-CM | POA: Diagnosis not present

## 2018-05-08 DIAGNOSIS — M79645 Pain in left finger(s): Secondary | ICD-10-CM | POA: Diagnosis not present

## 2018-05-08 DIAGNOSIS — S6992XA Unspecified injury of left wrist, hand and finger(s), initial encounter: Secondary | ICD-10-CM | POA: Diagnosis present

## 2018-05-08 MED ORDER — IBUPROFEN 600 MG PO TABS: 600.0000 mg | ORAL_TABLET | Freq: Three times a day (TID) | ORAL | 0 refills | Status: DC | PRN

## 2018-05-08 MED ORDER — IBUPROFEN 600 MG PO TABS
600.0000 mg | ORAL_TABLET | Freq: Once | ORAL | Status: AC
  Administered 2018-05-08: 600 mg via ORAL
  Filled 2018-05-08: qty 1

## 2018-05-08 NOTE — ED Notes (Signed)
See triage note  presents with pain and swelling to left thumb  States she thinks she may have jammed her thumb  Good sensation

## 2018-05-08 NOTE — ED Triage Notes (Signed)
"  I think I jammed my thumb."  Reports pain to left thumb just prior to arrival.

## 2018-05-08 NOTE — ED Provider Notes (Signed)
[facility] Emergency Department Provider Note   ____________________________________________   First MD Initiated Contact with Patient 05/08/18 [phone]     (approximate)  I have reviewed the triage vital signs and the nursing notes.   HISTORY  Chief Complaint Finger Injury   HPI Sherry Watkins is a 37 y.o. female is here with complaint of left thumb pain since falling this morning.  Patient denies any head injury or loss of consciousness.  She states she fell onto her left hand and has had thumb pain since that time.  She states that her thumb feels "jammed".  She has not taken any over-the-counter medication prior to arrival.  She rates her pain as an 8 out of 10.  She is also up-to-date on tetanus immunization.  Past Medical History:  Diagnosis Date  . Complication of anesthesia   . Depression   . GERD (gastroesophageal reflux disease)    NO MEDS  . Headache    FREQUENTLY  . Ovarian cyst     Patient Active Problem List   Diagnosis Date Noted  . Chronic low back pain (Right) without sciatica 02/09/2018  . Elevated sed rate 02/01/2018  . Vitamin D deficiency 02/01/2018  . Chronic sacroiliac joint pain (Right) 02/01/2018  . Other specified dorsopathies, sacral and sacrococcygeal region 02/01/2018  . Chronic abdominal pain (RLQ) (Primary Area of Pain) (Right) 12/26/2017  . Chronic lower extremity pain (Secondary Area of Pain) (Right) 12/26/2017  . Chronic low back pain with right-sided sciatica [facility] Area of Pain) (Bilateral) (R>L) 12/26/2017  . Chronic pain syndrome 12/26/2017  . Opiate use 12/26/2017  . Problems influencing health status 12/26/2017  . Pharmacologic therapy 12/26/2017  . Disorder of skeletal system 12/26/2017    Past Surgical History:  Procedure Laterality Date  . ABLATION    . CESAREAN SECTION    . HYSTEROSCOPY WITH NOVASURE N/A 05/27/2015   Procedure: HYSTEROSCOPY WITH NOVASURE;  Surgeon: Aletha Halim, MD;  Location:  [facility] ORS;  Service: Gynecology;  Laterality: N/A;  . LAPAROSCOPY N/A 07/07/2017   Procedure: LAPAROSCOPY DIAGNOSTIC;  Surgeon: Malachy Mood, MD;  Location: [facility] ORS;  Service: Gynecology;  Laterality: N/A;  . TUBAL LIGATION      Prior to Admission medications   Medication Sig Start Date End Date Taking? Authorizing Provider  acetaminophen (TYLENOL) 500 MG tablet Take 1,000 mg by mouth every 6 (six) hours as needed.    [provider]  Calcium Carbonate-Vit D-Min (GNP CALCIUM 1200) 1200-1000 MG-UNIT CHEW Chew 1,200 mg by mouth daily with breakfast. Take in combination with vitamin D and magnesium. 02/01/18 07/31/18  Milinda Pointer, MD  Cholecalciferol (VITAMIN D3) 5000 units CAPS Take 1 capsule (5,000 Units total) by mouth daily with breakfast. Take along with calcium and magnesium. 02/01/18 07/31/18  Milinda Pointer, MD  ibuprofen (ADVIL,MOTRIN) 600 MG tablet Take 1 tablet (600 mg total) by mouth every 8 (eight) hours as needed. 05/08/18   Johnn Hai, PA-C  Magnesium 500 MG CAPS Take 1 capsule (500 mg total) by mouth 2 (two) times daily at 8 am and 10 pm. 02/01/18 07/31/18  Milinda Pointer, MD  oxyCODONE-acetaminophen (PERCOCET/ROXICET) 5-325 MG tablet Take 1 tablet by mouth every 4 (four) hours as needed for severe pain. 01/27/18   Darel Hong, MD  traMADol (ULTRAM) 50 MG tablet Take 1 tablet (50 mg total) by mouth every 6 (six) hours as needed for severe pain. 02/01/18 03/03/18  Milinda Pointer, MD    Allergies Patient has no known  allergies.  Family History  Problem Relation Age of Onset  . Diabetes Mother   . Hypertension Mother   . Heart disease Father   . Cancer Father   . Heart disease Brother     Social History Social History   Tobacco Use  . Smoking status: Former Smoker    Years: 15.00    Types: Cigarettes    Last attempt to quit: 06/30/2004    Years since quitting: 13.8  . Smokeless tobacco: Never Used  Substance Use Topics  . Alcohol use: Yes      Comment: OCC  . Drug use: No    Review of Systems Constitutional: No fever/chills Eyes: No visual changes. ENT: No trauma. Cardiovascular: Denies chest pain. Respiratory: Denies shortness of breath. Musculoskeletal: Positive for left thumb pain. Skin: Positive for superficial abrasion left thumb. Neurological: Negative for headaches, focal weakness or numbness. ___________________________________________   PHYSICAL EXAM:  VITAL SIGNS: ED Triage Vitals  Enc Vitals Group     BP 05/08/18 0544 (!) 164/104     Pulse Rate 05/08/18 0544 (!) 118     Resp 05/08/18 0544 18     Temp 05/08/18 0544 98.8 F (37.1 C)     Temp Source 05/08/18 0544 Oral     SpO2 05/08/18 0544 97 %     Weight 05/08/18 0542 228 lb (103.4 kg)     Height 05/08/18 0542 5\' 2"  (1.575 m)     Head Circumference --      Peak Flow --      Pain Score 05/08/18 0541 8     Pain Loc --      Pain Edu? --      Excl. in [place]? --    Constitutional: Alert and oriented. Well appearing and in no acute distress. Eyes: Conjunctivae are normal.  Head: Atraumatic. Nose: No trauma. Neck: No stridor.   Cardiovascular: Normal rate, regular rhythm. Grossly normal heart sounds.  Good peripheral circulation. Respiratory: Normal respiratory effort.  No retractions. Lungs CTAB. Musculoskeletal: On examination of the left hand there is no gross deformity noted of the left thumb.  Patient is able to flex and extend with some discomfort.  Motor sensory function intact.  No soft tissue edema is appreciated. Neurologic:  Normal speech and language. No gross focal neurologic deficits are appreciated. No gait instability. Skin:  Skin is warm, dry.  Very superficial 0.2 cm laceration without active bleeding noted to the left thumb. Psychiatric: Mood and affect are normal. Speech and behavior are normal.  ____________________________________________   LABS (all labs ordered are listed, but only abnormal results are displayed)  Labs  Reviewed - No data to display  RADIOLOGY  ED MD interpretation:   Left thumb without fracture.  Official radiology report(s): Dg Finger Thumb Left  Result Date: 05/08/2018 CLINICAL DATA:  Pain in the left thumb.  Jammed the thumb. EXAM: LEFT THUMB 2+V COMPARISON:  03/14/2012 FINDINGS: There is no evidence of fracture or dislocation. There is no evidence of arthropathy or other focal bone abnormality. Soft tissues are unremarkable IMPRESSION: Negative. Electronically Signed   By: Lucienne Capers M.D.   On: 05/08/2018 06:10    ____________________________________________   PROCEDURES  Procedure(s) performed: None  Procedures  Critical Care performed: No  ____________________________________________   INITIAL IMPRESSION / ASSESSMENT AND PLAN / ED COURSE  As part of my medical decision making, I reviewed the following data within the electronic MEDICAL RECORD NUMBER Notes from prior ED visits and [place] Controlled Substance Database  Patient was placed in a finger splint and given ibuprofen for inflammation and pain.  She is also encouraged to follow-up with her PCP at [facility] for recheck of her blood pressure which was elevated today at 143/100.  ____________________________________________   FINAL CLINICAL IMPRESSION(S) / ED DIAGNOSES  Final diagnoses:  Sprain of metacarpophalangeal (MCP) joint of left thumb, initial encounter  Elevated blood pressure reading     ED Discharge Orders        Ordered    ibuprofen (ADVIL,MOTRIN) 600 MG tablet  Every 8 hours PRN     05/08/18 0728       Note:  This document was prepared using Dragon voice recognition software and may include unintentional dictation errors.    Johnn Hai, PA-C 05/08/18 1121    Schaevitz, Randall An, MD 05/08/18 [id number]

## 2018-05-08 NOTE — Discharge Instructions (Signed)
Follow-up with [facility] for recheck of your thumb and also to recheck your blood pressure.  Blood pressure was elevated today with the bottom number being 104.  Call today to make an appointment to be checked in 7 to 10 days for your blood pressure.  Watch abrasion for any signs of infection and clean daily with mild soap and water.  Wear splint as needed for protection.

## 2018-05-17 ENCOUNTER — Ambulatory Visit: Payer: BLUE CROSS/BLUE SHIELD | Admitting: Pain Medicine

## 2018-06-06 ENCOUNTER — Other Ambulatory Visit: Payer: Self-pay

## 2018-06-06 ENCOUNTER — Emergency Department
Admission: EM | Admit: 2018-06-06 | Discharge: 2018-06-07 | Disposition: A | Discharge status: Home / Self Care | Payer: BLUE CROSS/BLUE SHIELD | Attending: Emergency Medicine | Admitting: Emergency Medicine

## 2018-06-06 ENCOUNTER — Encounter: Payer: Self-pay | Admitting: Emergency Medicine

## 2018-06-06 DIAGNOSIS — N809 Endometriosis, unspecified: Secondary | ICD-10-CM

## 2018-06-06 DIAGNOSIS — Z87891 Personal history of nicotine dependence: Secondary | ICD-10-CM | POA: Diagnosis not present

## 2018-06-06 DIAGNOSIS — R52 Pain, unspecified: Secondary | ICD-10-CM

## 2018-06-06 DIAGNOSIS — R102 Pelvic and perineal pain: Secondary | ICD-10-CM | POA: Diagnosis not present

## 2018-06-06 DIAGNOSIS — N8003 Adenomyosis of the uterus: Secondary | ICD-10-CM

## 2018-06-06 DIAGNOSIS — G8929 Other chronic pain: Secondary | ICD-10-CM | POA: Insufficient documentation

## 2018-06-06 DIAGNOSIS — N800 Endometriosis of uterus: Secondary | ICD-10-CM | POA: Insufficient documentation

## 2018-06-06 LAB — URINALYSIS, COMPLETE (UACMP) WITH MICROSCOPIC
BACTERIA UA: NONE SEEN
Bilirubin Urine: NEGATIVE
GLUCOSE, UA: NEGATIVE mg/dL
KETONES UR: NEGATIVE mg/dL
Leukocytes, UA: NEGATIVE
Nitrite: NEGATIVE
PH: 7 (ref 5.0–8.0)
Protein, ur: NEGATIVE mg/dL
Specific Gravity, Urine: 1.017 (ref 1.005–1.030)

## 2018-06-06 LAB — COMPREHENSIVE METABOLIC PANEL
ALBUMIN: 4.1 g/dL (ref 3.5–5.0)
ALT: 43 U/L (ref 0–44)
ANION GAP: 8 (ref 5–15)
AST: 40 U/L (ref 15–41)
Alkaline Phosphatase: 52 U/L (ref 38–126)
BILIRUBIN TOTAL: 0.5 mg/dL (ref 0.3–1.2)
BUN: 5 mg/dL — AB (ref 6–20)
CHLORIDE: 109 mmol/L (ref 98–111)
CO2: 23 mmol/L (ref 22–32)
Calcium: 9.1 mg/dL (ref 8.9–10.3)
Creatinine, Ser: 0.78 mg/dL (ref 0.44–1.00)
GFR calc Af Amer: >60 mL/min (ref >60)
GFR calc non Af Amer: >60 mL/min (ref >60)
GLUCOSE: 108 mg/dL — AB (ref 70–99)
POTASSIUM: 3.3 mmol/L — AB (ref 3.5–5.1)
SODIUM: 140 mmol/L (ref 135–145)
Total Protein: 8.1 g/dL (ref 6.5–8.1)

## 2018-06-06 LAB — CBC
HEMATOCRIT: 43 % (ref 35.0–47.0)
HEMOGLOBIN: 14.5 g/dL (ref 12.0–16.0)
MCH: 30.9 pg (ref 26.0–34.0)
MCHC: 33.7 g/dL (ref 32.0–36.0)
MCV: 91.6 fL (ref 80.0–100.0)
Platelets: 344 10*3/uL (ref 150–440)
RBC: 4.69 MIL/uL (ref 3.80–5.20)
RDW: 13.7 % (ref 11.5–14.5)
WBC: 9.2 10*3/uL (ref 3.6–11.0)

## 2018-06-06 LAB — LIPASE, BLOOD: LIPASE: 39 U/L (ref 11–51)

## 2018-06-06 NOTE — ED Triage Notes (Signed)
Pt presents to ED with sharp lower abd pain that has been ongoing intermittently for several years. Current symptoms started today. Pain seems to come every couple months and lasts 4-5 days at a time. Denies n/v/d. Exploratory surgery was performed in august but pt states no abnormalities were found. Pt states she has been going to the pain clinic but no doctors have been able to tell her what is causing the pain.

## 2018-06-07 ENCOUNTER — Emergency Department: Payer: BLUE CROSS/BLUE SHIELD

## 2018-06-07 DIAGNOSIS — R102 Pelvic and perineal pain: Secondary | ICD-10-CM | POA: Diagnosis not present

## 2018-06-07 LAB — POCT PREGNANCY, URINE: PREG TEST UR: NEGATIVE

## 2018-06-07 MED ORDER — POTASSIUM CHLORIDE CRYS ER 20 MEQ PO TBCR
40.0000 meq | EXTENDED_RELEASE_TABLET | Freq: Once | ORAL | Status: AC
Start: 2018-06-07 — End: 2018-06-07
  Administered 2018-06-07: 40 meq via ORAL
  Filled 2018-06-07: qty 2

## 2018-06-07 MED ORDER — NAPROXEN 500 MG PO TABS: 500.0000 mg | ORAL_TABLET | Freq: Two times a day (BID) | ORAL | 0 refills | Status: DC

## 2018-06-07 MED ORDER — FENTANYL CITRATE (PF) 100 MCG/2ML IJ SOLN
50.0000 ug | Freq: Once | INTRAMUSCULAR | Status: AC
  Administered 2018-06-07: 50 ug via INTRAVENOUS
  Filled 2018-06-07: qty 2

## 2018-06-07 MED ORDER — LORAZEPAM 2 MG/ML IJ SOLN
0.5000 mg | Freq: Once | INTRAMUSCULAR | Status: AC
  Administered 2018-06-07: 0.5 mg via INTRAVENOUS
  Filled 2018-06-07: qty 1

## 2018-06-07 NOTE — Discharge Instructions (Addendum)
1.  Continue pain medicine as directed by your pain clinic doctor. 2.  You may take Naprosyn twice daily for pain. 3.  Return to the ER for worsening symptoms, persistent vomiting, difficulty breathing or other concerns.

## 2018-06-07 NOTE — ED Provider Notes (Signed)
[facility] Emergency Department Provider Note   ____________________________________________   First MD Initiated Contact with Patient 06/07/18 0016     (approximate)  I have reviewed the triage vital signs and the nursing notes.   HISTORY  Chief Complaint Abdominal Pain and Pain    HPI REMY DIA is a 37 y.o. female who presents to the ED from home with a chief complaint of acute on chronic right pelvic pain.  She reports increasing pain after sexual intercourse yesterday morning.  She is seen at the pain clinic and receives tramadol every month for pelvic pain.  She had exploratory surgery by [facility].  Patient is frustrated because no one has been able to tell her the cause of her pain.  Denies associated fever, chills, chest pain, shortness of breath, nausea, vomiting, dysuria, diarrhea.  Denies recent travel or trauma.   Past Medical History:  Diagnosis Date  . Complication of anesthesia   . Depression   . GERD (gastroesophageal reflux disease)    NO MEDS  . Headache    FREQUENTLY  . Ovarian cyst     Patient Active Problem List   Diagnosis Date Noted  . Chronic low back pain (Right) without sciatica 02/09/2018  . Elevated sed rate 02/01/2018  . Vitamin D deficiency 02/01/2018  . Chronic sacroiliac joint pain (Right) 02/01/2018  . Other specified dorsopathies, sacral and sacrococcygeal region 02/01/2018  . Chronic abdominal pain (RLQ) (Primary Area of Pain) (Right) 12/26/2017  . Chronic lower extremity pain (Secondary Area of Pain) (Right) 12/26/2017  . Chronic low back pain with right-sided sciatica [facility] Area of Pain) (Bilateral) (R>L) 12/26/2017  . Chronic pain syndrome 12/26/2017  . Opiate use 12/26/2017  . Problems influencing health status 12/26/2017  . Pharmacologic therapy 12/26/2017  . Disorder of skeletal system 12/26/2017    Past Surgical History:  Procedure Laterality Date  . ABLATION    . CESAREAN SECTION     . HYSTEROSCOPY WITH NOVASURE N/A 05/27/2015   Procedure: HYSTEROSCOPY WITH NOVASURE;  Surgeon: Aletha Halim, MD;  Location: [facility] ORS;  Service: Gynecology;  Laterality: N/A;  . LAPAROSCOPY N/A 07/07/2017   Procedure: LAPAROSCOPY DIAGNOSTIC;  Surgeon: Malachy Mood, MD;  Location: [facility] ORS;  Service: Gynecology;  Laterality: N/A;  . TUBAL LIGATION      Prior to Admission medications   Medication Sig Start Date End Date Taking? Authorizing Provider  acetaminophen (TYLENOL) 500 MG tablet Take 1,000 mg by mouth every 6 (six) hours as needed.    [provider]  Calcium Carbonate-Vit D-Min (GNP CALCIUM 1200) 1200-1000 MG-UNIT CHEW Chew 1,200 mg by mouth daily with breakfast. Take in combination with vitamin D and magnesium. 02/01/18 07/31/18  Milinda Pointer, MD  Cholecalciferol (VITAMIN D3) 5000 units CAPS Take 1 capsule (5,000 Units total) by mouth daily with breakfast. Take along with calcium and magnesium. 02/01/18 07/31/18  Milinda Pointer, MD  ibuprofen (ADVIL,MOTRIN) 600 MG tablet Take 1 tablet (600 mg total) by mouth every 8 (eight) hours as needed. 05/08/18   Johnn Hai, PA-C  Magnesium 500 MG CAPS Take 1 capsule (500 mg total) by mouth 2 (two) times daily at 8 am and 10 pm. 02/01/18 07/31/18  Milinda Pointer, MD  naproxen (NAPROSYN) 500 MG tablet Take 1 tablet (500 mg total) by mouth 2 (two) times daily with a meal. 06/07/18   Paulette Blanch, MD  oxyCODONE-acetaminophen (PERCOCET/ROXICET) 5-325 MG tablet Take 1 tablet by mouth every 4 (four) hours as needed for  severe pain. 01/27/18   Darel Hong, MD  traMADol (ULTRAM) 50 MG tablet Take 1 tablet (50 mg total) by mouth every 6 (six) hours as needed for severe pain. 02/01/18 03/03/18  Milinda Pointer, MD    Allergies Patient has no known allergies.  Family History  Problem Relation Age of Onset  . Diabetes Mother   . Hypertension Mother   . Heart disease Father   . Cancer Father   . Heart disease Brother      Social History Social History   Tobacco Use  . Smoking status: Former Smoker    Years: 15.00    Types: Cigarettes    Last attempt to quit: 06/30/2004    Years since quitting: 13.9  . Smokeless tobacco: Never Used  Substance Use Topics  . Alcohol use: Yes    Comment: OCC  . Drug use: No    Review of Systems  Constitutional: No fever/chills Eyes: No visual changes. ENT: No sore throat. Cardiovascular: Denies chest pain. Respiratory: Denies shortness of breath. Gastrointestinal: Positive for chronic right pelvic pain.  No abdominal pain.  No nausea, no vomiting.  No diarrhea.  No constipation. Genitourinary: Negative for dysuria. Musculoskeletal: Negative for back pain. Skin: Negative for rash. Neurological: Negative for headaches, focal weakness or numbness.   ____________________________________________   PHYSICAL EXAM:  VITAL SIGNS: ED Triage Vitals  Enc Vitals Group     BP 06/06/18 2223 (!) 166/73     Pulse Rate 06/06/18 2223 83     Resp 06/06/18 2223 18     Temp 06/06/18 2223 98.7 F (37.1 C)     Temp Source 06/06/18 2223 Oral     SpO2 06/06/18 2223 99 %     Weight 06/06/18 2224 228 lb (103.4 kg)     Height 06/06/18 2224 5\' 2"  (1.575 m)     Head Circumference --      Peak Flow --      Pain Score 06/06/18 2223 10     Pain Loc --      Pain Edu? --      Excl. in [place]? --     Constitutional: Alert and oriented. Well appearing and in mild acute distress. Eyes: Conjunctivae are normal. PERRL. EOMI. Head: Atraumatic. Nose: No congestion/rhinnorhea. Mouth/Throat: Mucous membranes are moist.  Oropharynx non-erythematous. Neck: No stridor.   Cardiovascular: Normal rate, regular rhythm. Grossly normal heart sounds.  Good peripheral circulation. Respiratory: Normal respiratory effort.  No retractions. Lungs CTAB. Gastrointestinal: Soft and mildly tender to palpation right adnexa without rebound or guarding.  No pain at McBurney's point. No distention. No  abdominal bruits. No CVA tenderness. Musculoskeletal: No lower extremity tenderness nor edema.  No joint effusions. Neurologic:  Normal speech and language. No gross focal neurologic deficits are appreciated. No gait instability. Skin:  Skin is warm, dry and intact. No rash noted. Psychiatric: Mood and affect are normal. Speech and behavior are normal.  ____________________________________________   LABS (all labs ordered are listed, but only abnormal results are displayed)  Labs Reviewed  COMPREHENSIVE METABOLIC PANEL - Abnormal; Notable for the following components:      Result Value   Potassium 3.3 (*)    Glucose, Bld 108 (*)    BUN 5 (*)    All other components within normal limits  URINALYSIS, COMPLETE (UACMP) WITH MICROSCOPIC - Abnormal; Notable for the following components:   Color, Urine YELLOW (*)    APPearance CLEAR (*)    Hgb urine dipstick SMALL (*)  All other components within normal limits  LIPASE, BLOOD  CBC  POC URINE PREG, ED  POCT PREGNANCY, URINE   ____________________________________________  EKG  None ____________________________________________  RADIOLOGY  ED MD interpretation:  adenomyosis  Official radiology report(s): US Pelvis Transvanginal Non-ob (tv Only)  Result Date: 06/07/2018 CLINICAL DATA:  37 year old female with pelvic pain. EXAM: TRANSABDOMINAL AND TRANSVAGINAL ULTRASOUND OF PELVIS DOPPLER ULTRASOUND OF OVARIES TECHNIQUE: Both transabdominal and transvaginal ultrasound examinations of the pelvis were performed. Transabdominal technique was performed for global imaging of the pelvis including uterus, ovaries, adnexal regions, and pelvic cul-de-sac. It was necessary to proceed with endovaginal exam following the transabdominal exam to visualize the endometrium and ovaries. Color and duplex Doppler ultrasound was utilized to evaluate blood flow to the ovaries. COMPARISON:  None. FINDINGS: Uterus Measurements: 8.0 x 3.9 x 3.9 cm. The uterus  is anteverted and slightly heterogeneous likely related to underlying adenomyosis. Endometrium Thickness: 4 mm. There is a 7 mm echogenic area with posterior shadowing in the upper endometrium which may represent an area of calcification. This is of indeterminate etiology but may be related to prior scarring or inflammatory process. Right ovary Measurements: 2.9 x 1.5 x 2.0 cm. Normal appearance/no adnexal mass. Left ovary Measurements: 2.5 x 1.9 x 2.1 cm. Normal appearance/no adnexal mass. Pulsed Doppler evaluation of both ovaries demonstrates normal low-resistance arterial and venous waveforms. Other findings No abnormal free fluid. IMPRESSION: 1. Heterogeneous uterus with findings suggestive of adenomyosis. 2. Focal area of calcification in the upper endometrium of indeterminate etiology, likely related to prior inflammation or scarring. Clinical correlation is recommended. 3. Unremarkable ovaries with bilateral Doppler detected flow. Electronically Signed   By: Anner Crete M.D.   On: 06/07/2018 02:10   US Pelvis Complete  Result Date: 06/07/2018 CLINICAL DATA:  37 year old female with pelvic pain. EXAM: TRANSABDOMINAL AND TRANSVAGINAL ULTRASOUND OF PELVIS DOPPLER ULTRASOUND OF OVARIES TECHNIQUE: Both transabdominal and transvaginal ultrasound examinations of the pelvis were performed. Transabdominal technique was performed for global imaging of the pelvis including uterus, ovaries, adnexal regions, and pelvic cul-de-sac. It was necessary to proceed with endovaginal exam following the transabdominal exam to visualize the endometrium and ovaries. Color and duplex Doppler ultrasound was utilized to evaluate blood flow to the ovaries. COMPARISON:  None. FINDINGS: Uterus Measurements: 8.0 x 3.9 x 3.9 cm. The uterus is anteverted and slightly heterogeneous likely related to underlying adenomyosis. Endometrium Thickness: 4 mm. There is a 7 mm echogenic area with posterior shadowing in the upper endometrium  which may represent an area of calcification. This is of indeterminate etiology but may be related to prior scarring or inflammatory process. Right ovary Measurements: 2.9 x 1.5 x 2.0 cm. Normal appearance/no adnexal mass. Left ovary Measurements: 2.5 x 1.9 x 2.1 cm. Normal appearance/no adnexal mass. Pulsed Doppler evaluation of both ovaries demonstrates normal low-resistance arterial and venous waveforms. Other findings No abnormal free fluid. IMPRESSION: 1. Heterogeneous uterus with findings suggestive of adenomyosis. 2. Focal area of calcification in the upper endometrium of indeterminate etiology, likely related to prior inflammation or scarring. Clinical correlation is recommended. 3. Unremarkable ovaries with bilateral Doppler detected flow. Electronically Signed   By: Anner Crete M.D.   On: 06/07/2018 02:10   US Pelvic Doppler (torsion R/o Or Mass Arterial Flow)  Result Date: 06/07/2018 CLINICAL DATA:  37 year old female with pelvic pain. EXAM: TRANSABDOMINAL AND TRANSVAGINAL ULTRASOUND OF PELVIS DOPPLER ULTRASOUND OF OVARIES TECHNIQUE: Both transabdominal and transvaginal ultrasound examinations of the pelvis were performed. Transabdominal technique was performed for  global imaging of the pelvis including uterus, ovaries, adnexal regions, and pelvic cul-de-sac. It was necessary to proceed with endovaginal exam following the transabdominal exam to visualize the endometrium and ovaries. Color and duplex Doppler ultrasound was utilized to evaluate blood flow to the ovaries. COMPARISON:  None. FINDINGS: Uterus Measurements: 8.0 x 3.9 x 3.9 cm. The uterus is anteverted and slightly heterogeneous likely related to underlying adenomyosis. Endometrium Thickness: 4 mm. There is a 7 mm echogenic area with posterior shadowing in the upper endometrium which may represent an area of calcification. This is of indeterminate etiology but may be related to prior scarring or inflammatory process. Right ovary  Measurements: 2.9 x 1.5 x 2.0 cm. Normal appearance/no adnexal mass. Left ovary Measurements: 2.5 x 1.9 x 2.1 cm. Normal appearance/no adnexal mass. Pulsed Doppler evaluation of both ovaries demonstrates normal low-resistance arterial and venous waveforms. Other findings No abnormal free fluid. IMPRESSION: 1. Heterogeneous uterus with findings suggestive of adenomyosis. 2. Focal area of calcification in the upper endometrium of indeterminate etiology, likely related to prior inflammation or scarring. Clinical correlation is recommended. 3. Unremarkable ovaries with bilateral Doppler detected flow. Electronically Signed   By: Anner Crete M.D.   On: 06/07/2018 02:10    ____________________________________________   PROCEDURES  Procedure(s) performed: None  Pelvic deferred given patient is not complaining of vaginal bleeding or discharge and has chronic right pelvic pain  Procedures  Critical Care performed: No  ____________________________________________   INITIAL IMPRESSION / ASSESSMENT AND PLAN / ED COURSE  As part of my medical decision making, I reviewed the following data within the [facility] notes reviewed and incorporated, Labs reviewed, Old chart reviewed, Radiograph reviewed  and Notes from prior ED visits   37 year old female who presents with acute on chronic right pelvic pain. Differential diagnosis includes, but is not limited to, ovarian cyst, ovarian torsion, acute appendicitis, diverticulitis, urinary tract infection/pyelonephritis, endometriosis, bowel obstruction, colitis, renal colic, gastroenteritis, hernia, fibroids, endometriosis, pregnancy related pain including ectopic pregnancy, etc.   Laboratory urinalysis results unremarkable.  Will proceed with pelvic ultrasound.  Will administer 50 mcg IV fentanyl and 0.5 mg IV Ativan.    Clinical Course as of Jun 07 234  Wed Jun 07, 2018  0230 Patient resting in NAD. Updated her of pelvic  US. Encouraged her to follow up with OB/GYN. Strict return precautions given. Patient verbalizes understanding and agrees with plan of care.    [JS]    Clinical Course User Index [JS] Paulette Blanch, MD     ____________________________________________   FINAL CLINICAL IMPRESSION(S) / ED DIAGNOSES  Final diagnoses:  Pain  Pelvic pain in female  Adenomyosis     ED Discharge Orders        Ordered    naproxen (NAPROSYN) 500 MG tablet  2 times daily with meals     06/07/18 0234       Note:  This document was prepared using Dragon voice recognition software and may include unintentional dictation errors.    Paulette Blanch, MD 06/07/18 [phone]

## 2018-06-12 ENCOUNTER — Emergency Department
Admission: EM | Admit: 2018-06-12 | Discharge: 2018-06-12 | Disposition: A | Discharge status: Home / Self Care | Payer: BLUE CROSS/BLUE SHIELD | Attending: Emergency Medicine | Admitting: Emergency Medicine

## 2018-06-12 ENCOUNTER — Other Ambulatory Visit: Payer: Self-pay

## 2018-06-12 ENCOUNTER — Encounter: Payer: Self-pay | Admitting: Emergency Medicine

## 2018-06-12 DIAGNOSIS — R102 Pelvic and perineal pain: Secondary | ICD-10-CM | POA: Diagnosis not present

## 2018-06-12 DIAGNOSIS — Z87891 Personal history of nicotine dependence: Secondary | ICD-10-CM | POA: Insufficient documentation

## 2018-06-12 HISTORY — DX: Endometriosis, unspecified: N80.9

## 2018-06-12 LAB — COMPREHENSIVE METABOLIC PANEL
ALK PHOS: 49 U/L (ref 38–126)
ALT: 31 U/L (ref 0–44)
AST: 25 U/L (ref 15–41)
Albumin: 3.9 g/dL (ref 3.5–5.0)
Anion gap: 7 (ref 5–15)
BILIRUBIN TOTAL: 0.4 mg/dL (ref 0.3–1.2)
BUN: 8 mg/dL (ref 6–20)
CALCIUM: 9.1 mg/dL (ref 8.9–10.3)
CO2: 27 mmol/L (ref 22–32)
CREATININE: 0.77 mg/dL (ref 0.44–1.00)
Chloride: 106 mmol/L (ref 98–111)
GFR calc Af Amer: >60 mL/min (ref >60)
Glucose, Bld: 102 mg/dL — ABNORMAL HIGH (ref 70–99)
Potassium: 3.6 mmol/L (ref 3.5–5.1)
Sodium: 140 mmol/L (ref 135–145)
TOTAL PROTEIN: 8.2 g/dL — AB (ref 6.5–8.1)

## 2018-06-12 LAB — CBC
HCT: 43.2 % (ref 35.0–47.0)
Hemoglobin: 14.8 g/dL (ref 12.0–16.0)
MCH: 31 pg (ref 26.0–34.0)
MCHC: 34.2 g/dL (ref 32.0–36.0)
MCV: 90.5 fL (ref 80.0–100.0)
PLATELETS: 362 10*3/uL (ref 150–440)
RBC: 4.77 MIL/uL (ref 3.80–5.20)
RDW: 13.2 % (ref 11.5–14.5)
WBC: 7.9 10*3/uL (ref 3.6–11.0)

## 2018-06-12 LAB — PREGNANCY, URINE: Preg Test, Ur: NEGATIVE

## 2018-06-12 LAB — URINALYSIS, COMPLETE (UACMP) WITH MICROSCOPIC
BILIRUBIN URINE: NEGATIVE
Bacteria, UA: NONE SEEN
Glucose, UA: NEGATIVE mg/dL
Hgb urine dipstick: NEGATIVE
Ketones, ur: NEGATIVE mg/dL
LEUKOCYTES UA: NEGATIVE
NITRITE: NEGATIVE
PH: 6 (ref 5.0–8.0)
Protein, ur: NEGATIVE mg/dL
SPECIFIC GRAVITY, URINE: 1.017 (ref 1.005–1.030)

## 2018-06-12 LAB — LIPASE, BLOOD: Lipase: 35 U/L (ref 11–51)

## 2018-06-12 MED ORDER — MORPHINE SULFATE (PF) 4 MG/ML IV SOLN
4.0000 mg | Freq: Once | INTRAVENOUS | Status: AC
  Administered 2018-06-12: 4 mg via INTRAVENOUS

## 2018-06-12 MED ORDER — ACETAMINOPHEN 500 MG PO TABS
ORAL_TABLET | ORAL | Status: AC
  Administered 2018-06-12: 1000 mg via ORAL
  Filled 2018-06-12: qty 2

## 2018-06-12 MED ORDER — ONDANSETRON HCL 4 MG/2ML IJ SOLN
4.0000 mg | Freq: Once | INTRAMUSCULAR | Status: AC
  Administered 2018-06-12: 4 mg via INTRAVENOUS

## 2018-06-12 MED ORDER — MORPHINE SULFATE (PF) 4 MG/ML IV SOLN
INTRAVENOUS | Status: AC
  Administered 2018-06-12: 4 mg via INTRAVENOUS
  Filled 2018-06-12: qty 1

## 2018-06-12 MED ORDER — OXYCODONE-ACETAMINOPHEN 5-325 MG PO TABS: 1.0000 | ORAL_TABLET | ORAL | 0 refills | Status: DC | PRN

## 2018-06-12 MED ORDER — ONDANSETRON HCL 4 MG/2ML IJ SOLN
INTRAMUSCULAR | Status: AC
  Administered 2018-06-12: 4 mg via INTRAVENOUS
  Filled 2018-06-12: qty 2

## 2018-06-12 MED ORDER — ACETAMINOPHEN 500 MG PO TABS
1000.0000 mg | ORAL_TABLET | Freq: Once | ORAL | Status: AC
  Administered 2018-06-12: 1000 mg via ORAL

## 2018-06-12 NOTE — ED Notes (Signed)
Lab notified that urine sent without POC done. Order changed, per EDP and lab, so that they can perform urine pregnancy.

## 2018-06-12 NOTE — ED Notes (Signed)
ED Provider at bedside. 

## 2018-06-12 NOTE — ED Notes (Signed)
This RN attempted x2 to obtain IV access and blood work. One light green tube sent down.  Marya Amsler, RN at bedside now to attempt access.

## 2018-06-12 NOTE — Discharge Instructions (Signed)
Do not put anything in the vagina at all until the pain is gone.  Please follow-up with [facility] OB/[facility].  Asked them to consider hysterectomy.

## 2018-06-12 NOTE — ED Provider Notes (Addendum)
[facility] Emergency Department Provider Note   ____________________________________________   First MD Initiated Contact with Patient 06/12/18 0915     (approximate)  I have reviewed the triage vital signs and the nursing notes.   HISTORY  Chief Complaint Abdominal Pain    HPI Sherry Watkins is a 37 y.o. female who reports she has had pelvic pain worse with intercourse for quite some time.  She had asked for hysterectomy in 2015 but was not done.  She was seen on the 16th of this month for the same thing pain has not really gotten any better.  Ultrasound done at that time showed adenomyosis.  Patient denies any discharge is not worried about having an STD.  Pain is quite severe at present.  Achy in nature.  She has no fever no nausea or vomiting at present.   Past Medical History:  Diagnosis Date  . Complication of anesthesia   . Depression   . Endometriosis   . GERD (gastroesophageal reflux disease)    NO MEDS  . Headache    FREQUENTLY  . Ovarian cyst     Patient Active Problem List   Diagnosis Date Noted  . Chronic low back pain (Right) without sciatica 02/09/2018  . Elevated sed rate 02/01/2018  . Vitamin D deficiency 02/01/2018  . Chronic sacroiliac joint pain (Right) 02/01/2018  . Other specified dorsopathies, sacral and sacrococcygeal region 02/01/2018  . Chronic abdominal pain (RLQ) (Primary Area of Pain) (Right) 12/26/2017  . Chronic lower extremity pain (Secondary Area of Pain) (Right) 12/26/2017  . Chronic low back pain with right-sided sciatica [facility] Area of Pain) (Bilateral) (R>L) 12/26/2017  . Chronic pain syndrome 12/26/2017  . Opiate use 12/26/2017  . Problems influencing health status 12/26/2017  . Pharmacologic therapy 12/26/2017  . Disorder of skeletal system 12/26/2017    Past Surgical History:  Procedure Laterality Date  . ABLATION    . CESAREAN SECTION    . HYSTEROSCOPY WITH NOVASURE N/A 05/27/2015   Procedure: HYSTEROSCOPY WITH NOVASURE;  Surgeon: Aletha Halim, MD;  Location: [facility] ORS;  Service: Gynecology;  Laterality: N/A;  . LAPAROSCOPY N/A 07/07/2017   Procedure: LAPAROSCOPY DIAGNOSTIC;  Surgeon: Malachy Mood, MD;  Location: [facility] ORS;  Service: Gynecology;  Laterality: N/A;  . TUBAL LIGATION      Prior to Admission medications   Medication Sig Start Date End Date Taking? Authorizing Provider  acetaminophen (TYLENOL) 500 MG tablet Take 1,000 mg by mouth every 6 (six) hours as needed.   Yes [provider]  Calcium Carbonate-Vit D-Min (GNP CALCIUM 1200) 1200-1000 MG-UNIT CHEW Chew 1,200 mg by mouth daily with breakfast. Take in combination with vitamin D and magnesium. 02/01/18 07/31/18 Yes Milinda Pointer, MD  Cholecalciferol (VITAMIN D3) 5000 units CAPS Take 1 capsule (5,000 Units total) by mouth daily with breakfast. Take along with calcium and magnesium. 02/01/18 07/31/18 Yes Milinda Pointer, MD  ibuprofen (ADVIL,MOTRIN) 600 MG tablet Take 1 tablet (600 mg total) by mouth every 8 (eight) hours as needed. 05/08/18  Yes Johnn Hai, PA-C  Magnesium 500 MG CAPS Take 1 capsule (500 mg total) by mouth 2 (two) times daily at 8 am and 10 pm. 02/01/18 07/31/18 Yes Milinda Pointer, MD  naproxen (NAPROSYN) 500 MG tablet Take 1 tablet (500 mg total) by mouth 2 (two) times daily with a meal. 06/07/18  Yes Paulette Blanch, MD  traMADol (ULTRAM) 50 MG tablet Take 50 mg by mouth every 6 (six) hours as needed for  pain. 06/08/18  Yes [provider]  oxyCODONE-acetaminophen (PERCOCET/ROXICET) 5-325 MG tablet Take 1 tablet by mouth every 4 (four) hours as needed for severe pain. Patient not taking: Reported on 06/12/2018 01/27/18   Darel Hong, MD  oxyCODONE-acetaminophen (PERCOCET/ROXICET) 5-325 MG tablet Take 1 tablet by mouth every 4 (four) hours as needed for up to 4 doses for severe pain. 06/12/18   Nena Polio, MD  traMADol (ULTRAM) 50 MG tablet Take 1 tablet (50 mg  total) by mouth every 6 (six) hours as needed for severe pain. 02/01/18 03/03/18  Milinda Pointer, MD    Allergies Patient has no known allergies.  Family History  Problem Relation Age of Onset  . Diabetes Mother   . Hypertension Mother   . Heart disease Father   . Cancer Father   . Heart disease Brother     Social History Social History   Tobacco Use  . Smoking status: Former Smoker    Years: 15.00    Types: Cigarettes    Last attempt to quit: 06/30/2004    Years since quitting: 13.9  . Smokeless tobacco: Never Used  Substance Use Topics  . Alcohol use: Yes    Comment: OCC  . Drug use: No    Review of Systems  Constitutional: No fever/chills Eyes: No visual changes. ENT: No sore throat. Cardiovascular: Denies chest pain. Respiratory: Denies shortness of breath. Gastrointestinal:  abdominal pain.  No nausea, no vomiting.  No diarrhea.  No constipation. Genitourinary: Negative for dysuria. Musculoskeletal: Negative for back pain. Skin: Negative for rash. Neurological: Negative for headaches, focal weakness   ____________________________________________   PHYSICAL EXAM:  VITAL SIGNS: ED Triage Vitals  Enc Vitals Group     BP 06/12/18 1034 (!) 190/103     Pulse Rate 06/12/18 0838 72     Resp 06/12/18 0838 16     Temp 06/12/18 0838 97.7 F (36.5 C)     Temp Source 06/12/18 0838 Oral     SpO2 06/12/18 0838 100 %     Weight 06/12/18 0838 228 lb (103.4 kg)     Height 06/12/18 0838 5\' 2"  (1.575 m)     Head Circumference --      Peak Flow --      Pain Score 06/12/18 0838 10     Pain Loc --      Pain Edu? --      Excl. in [place]? --     Constitutional: Alert and oriented. Well appearing and in no acute distress. Eyes: Conjunctivae are normal.  Head: Atraumatic. Nose: No congestion/rhinnorhea. Mouth/Throat: Mucous membranes are moist.  Oropharynx non-erythematous. Neck: No stridor. Cardiovascular: Normal rate, regular rhythm. Grossly normal heart sounds.   Good peripheral circulation. Respiratory: Normal respiratory effort.  No retractions. Lungs CTAB. Gastrointestinal: Soft and nontender except for just to the right of the suprapubic area.  This is her chronic pain.. No distention. No abdominal bruits. No CVA tenderness. Genitourinary: Deferred due to chronic pain Musculoskeletal: No lower extremity tenderness nor edema.  No joint effusions. Neurologic:  Normal speech and language. No gross focal neurologic deficits are appreciated. No gait instability. Skin:  Skin is warm, dry and intact. No rash noted. Psychiatric: Mood and affect are normal. Speech and behavior are normal.  ____________________________________________   LABS (all labs ordered are listed, but only abnormal results are displayed)  Labs Reviewed  COMPREHENSIVE METABOLIC PANEL - Abnormal; Notable for the following components:      Result Value   Glucose, Bld  102 (*)    Total Protein 8.2 (*)    All other components within normal limits  URINALYSIS, COMPLETE (UACMP) WITH MICROSCOPIC - Abnormal; Notable for the following components:   Color, Urine YELLOW (*)    APPearance CLEAR (*)    All other components within normal limits  LIPASE, BLOOD  CBC  PREGNANCY, URINE   ____________________________________________  EKG  ________________________________________  RADIOLOGY  ED MD interpretation:   Official radiology report(s): No results found.  ____________________________________________   PROCEDURES  Procedure(s) performed:   Procedures  Critical Care performed:  ____________________________________________   INITIAL IMPRESSION / ASSESSMENT AND PLAN / ED COURSE  I will have the patient follow-up with [facility].  Perhaps they can do a hysterectomy.  I will give her 2 or 3 doses of Percocet and have her not engage in intercourse until the pain is better.    Discussed with Dr. Kenton Kingfisher on-call for [facility].  He agrees to have her come in for  follow-up. ____________________________________________   FINAL CLINICAL IMPRESSION(S) / ED DIAGNOSES  Final diagnoses:  Pelvic pain in female     ED Discharge Orders        Ordered    oxyCODONE-acetaminophen (PERCOCET/ROXICET) 5-325 MG tablet  Every 4 hours PRN     06/12/18 1100       Note:  This document was prepared using Dragon voice recognition software and may include unintentional dictation errors.    Nena Polio, MD 06/12/18 1101    Nena Polio, MD 06/12/18 [phone]

## 2018-06-12 NOTE — ED Notes (Signed)
Patient to Room 2, Paulette RN aware that patient has not had protocols done.

## 2018-06-12 NOTE — ED Triage Notes (Signed)
Says seen on 7/16 for same.  Her abdominal pain has not gotten any better.

## 2018-06-15 ENCOUNTER — Telehealth: Payer: Self-pay | Admitting: Pain Medicine

## 2018-06-15 NOTE — Telephone Encounter (Signed)
Attempted to call patient, left voicemail informing her that we have not received FMLA forms, she may want to call and have them re sent.

## 2018-06-15 NOTE — Telephone Encounter (Signed)
Patient called asking about her FMLA papers for work? Please let her know status of these papers.

## 2018-07-07 ENCOUNTER — Telehealth: Payer: Self-pay

## 2018-07-07 NOTE — Telephone Encounter (Signed)
Patient called stating she is going to the ED because she is in pain and needs [place] to call her pharmacy and tell them that it is OK to fill her prescriptions.  Informed patient that I could not do that.  Explained that she has not even gotten a script yet and I would need to know what the script was for.  Patient is under pain contract here and has not been seen since  March but has gotten several scripts from other physicians since March.

## 2018-07-12 ENCOUNTER — Emergency Department
Admission: EM | Admit: 2018-07-12 | Discharge: 2018-07-12 | Discharge status: Against Medical Advice | Payer: BLUE CROSS/BLUE SHIELD | Attending: Emergency Medicine | Admitting: Emergency Medicine

## 2018-07-12 ENCOUNTER — Other Ambulatory Visit: Payer: Self-pay

## 2018-07-12 DIAGNOSIS — Z5321 Procedure and treatment not carried out due to patient leaving prior to being seen by health care provider: Secondary | ICD-10-CM | POA: Diagnosis not present

## 2018-07-12 DIAGNOSIS — Z008 Encounter for other general examination: Secondary | ICD-10-CM | POA: Insufficient documentation

## 2018-07-12 DIAGNOSIS — Z79899 Other long term (current) drug therapy: Secondary | ICD-10-CM | POA: Insufficient documentation

## 2018-07-12 DIAGNOSIS — Z01419 Encounter for gynecological examination (general) (routine) without abnormal findings: Secondary | ICD-10-CM

## 2018-07-12 DIAGNOSIS — Z87891 Personal history of nicotine dependence: Secondary | ICD-10-CM | POA: Diagnosis not present

## 2018-07-12 NOTE — ED Notes (Signed)
FIRST NURSE NOTE:  Pt needs form completed to be able to donate plasma.  Pt has form with her.

## 2018-07-12 NOTE — ED Triage Notes (Signed)
Pt brought form with her that she wants filled out to state that she is safe to donate plasma.

## 2018-07-12 NOTE — ED Provider Notes (Signed)
[facility] Emergency Department Provider Note  ____________________________________________  Time seen: Approximately 5:47 PM  I have reviewed the triage vital signs and the nursing notes.   HISTORY  Chief Complaint Doctor's Note    HPI Sherry Watkins is a 37 y.o. female who presents the emergency department requesting that we perform a physical and clear her for donation of plasma.  Patient reports that she is trying to "make some money" and with her medical history she was required to have medical clearance for donation.  During explanation to the patient that we do not perform medical clearance for things such as plasma donation, patient states "well you have to fill out my form because you diagnosed me with endometriosis."  I explained to the patient that endometriosis is a diagnosis, but it does not require this hospital to clear her just because we gave her the diagnosis.  I explained that she needs to take her form to her primary care and let them clear her.  Patient became upset, left prior to finishing ROS and before detailed physical exam.    Past Medical History:  Diagnosis Date  . Complication of anesthesia   . Depression   . Endometriosis   . GERD (gastroesophageal reflux disease)    NO MEDS  . Headache    FREQUENTLY  . Ovarian cyst     Patient Active Problem List   Diagnosis Date Noted  . Chronic low back pain (Right) without sciatica 02/09/2018  . Elevated sed rate 02/01/2018  . Vitamin D deficiency 02/01/2018  . Chronic sacroiliac joint pain (Right) 02/01/2018  . Other specified dorsopathies, sacral and sacrococcygeal region 02/01/2018  . Chronic abdominal pain (RLQ) (Primary Area of Pain) (Right) 12/26/2017  . Chronic lower extremity pain (Secondary Area of Pain) (Right) 12/26/2017  . Chronic low back pain with right-sided sciatica [facility] Area of Pain) (Bilateral) (R>L) 12/26/2017  . Chronic pain syndrome 12/26/2017  . Opiate use  12/26/2017  . Problems influencing health status 12/26/2017  . Pharmacologic therapy 12/26/2017  . Disorder of skeletal system 12/26/2017    Past Surgical History:  Procedure Laterality Date  . ABLATION    . CESAREAN SECTION    . HYSTEROSCOPY WITH NOVASURE N/A 05/27/2015   Procedure: HYSTEROSCOPY WITH NOVASURE;  Surgeon: Aletha Halim, MD;  Location: [facility] ORS;  Service: Gynecology;  Laterality: N/A;  . LAPAROSCOPY N/A 07/07/2017   Procedure: LAPAROSCOPY DIAGNOSTIC;  Surgeon: Malachy Mood, MD;  Location: [facility] ORS;  Service: Gynecology;  Laterality: N/A;  . TUBAL LIGATION      Prior to Admission medications   Medication Sig Start Date End Date Taking? Authorizing Provider  acetaminophen (TYLENOL) 500 MG tablet Take 1,000 mg by mouth every 6 (six) hours as needed.    [provider]  Calcium Carbonate-Vit D-Min (GNP CALCIUM 1200) 1200-1000 MG-UNIT CHEW Chew 1,200 mg by mouth daily with breakfast. Take in combination with vitamin D and magnesium. 02/01/18 07/31/18  Milinda Pointer, MD  Cholecalciferol (VITAMIN D3) 5000 units CAPS Take 1 capsule (5,000 Units total) by mouth daily with breakfast. Take along with calcium and magnesium. 02/01/18 07/31/18  Milinda Pointer, MD  ibuprofen (ADVIL,MOTRIN) 600 MG tablet Take 1 tablet (600 mg total) by mouth every 8 (eight) hours as needed. 05/08/18   Johnn Hai, PA-C  Magnesium 500 MG CAPS Take 1 capsule (500 mg total) by mouth 2 (two) times daily at 8 am and 10 pm. 02/01/18 07/31/18  Milinda Pointer, MD  naproxen (NAPROSYN) 500 MG  tablet Take 1 tablet (500 mg total) by mouth 2 (two) times daily with a meal. 06/07/18   Paulette Blanch, MD  oxyCODONE-acetaminophen (PERCOCET/ROXICET) 5-325 MG tablet Take 1 tablet by mouth every 4 (four) hours as needed for severe pain. Patient not taking: Reported on 06/12/2018 01/27/18   Darel Hong, MD  oxyCODONE-acetaminophen (PERCOCET/ROXICET) 5-325 MG tablet Take 1 tablet by mouth every 4 (four) hours  as needed for up to 4 doses for severe pain. 06/12/18   Nena Polio, MD  traMADol (ULTRAM) 50 MG tablet Take 1 tablet (50 mg total) by mouth every 6 (six) hours as needed for severe pain. 02/01/18 03/03/18  Milinda Pointer, MD  traMADol (ULTRAM) 50 MG tablet Take 50 mg by mouth every 6 (six) hours as needed for pain. 06/08/18   [provider]    Allergies Patient has no known allergies.  Family History  Problem Relation Age of Onset  . Diabetes Mother   . Hypertension Mother   . Heart disease Father   . Cancer Father   . Heart disease Brother     Social History Social History   Tobacco Use  . Smoking status: Former Smoker    Years: 15.00    Types: Cigarettes    Last attempt to quit: 06/30/2004    Years since quitting: 14.0  . Smokeless tobacco: Never Used  Substance Use Topics  . Alcohol use: Yes    Comment: OCC  . Drug use: No     Review of Systems  Constitutional: Denied any complaints.  Patient became upset and left before review of systems and physical exam was performed.  10-point ROS otherwise negative.  ____________________________________________   PHYSICAL EXAM:  VITAL SIGNS: ED Triage Vitals  Enc Vitals Group     BP 07/12/18 1739 (!) 168/96     Pulse Rate 07/12/18 1739 68     Resp 07/12/18 1739 18     Temp 07/12/18 1739 98.2 F (36.8 C)     Temp Source 07/12/18 1739 Oral     SpO2 07/12/18 1739 100 %     Weight 07/12/18 1740 227 lb 1.2 oz (103 kg)     Height 07/12/18 1740 5\' 2"  (1.575 m)     Head Circumference --      Peak Flow --      Pain Score 07/12/18 1740 0     Pain Loc --      Pain Edu? --      Excl. in [place]? --     No detailed physical exam is performed, findings are consistent with what is visualized during patient interview.   Constitutional: Alert and oriented. Well appearing and in no acute distress. Eyes: Conjunctivae are normal. PERRL. EOMI. Head: Atraumatic. Neck: No stridor.    Respiratory: Normal respiratory  effort without tachypnea or retractions.  Musculoskeletal: Full range of motion to all extremities. No gross deformities appreciated. Neurologic:  Normal speech and language. No gross focal neurologic deficits are appreciated.  Skin:  Skin is warm, dry and intact. No rash noted. Psychiatric: Mood and affect are normal. Speech and behavior are normal. Patient exhibits appropriate insight and judgement.   ____________________________________________   LABS (all labs ordered are listed, but only abnormal results are displayed)  Labs Reviewed - No data to display ____________________________________________  EKG   ____________________________________________  RADIOLOGY   No results found.  ____________________________________________    PROCEDURES  Procedure(s) performed:    Procedures    Medications - No data to  display   ____________________________________________   INITIAL IMPRESSION / ASSESSMENT AND PLAN / ED COURSE  Pertinent labs & imaging results that were available during my care of the patient were reviewed by me and considered in my medical decision making (see chart for details).  Review of the [place] CSRS was performed in accordance of the [place] prior to dispensing any controlled drugs.      Presented to the emergency department requiring a medical clearance for plasma donation.  I informed patient that we do not perform medical clearance for plasma donation.  Patient became very upset, states that since we had diagnosed her with endometriosis we should be responsible for clearing her.  I informed patient that endometriosis is a diagnosis but its not a diagnosis requiring emergence medicine to clear her, she would still need to present to her primary care for medical clearance.  Patient became very upset, would not finish interview or permit detailed physical exam.  She left prior to completion of interview and physical  exam.   ____________________________________________  FINAL CLINICAL IMPRESSION(S) / ED DIAGNOSES  Final diagnoses:  Encounter for well woman exam      NEW MEDICATIONS STARTED DURING THIS VISIT:  ED Discharge Orders    None          This chart was dictated using voice recognition software/Dragon. Despite best efforts to proofread, errors can occur which can change the meaning. Any change was purely unintentional.    Darletta Moll, PA-C 07/12/18 Margaretmary Dys, [place], MD 07/15/18 [phone]

## 2018-07-12 NOTE — ED Notes (Signed)
See triage note  Provider in room with pt and family.  Informed pt that he was not able to fill out form  Pt stormed out with family

## 2018-07-18 ENCOUNTER — Ambulatory Visit: Payer: BLUE CROSS/BLUE SHIELD | Admitting: Nurse Practitioner

## 2018-07-19 ENCOUNTER — Ambulatory Visit: Payer: BLUE CROSS/BLUE SHIELD | Admitting: Nurse Practitioner

## 2018-07-31 ENCOUNTER — Encounter: Payer: Self-pay | Admitting: Emergency Medicine

## 2018-07-31 ENCOUNTER — Other Ambulatory Visit: Payer: Self-pay

## 2018-07-31 ENCOUNTER — Emergency Department
Admission: EM | Admit: 2018-07-31 | Discharge: 2018-07-31 | Disposition: A | Discharge status: Home / Self Care | Payer: BLUE CROSS/BLUE SHIELD | Attending: Emergency Medicine | Admitting: Emergency Medicine

## 2018-07-31 DIAGNOSIS — R109 Unspecified abdominal pain: Secondary | ICD-10-CM | POA: Insufficient documentation

## 2018-07-31 DIAGNOSIS — Z87891 Personal history of nicotine dependence: Secondary | ICD-10-CM | POA: Insufficient documentation

## 2018-07-31 DIAGNOSIS — G8929 Other chronic pain: Secondary | ICD-10-CM | POA: Diagnosis not present

## 2018-07-31 DIAGNOSIS — R1033 Periumbilical pain: Secondary | ICD-10-CM | POA: Diagnosis not present

## 2018-07-31 DIAGNOSIS — R1031 Right lower quadrant pain: Secondary | ICD-10-CM | POA: Insufficient documentation

## 2018-07-31 DIAGNOSIS — Z79899 Other long term (current) drug therapy: Secondary | ICD-10-CM | POA: Diagnosis not present

## 2018-07-31 LAB — URINALYSIS, COMPLETE (UACMP) WITH MICROSCOPIC
BACTERIA UA: NONE SEEN
Bilirubin Urine: NEGATIVE
Glucose, UA: NEGATIVE mg/dL
Hgb urine dipstick: NEGATIVE
Ketones, ur: NEGATIVE mg/dL
Leukocytes, UA: NEGATIVE
Nitrite: NEGATIVE
PH: 7 (ref 5.0–8.0)
Protein, ur: NEGATIVE mg/dL
SPECIFIC GRAVITY, URINE: 1.013 (ref 1.005–1.030)

## 2018-07-31 LAB — COMPREHENSIVE METABOLIC PANEL
ALT: 37 U/L (ref 0–44)
AST: 39 U/L (ref 15–41)
Albumin: 4 g/dL (ref 3.5–5.0)
Alkaline Phosphatase: 47 U/L (ref 38–126)
Anion gap: 8 (ref 5–15)
BUN: 10 mg/dL (ref 6–20)
CHLORIDE: 106 mmol/L (ref 98–111)
CO2: 25 mmol/L (ref 22–32)
CREATININE: 0.76 mg/dL (ref 0.44–1.00)
Calcium: 9.2 mg/dL (ref 8.9–10.3)
GFR calc Af Amer: >60 mL/min (ref >60)
Glucose, Bld: 104 mg/dL — ABNORMAL HIGH (ref 70–99)
Potassium: 3.4 mmol/L — ABNORMAL LOW (ref 3.5–5.1)
Sodium: 139 mmol/L (ref 135–145)
Total Bilirubin: 0.6 mg/dL (ref 0.3–1.2)
Total Protein: 7.9 g/dL (ref 6.5–8.1)

## 2018-07-31 LAB — CBC
HCT: 40.8 % (ref 35.0–47.0)
Hemoglobin: 13.9 g/dL (ref 12.0–16.0)
MCH: 31.1 pg (ref 26.0–34.0)
MCHC: 34 g/dL (ref 32.0–36.0)
MCV: 91.3 fL (ref 80.0–100.0)
PLATELETS: 306 10*3/uL (ref 150–440)
RBC: 4.47 MIL/uL (ref 3.80–5.20)
RDW: 14 % (ref 11.5–14.5)
WBC: 8.2 10*3/uL (ref 3.6–11.0)

## 2018-07-31 LAB — LIPASE, BLOOD: Lipase: 28 U/L (ref 11–51)

## 2018-07-31 MED ORDER — KETOROLAC TROMETHAMINE 60 MG/2ML IM SOLN
15.0000 mg | Freq: Once | INTRAMUSCULAR | Status: AC
  Administered 2018-07-31: 15 mg via INTRAMUSCULAR
  Filled 2018-07-31: qty 2

## 2018-07-31 MED ORDER — DICYCLOMINE HCL 10 MG/ML IM SOLN
20.0000 mg | Freq: Once | INTRAMUSCULAR | Status: AC
  Administered 2018-07-31: 20 mg via INTRAMUSCULAR
  Filled 2018-07-31: qty 2

## 2018-07-31 NOTE — ED Provider Notes (Signed)
[facility] Emergency Department Provider Note  ____________________________________________  Time seen: Approximately 9:31 AM  I have reviewed the triage vital signs and the nursing notes.   HISTORY  Chief Complaint Abdominal Pain    HPI Sherry Watkins is a 37 y.o. female who complains of periumbilical and right lower quadrant abdominal pain that started today.  Feels like her endometriosis pain that she has had for many years.  Does tend to get worse on a monthly basis.   No atypical features, nonradiating, no aggravating or alleviating factors.  Tolerating oral intake, no vomiting or diarrhea or constipation.  No fevers or chills.  No trauma.  No abnormal vaginal bleeding or discharge.   Review of electronic medical record shows that she is presented to other emergency departments with identical symptoms, reporting them to have been going on for years.  In March 2019 she was at the [facility] ED, eloped after receiving Valium.  Review of controlled substance reporting system shows that she has had 4 opioid prescriptions filled in July of this year and has been under the care of pain management.  06/20/2018  1  02/01/2018  Tramadol Hcl 50 Mg Tablet  8.00 2 Fr Nav  [id number]  [place] ([id number])  5/1 20.00 MME Comm Ins  [place]  06/12/2018  1  06/12/2018  Oxycodone-Acetaminophen 5-325  4.00 1 Pa Mal  [id number]  [place] ([id number])  1/1 30.00 MME Comm Ins  [place]  06/08/2018  1  02/01/2018  Tramadol Hcl 50 Mg Tablet  28.00 7 Fr Nav  [id number]  [place] ([id number])  4/1 20.00 MME Comm Ins  [place]  05/22/2018  1  02/01/2018  Tramadol Hcl 50 Mg Tablet  28.00 7 Fr Nav  [id number]  [place] ([id number])  3/1 20.00 MME Comm Ins  [place]  04/22/2018  1  02/01/2018  Tramadol Hcl 50 Mg Tablet  28.00 7 Fr Nav  [id number]  [place] ([id number])  2/1 20.00 MME Comm Ins  [place]  02/03/2018  1  02/01/2018  Tramadol Hcl 50 Mg Tablet  28.00 7 Fr Nav  [id number]  [place] ([id number])  1/1 20.00 MME Comm Ins  [place]  01/27/2018  1  01/27/2018  Oxycodone-Acetaminophen 5-325  15.00 4  Ne Rif  [id number]  [place] ([id number])  1/1 28.13 MME Private Pay  [place]      Past Medical History:  Diagnosis Date  . Complication of anesthesia   . Depression   . Endometriosis   . GERD (gastroesophageal reflux disease)    NO MEDS  . Headache    FREQUENTLY  . Ovarian cyst      Patient Active Problem List   Diagnosis Date Noted  . Chronic low back pain (Right) without sciatica 02/09/2018  . Elevated sed rate 02/01/2018  . Vitamin D deficiency 02/01/2018  . Chronic sacroiliac joint pain (Right) 02/01/2018  . Other specified dorsopathies, sacral and sacrococcygeal region 02/01/2018  . Chronic abdominal pain (RLQ) (Primary Area of Pain) (Right) 12/26/2017  . Chronic lower extremity pain (Secondary Area of Pain) (Right) 12/26/2017  . Chronic low back pain with right-sided sciatica [facility] Area of Pain) (Bilateral) (R>L) 12/26/2017  . Chronic pain syndrome 12/26/2017  . Opiate use 12/26/2017  . Problems influencing health status 12/26/2017  . Pharmacologic therapy 12/26/2017  . Disorder of skeletal system 12/26/2017     Past Surgical History:  Procedure Laterality Date  . ABLATION    . CESAREAN SECTION    . HYSTEROSCOPY WITH NOVASURE N/A 05/27/2015   Procedure:  HYSTEROSCOPY WITH NOVASURE;  Surgeon: Aletha Halim, MD;  Location: [facility] ORS;  Service: Gynecology;  Laterality: N/A;  . LAPAROSCOPY N/A 07/07/2017   Procedure: LAPAROSCOPY DIAGNOSTIC;  Surgeon: Malachy Mood, MD;  Location: [facility] ORS;  Service: Gynecology;  Laterality: N/A;  . TUBAL LIGATION       Prior to Admission medications   Medication Sig Start Date End Date Taking? Authorizing Provider  acetaminophen (TYLENOL) 500 MG tablet Take 1,000 mg by mouth every 6 (six) hours as needed for mild pain.    Yes [provider]  Calcium Carbonate-Vit D-Min (GNP CALCIUM 1200) 1200-1000 MG-UNIT CHEW Chew 1,200 mg by mouth daily with breakfast. Take in combination with vitamin D and magnesium. 02/01/18 07/31/18 Yes Milinda Pointer, MD  Cholecalciferol (VITAMIN D3) 5000 units CAPS Take 1 capsule (5,000 Units total) by mouth daily with breakfast. Take along with calcium and magnesium. 02/01/18 07/31/18 Yes Milinda Pointer, MD  Magnesium 500 MG CAPS Take 1 capsule (500 mg total) by mouth 2 (two) times daily at 8 am and 10 pm. 02/01/18 07/31/18 Yes Milinda Pointer, MD  ibuprofen (ADVIL,MOTRIN) 600 MG tablet Take 1 tablet (600 mg total) by mouth every 8 (eight) hours as needed. Patient not taking: Reported on 07/31/2018 05/08/18   Johnn Hai, PA-C  naproxen (NAPROSYN) 500 MG tablet Take 1 tablet (500 mg total) by mouth 2 (two) times daily with a meal. Patient not taking: Reported on 07/31/2018 06/07/18   Paulette Blanch, MD  oxyCODONE-acetaminophen (PERCOCET/ROXICET) 5-325 MG tablet Take 1 tablet by mouth every 4 (four) hours as needed for severe pain. Patient not taking: Reported on 06/12/2018 01/27/18   Darel Hong, MD  oxyCODONE-acetaminophen (PERCOCET/ROXICET) 5-325 MG tablet Take 1 tablet by mouth every 4 (four) hours as needed for up to 4 doses for severe pain. Patient not taking: Reported on 07/31/2018 06/12/18   Nena Polio, MD  traMADol (ULTRAM) 50 MG tablet Take 1 tablet (50 mg total) by mouth every 6 (six) hours as needed for severe pain. 02/01/18 03/03/18  Milinda Pointer, MD  traMADol (ULTRAM) 50 MG tablet Take 50 mg by mouth every 6 (six) hours as needed for pain. 06/08/18   [provider]     Allergies Patient has no known allergies.   Family History  Problem Relation Age of Onset  . Diabetes Mother   . Hypertension Mother   . Heart disease Father   . Cancer Father   . Heart disease Brother     Social History Social History   Tobacco Use  . Smoking status: Former Smoker    Years: 15.00    Types: Cigarettes    Last attempt to quit: 06/30/2004    Years since quitting: 14.0  . Smokeless tobacco: Never Used  Substance Use Topics  . Alcohol use: Yes    Comment: OCC  . Drug use:  No    Review of Systems  Constitutional:   No fever or chills.  ENT:   No sore throat. No rhinorrhea. Cardiovascular:   No chest pain or syncope. Respiratory:   No dyspnea or cough. Gastrointestinal:   Positive as above for abdominal pain.  No vomiting or diarrhea..  Musculoskeletal:   Negative for focal pain or swelling All other systems reviewed and are negative except as documented above in ROS and HPI.  ____________________________________________   PHYSICAL EXAM:  VITAL SIGNS: ED Triage Vitals  Enc Vitals Group     BP 07/31/18 0819 (!) 158/99     Pulse  Rate 07/31/18 0819 96     Resp 07/31/18 0819 20     Temp 07/31/18 0819 98.5 F (36.9 C)     Temp Source 07/31/18 0819 Oral     SpO2 07/31/18 0819 98 %     Weight 07/31/18 0820 218 lb (98.9 kg)     Height 07/31/18 0820 5\' 2"  (1.575 m)     Head Circumference --      Peak Flow --      Pain Score 07/31/18 0819 10     Pain Loc --      Pain Edu? --      Excl. in [place]? --     Vital signs reviewed, nursing assessments reviewed.   Constitutional:   Alert and oriented. Non-toxic appearance. Eyes:   Conjunctivae are normal. EOMI. PERRL. ENT      Head:   Normocephalic and atraumatic.      Nose:   No congestion/rhinnorhea.       Mouth/Throat:   MMM, no pharyngeal erythema. No peritonsillar mass.       Neck:   No meningismus. Full ROM. Hematological/Lymphatic/Immunilogical:   No cervical lymphadenopathy. Cardiovascular:   RRR. Symmetric bilateral radial and DP pulses.  No murmurs. Cap refill less than 2 seconds. Respiratory:   Normal respiratory effort without tachypnea/retractions. Breath sounds are clear and equal bilaterally. No wheezes/rales/rhonchi. Gastrointestinal:   Soft with diffuse lower abdominal tenderness.. Non distended. There is no CVA tenderness.  No rebound, rigidity, or guarding.  No hernia  Musculoskeletal:   Normal range of motion in all extremities. No joint effusions.  No lower extremity tenderness.  No  edema. Neurologic:   Normal speech and language.  Motor grossly intact. No acute focal neurologic deficits are appreciated.  Skin:    Skin is warm, dry and intact. No rash noted.  No petechiae, purpura, or bullae.  ____________________________________________    LABS (pertinent positives/negatives) (all labs ordered are listed, but only abnormal results are displayed) Labs Reviewed  COMPREHENSIVE METABOLIC PANEL - Abnormal; Notable for the following components:      Result Value   Potassium 3.4 (*)    Glucose, Bld 104 (*)    All other components within normal limits  URINALYSIS, COMPLETE (UACMP) WITH MICROSCOPIC - Abnormal; Notable for the following components:   Color, Urine YELLOW (*)    APPearance CLEAR (*)    All other components within normal limits  LIPASE, BLOOD  CBC  POC URINE PREG, ED   ____________________________________________   EKG    ____________________________________________    RADIOLOGY  No results found.  ____________________________________________   PROCEDURES Procedures  ____________________________________________    CLINICAL IMPRESSION / ASSESSMENT AND PLAN / ED COURSE  Pertinent labs & imaging results that were available during my care of the patient were reviewed by me and considered in my medical decision making (see chart for details).    Patient presents with recurrence of her chronic abdominal pain matching her usual pain syndrome.  No atypical features.  Vital signs are unremarkable.  She is tolerating oral intake.  She is under the care of pain management and has been evaluated for the symptoms by gynecology in the past.  She does plan to follow-up with pain management and gynecology over the next few weeks.  Work-up today is unremarkable and physical exam does not reveal any new issues either.  Stable for discharge home and outpatient follow-up, return precautions discussed for fever or worsening or atypical  symptoms   ____________________________________________   FINAL  CLINICAL IMPRESSION(S) / ED DIAGNOSES    Final diagnoses:  Chronic abdominal pain     ED Discharge Orders    None      Portions of this note were generated with dragon dictation software. Dictation errors may occur despite best attempts at proofreading.    Carrie Mew, MD 07/31/18 [phone]

## 2018-07-31 NOTE — Discharge Instructions (Addendum)
Your test today were okay.  Please follow-up with your doctors for continued management of your long-standing abdominal pain which appears to be related to scar tissue in your abdomen.

## 2018-07-31 NOTE — ED Triage Notes (Signed)
C/o pain to lower abdomen. Reports was told has endometriosis.  Hx ablation. No NVD, fevers. Did have increased pain with urination.

## 2018-08-14 ENCOUNTER — Encounter: Payer: Self-pay | Admitting: Obstetrics and Gynecology

## 2018-08-14 ENCOUNTER — Ambulatory Visit (INDEPENDENT_AMBULATORY_CARE_PROVIDER_SITE_OTHER): Payer: BLUE CROSS/BLUE SHIELD | Admitting: Obstetrics and Gynecology

## 2018-08-14 VITALS — BP 128/84 | Ht 62.0 in | Wt 222.0 lb

## 2018-08-14 DIAGNOSIS — N800 Endometriosis of uterus: Secondary | ICD-10-CM

## 2018-08-14 DIAGNOSIS — N8003 Adenomyosis of the uterus: Secondary | ICD-10-CM

## 2018-08-14 DIAGNOSIS — G8929 Other chronic pain: Secondary | ICD-10-CM

## 2018-08-14 DIAGNOSIS — N809 Endometriosis, unspecified: Secondary | ICD-10-CM

## 2018-08-14 DIAGNOSIS — R1031 Right lower quadrant pain: Secondary | ICD-10-CM | POA: Diagnosis not present

## 2018-08-14 DIAGNOSIS — Z124 Encounter for screening for malignant neoplasm of cervix: Secondary | ICD-10-CM | POA: Diagnosis not present

## 2018-08-14 NOTE — Progress Notes (Signed)
Obstetrics & Gynecology Office Visit   Chief Complaint  Patient presents with  . Follow-up    ER Follow Up  . Advice Only    wants to discuss surgery   History of Present Illness: 37 y.o. [id number] female who presents with a history of chronic pain.  She states she has had pain for about 4-5 years.  She was evaluated in 2016 and underwent endometrial ablation. She also had a diagnostic laparoscopy last year which was notable only for filmy adhesions.  She states that she used to go to the pain clinic, but stopped going in March  She states she used to use tramadol.  She states that this does not help.  The pain is mostly on her right side. She describes her pain as stabbing, like having contractions. Her pain hits her in the morning. She states the pain radiates down her right leg.  Now it radiates up to her umbilicus.  Aggravating factors: walking, sitting, lying down, everything.  Alleviating factors: none. Just letting it go away.  The pain is more regular now than it used to be, in that it occurs more frequently.  She also has dyspareunia.  She denies any other associated symptoms.  She has no menses at all since the ablation. Ultrasound in ER on 06/07/2018 suggests possible adenomyosis.  Past Medical History:  Diagnosis Date  . Complication of anesthesia   . Depression   . Endometriosis   . GERD (gastroesophageal reflux disease)    NO MEDS  . Headache    FREQUENTLY  . Ovarian cyst     Past Surgical History:  Procedure Laterality Date  . ABLATION    . CESAREAN SECTION    . HYSTEROSCOPY WITH NOVASURE N/A 05/27/2015   Procedure: HYSTEROSCOPY WITH NOVASURE;  Surgeon: Aletha Halim, MD;  Location: [facility] ORS;  Service: Gynecology;  Laterality: N/A;  . LAPAROSCOPY N/A 07/07/2017   Procedure: LAPAROSCOPY DIAGNOSTIC;  Surgeon: Malachy Mood, MD;  Location: [facility] ORS;  Service: Gynecology;  Laterality: N/A;  . TUBAL LIGATION     Gynecologic History: No LMP recorded. Patient has had an  ablation.  Obstetric History: [id number]  Family History  Problem Relation Age of Onset  . Diabetes Mother   . Hypertension Mother   . Heart disease Father   . Cancer Father   . Heart disease Brother     Social History   Socioeconomic History  . Marital status: Single    Spouse name: Not on file  . Number of children: Not on file  . Years of education: Not on file  . Highest education level: Not on file  Occupational History  . Not on file  Social Needs  . Financial resource strain: Not on file  . Food insecurity:    Worry: Not on file    Inability: Not on file  . Transportation needs:    Medical: Not on file    Non-medical: Not on file  Tobacco Use  . Smoking status: Former Smoker    Years: 15.00    Types: Cigarettes    Last attempt to quit: 06/30/2004    Years since quitting: 14.1  . Smokeless tobacco: Never Used  Substance and Sexual Activity  . Alcohol use: Yes    Comment: OCC  . Drug use: No  . Sexual activity: Yes    Birth control/protection: None  Lifestyle  . Physical activity:    Days per week: Not on file    Minutes per session: Not on file  .  Stress: Not on file  Relationships  . Social connections:    Talks on phone: Not on file    Gets together: Not on file    Attends religious service: Not on file    Active member of club or organization: Not on file    Attends meetings of clubs or organizations: Not on file    Relationship status: Not on file  . Intimate partner violence:    Fear of current or ex partner: Not on file    Emotionally abused: Not on file    Physically abused: Not on file    Forced sexual activity: Not on file  Other Topics Concern  . Not on file  Social History Narrative  . Not on file   Allergies: No Known Allergies  Prior to Admission medications: "diet" pills    Review of Systems  Constitutional: Negative.   HENT: Negative.   Eyes: Negative.   Respiratory: Negative.   Cardiovascular: Negative.   Gastrointestinal:  Negative.   Genitourinary: Negative.   Musculoskeletal: Negative.   Skin: Negative.   Neurological: Negative.   Psychiatric/Behavioral: Negative.      Physical Exam BP 128/84   Ht 5\' 2"  (1.575 m)   Wt 222 lb (100.7 kg)   BMI 40.60 kg/m  No LMP recorded. Patient has had an ablation. Physical Exam  Constitutional: She is oriented to person, place, and time. She appears well-developed and well-nourished. No distress.  Genitourinary: Vagina normal and uterus normal. Pelvic exam was performed with patient supine. There is no rash, tenderness or lesion on the right labia. There is no rash, tenderness or lesion on the left labia. Right adnexum does not display mass, does not display tenderness and does not display fullness. Left adnexum does not display mass, does not display tenderness and does not display fullness. Cervix does not exhibit motion tenderness, lesion or polyp.  HENT:  Head: Normocephalic and atraumatic.  Eyes: EOM are normal. No scleral icterus.  Neck: Normal range of motion. Neck supple.  Cardiovascular: Normal rate and regular rhythm. Exam reveals no gallop and no friction rub.  No murmur heard. Pulmonary/Chest: Effort normal and breath sounds normal. No respiratory distress. She has no wheezes. She has no rales.  Abdominal: Soft. Bowel sounds are normal. She exhibits no distension and no mass. There is no tenderness. There is no rebound and no guarding.  Musculoskeletal: Normal range of motion. She exhibits no edema.  Neurological: She is alert and oriented to person, place, and time. No cranial nerve deficit.  Skin: Skin is warm and dry. No erythema.  Psychiatric: She has a normal mood and affect. Her behavior is normal. Judgment normal.    Female chaperone present for pelvic and breast  portions of the physical exam  Assessment: 37 y.o. [id number] female here for  1. Chronic abdominal pain (RLQ) (Primary Area of Pain) (Right)   2. Pap smear for cervical cancer screening    3. Adenomyosis      Plan: Problem List Items Addressed This Visit      Other   Chronic abdominal pain (RLQ) (Primary Area of Pain) (Right) - Primary (Chronic)   Adenomyosis    Other Visit Diagnoses    Pap smear for cervical cancer screening       Relevant Orders   Cytology - PAP (Completed)     Long discussion with the patient regarding potential etiologies of her pain, including; GI, GU, MSK, Gynecologic, and psychiatric.  She does have an ultrasound read that  states that adenomyosis appears to be likely.  Discussed this findings. She greatly desires a hysterectomy.  I have discussed alternative treatments with her.  She has had an endometrial ablation and is not a candidate for an IUD.  I counseled her that a hysterectomy might not give her pain relief. However, given that no other etiology has been found, and she likely has adenomyosis, I have agreed to offer her a hysterectomy.  She is aware that there is no guarantee of pain relief since we do not have a definite diagnosis. Even with this counseling, she strongly wishes to proceed.  Pap smear performed today.  Prentice Docker, MD 08/15/2018 12:15 PM

## 2018-08-15 ENCOUNTER — Other Ambulatory Visit (HOSPITAL_COMMUNITY)
Admission: RE | Admit: 2018-08-15 | Discharge: 2018-08-15 | Disposition: A | Discharge status: Home / Self Care | Payer: BLUE CROSS/BLUE SHIELD | Source: Ambulatory Visit | Attending: Obstetrics and Gynecology | Admitting: Obstetrics and Gynecology

## 2018-08-15 DIAGNOSIS — Z124 Encounter for screening for malignant neoplasm of cervix: Secondary | ICD-10-CM | POA: Insufficient documentation

## 2018-08-16 ENCOUNTER — Other Ambulatory Visit: Payer: Self-pay

## 2018-08-16 ENCOUNTER — Emergency Department
Admission: EM | Admit: 2018-08-16 | Discharge: 2018-08-16 | Disposition: A | Discharge status: Home / Self Care | Payer: BLUE CROSS/BLUE SHIELD | Attending: Emergency Medicine | Admitting: Emergency Medicine

## 2018-08-16 DIAGNOSIS — R109 Unspecified abdominal pain: Secondary | ICD-10-CM

## 2018-08-16 DIAGNOSIS — Z79899 Other long term (current) drug therapy: Secondary | ICD-10-CM | POA: Diagnosis not present

## 2018-08-16 DIAGNOSIS — R1031 Right lower quadrant pain: Secondary | ICD-10-CM | POA: Diagnosis not present

## 2018-08-16 DIAGNOSIS — G8929 Other chronic pain: Secondary | ICD-10-CM | POA: Insufficient documentation

## 2018-08-16 DIAGNOSIS — Z87891 Personal history of nicotine dependence: Secondary | ICD-10-CM | POA: Insufficient documentation

## 2018-08-16 LAB — COMPREHENSIVE METABOLIC PANEL
ALBUMIN: 4.1 g/dL (ref 3.5–5.0)
ALT: 39 U/L (ref 0–44)
ANION GAP: 7 (ref 5–15)
AST: 37 U/L (ref 15–41)
Alkaline Phosphatase: 53 U/L (ref 38–126)
BILIRUBIN TOTAL: 0.5 mg/dL (ref 0.3–1.2)
BUN: 10 mg/dL (ref 6–20)
CO2: 26 mmol/L (ref 22–32)
Calcium: 8.8 mg/dL — ABNORMAL LOW (ref 8.9–10.3)
Chloride: 105 mmol/L (ref 98–111)
Creatinine, Ser: 0.95 mg/dL (ref 0.44–1.00)
GFR calc Af Amer: >60 mL/min (ref >60)
Glucose, Bld: 102 mg/dL — ABNORMAL HIGH (ref 70–99)
Potassium: 3.4 mmol/L — ABNORMAL LOW (ref 3.5–5.1)
Sodium: 138 mmol/L (ref 135–145)
TOTAL PROTEIN: 8 g/dL (ref 6.5–8.1)

## 2018-08-16 LAB — URINALYSIS, ROUTINE W REFLEX MICROSCOPIC
Bilirubin Urine: NEGATIVE
Glucose, UA: NEGATIVE mg/dL
Hgb urine dipstick: NEGATIVE
Ketones, ur: NEGATIVE mg/dL
LEUKOCYTES UA: NEGATIVE
Nitrite: NEGATIVE
PROTEIN: NEGATIVE mg/dL
Specific Gravity, Urine: 1.019 (ref 1.005–1.030)
pH: 6 (ref 5.0–8.0)

## 2018-08-16 LAB — CBC WITH DIFFERENTIAL/PLATELET
BASOS ABS: 0.1 10*3/uL (ref 0–0.1)
Basophils Relative: 2 %
EOS ABS: 0.2 10*3/uL (ref 0–0.7)
EOS PCT: 3 %
HCT: 42 % (ref 35.0–47.0)
Hemoglobin: 14.6 g/dL (ref 12.0–16.0)
LYMPHS ABS: 2.9 10*3/uL (ref 1.0–3.6)
LYMPHS PCT: 36 %
MCH: 31.1 pg (ref 26.0–34.0)
MCHC: 34.7 g/dL (ref 32.0–36.0)
MCV: 89.6 fL (ref 80.0–100.0)
MONOS PCT: 6 %
Monocytes Absolute: 0.5 10*3/uL (ref 0.2–0.9)
Neutro Abs: 4.4 10*3/uL (ref 1.4–6.5)
Neutrophils Relative %: 53 %
PLATELETS: 363 10*3/uL (ref 150–440)
RBC: 4.68 MIL/uL (ref 3.80–5.20)
RDW: 13.5 % (ref 11.5–14.5)
WBC: 8.1 10*3/uL (ref 3.6–11.0)

## 2018-08-16 LAB — LIPASE, BLOOD: LIPASE: 29 U/L (ref 11–51)

## 2018-08-16 LAB — POCT PREGNANCY, URINE: PREG TEST UR: NEGATIVE

## 2018-08-16 MED ORDER — KETOROLAC TROMETHAMINE 30 MG/ML IJ SOLN
15.0000 mg | Freq: Once | INTRAMUSCULAR | Status: AC
  Administered 2018-08-16: 15 mg via INTRAVENOUS
  Filled 2018-08-16: qty 1

## 2018-08-16 MED ORDER — ACETAMINOPHEN 500 MG PO TABS
1000.0000 mg | ORAL_TABLET | Freq: Once | ORAL | Status: AC
  Administered 2018-08-16: 1000 mg via ORAL
  Filled 2018-08-16: qty 2

## 2018-08-16 NOTE — Discharge Instructions (Signed)
You have been seen in the Emergency Department (ED) for abdominal pain.  Your evaluation did not identify a clear cause of your symptoms but was generally reassuring.  Please follow up as instructed above regarding today's emergent visit and the symptoms that are bothering you.  As we discussed, we cannot provide narcotics for chronic pain according to the [facility] policy.  Return to the ED if your abdominal pain worsens or fails to improve, you develop bloody vomiting, bloody diarrhea, you are unable to tolerate fluids due to vomiting, fever greater than 101, or other symptoms that concern you.  

## 2018-08-16 NOTE — ED Triage Notes (Signed)
Pt arrived via [facility] from work with c/o RLQ pain of "10/10". EMS states pt was working when pain came up suddenly and has been going on for the last hour. Pt has hx of endometrosis and hope to have full hysterectomy soon.

## 2018-08-16 NOTE — ED Provider Notes (Signed)
[facility] Emergency Department Provider Note  ____________________________________________   First MD Initiated Contact with Patient 08/16/18 [phone]     (approximate)  I have reviewed the triage vital signs and the nursing notes.   HISTORY  Chief Complaint Abdominal Pain    HPI Sherry Watkins is a 37 y.o. female with history of chronic abdominal/pelvic pain who presents by EMS from work for evaluation of acute onset of pain that feels similar to prior.  She reports that it is in her lower abdomen in the middle and right side as per usual.  She has been seen multiple times at this facility as well as at [facility] for the same symptoms.  She reports that she went to see [facility] 2 days ago and they are making plans for a total hysterectomy in the hopes that this will help her chronic pain and endometriosis.  She has been a patient of the pain clinic in the past but reports that due to "money problems" she has not gone back for a while.  Her pain is exacerbated while at work where she walks extensively through the course of her shift.  Nothing in particular makes it better.  She denies fever/chills, chest pain, shortness of breath, nausea, vomiting, diarrhea, constipation, and dysuria.  She does not have menstrual periods due to having a prior ablation.  She has not had any recent injuries.   Past Medical History:  Diagnosis Date  . Complication of anesthesia   . Depression   . Endometriosis   . GERD (gastroesophageal reflux disease)    NO MEDS  . Headache    FREQUENTLY  . Ovarian cyst     Patient Active Problem List   Diagnosis Date Noted  . Chronic low back pain (Right) without sciatica 02/09/2018  . Elevated sed rate 02/01/2018  . Vitamin D deficiency 02/01/2018  . Chronic sacroiliac joint pain (Right) 02/01/2018  . Other specified dorsopathies, sacral and sacrococcygeal region 02/01/2018  . Chronic abdominal pain (RLQ) (Primary Area of Pain)  (Right) 12/26/2017  . Chronic lower extremity pain (Secondary Area of Pain) (Right) 12/26/2017  . Chronic low back pain with right-sided sciatica [facility] Area of Pain) (Bilateral) (R>L) 12/26/2017  . Chronic pain syndrome 12/26/2017  . Opiate use 12/26/2017  . Problems influencing health status 12/26/2017  . Pharmacologic therapy 12/26/2017  . Disorder of skeletal system 12/26/2017    Past Surgical History:  Procedure Laterality Date  . ABLATION    . CESAREAN SECTION    . HYSTEROSCOPY WITH NOVASURE N/A 05/27/2015   Procedure: HYSTEROSCOPY WITH NOVASURE;  Surgeon: Aletha Halim, MD;  Location: [facility] ORS;  Service: Gynecology;  Laterality: N/A;  . LAPAROSCOPY N/A 07/07/2017   Procedure: LAPAROSCOPY DIAGNOSTIC;  Surgeon: Malachy Mood, MD;  Location: [facility] ORS;  Service: Gynecology;  Laterality: N/A;  . TUBAL LIGATION      Prior to Admission medications   Medication Sig Start Date End Date Taking? Authorizing Provider  acetaminophen (TYLENOL) 500 MG tablet Take 1,000 mg by mouth every 6 (six) hours as needed for mild pain.     [provider]  Calcium Carbonate-Vit D-Min (GNP CALCIUM 1200) 1200-1000 MG-UNIT CHEW Chew 1,200 mg by mouth daily with breakfast. Take in combination with vitamin D and magnesium. 02/01/18 07/31/18  Milinda Pointer, MD  ibuprofen (ADVIL,MOTRIN) 600 MG tablet Take 1 tablet (600 mg total) by mouth every 8 (eight) hours as needed. 05/08/18   Johnn Hai, PA-C  naproxen (NAPROSYN) 500 MG  tablet Take 1 tablet (500 mg total) by mouth 2 (two) times daily with a meal. 06/07/18   Paulette Blanch, MD  oxyCODONE-acetaminophen (PERCOCET/ROXICET) 5-325 MG tablet Take 1 tablet by mouth every 4 (four) hours as needed for severe pain. Patient not taking: Reported on 06/12/2018 01/27/18   Darel Hong, MD  oxyCODONE-acetaminophen (PERCOCET/ROXICET) 5-325 MG tablet Take 1 tablet by mouth every 4 (four) hours as needed for up to 4 doses for severe pain. Patient not  taking: Reported on 07/31/2018 06/12/18   Nena Polio, MD  traMADol (ULTRAM) 50 MG tablet Take 1 tablet (50 mg total) by mouth every 6 (six) hours as needed for severe pain. 02/01/18 03/03/18  Milinda Pointer, MD  traMADol (ULTRAM) 50 MG tablet Take 50 mg by mouth every 6 (six) hours as needed for pain. 06/08/18   [provider]    Allergies Patient has no known allergies.  Family History  Problem Relation Age of Onset  . Diabetes Mother   . Hypertension Mother   . Heart disease Father   . Cancer Father   . Heart disease Brother     Social History Social History   Tobacco Use  . Smoking status: Former Smoker    Years: 15.00    Types: Cigarettes    Last attempt to quit: 06/30/2004    Years since quitting: 14.1  . Smokeless tobacco: Never Used  Substance Use Topics  . Alcohol use: Yes    Comment: OCC  . Drug use: No    Review of Systems Constitutional: No fever/chills Eyes: No visual changes. ENT: No sore throat. Cardiovascular: Denies chest pain. Respiratory: Denies shortness of breath. Gastrointestinal: Abdominal pain as listed above.  Denies nausea, vomiting, diarrhea. Genitourinary: Negative for dysuria. Musculoskeletal: Negative for neck pain.  Negative for back pain. Integumentary: Negative for rash. Neurological: Negative for headaches, focal weakness or numbness.   ____________________________________________   PHYSICAL EXAM:  VITAL SIGNS: ED Triage Vitals  Enc Vitals Group     BP 08/16/18 0559 (!) 178/96     Pulse Rate 08/16/18 0559 90     Resp 08/16/18 0559 (!) 22     Temp 08/16/18 0559 98 F (36.7 C)     Temp src --      SpO2 08/16/18 0559 98 %     Weight 08/16/18 0600 100.7 kg (222 lb)     Height 08/16/18 0600 1.575 m (5\' 2" )     Head Circumference --      Peak Flow --      Pain Score 08/16/18 0600 10     Pain Loc --      Pain Edu? --      Excl. in [place]? --     Constitutional: Alert and oriented.  Generally well-appearing, has  some tears on her face but does not appear to be in acute distress at this time. Eyes: Conjunctivae are normal.  Head: Atraumatic. Nose: No congestion/rhinnorhea. Mouth/Throat: Mucous membranes are moist. Neck: No stridor.  No meningeal signs.   Cardiovascular: Normal rate, regular rhythm. Good peripheral circulation. Grossly normal heart sounds. Respiratory: Normal respiratory effort.  No retractions. Lungs CTAB. Gastrointestinal: Soft and nontender. No distention.  Genitourinary: Deferred Musculoskeletal: No lower extremity tenderness nor edema. No gross deformities of extremities. Neurologic:  Normal speech and language. No gross focal neurologic deficits are appreciated.  Skin:  Skin is warm, dry and intact. No rash noted. Psychiatric: The patient is obviously frustrated by her persistent symptoms.  Her temper  seems a bit short but she was calm and appropriate with me.  ____________________________________________   LABS (all labs ordered are listed, but only abnormal results are displayed)  Labs Reviewed  COMPREHENSIVE METABOLIC PANEL - Abnormal; Notable for the following components:      Result Value   Potassium 3.4 (*)    Glucose, Bld 102 (*)    Calcium 8.8 (*)    All other components within normal limits  URINALYSIS, ROUTINE W REFLEX MICROSCOPIC - Abnormal; Notable for the following components:   Color, Urine YELLOW (*)    APPearance CLEAR (*)    All other components within normal limits  CBC WITH DIFFERENTIAL/PLATELET  LIPASE, BLOOD  POC URINE PREG, ED  POCT PREGNANCY, URINE   ____________________________________________  EKG  None - EKG not ordered by ED physician ____________________________________________  RADIOLOGY   ED MD interpretation: No indication for imaging  Official radiology report(s): No results found.  ____________________________________________   PROCEDURES  Critical Care performed: No   Procedure(s) performed:    Procedures   ____________________________________________   INITIAL IMPRESSION / ASSESSMENT AND PLAN / ED COURSE  As part of my medical decision making, I reviewed the following data within the [place] notes reviewed and incorporated, Labs reviewed , Old chart reviewed, Notes from prior ED visits and [place] Controlled Substance Database    Differential diagnosis includes, but is not limited to, acute exacerbation of her chronic pain,endometriosis, appendicitis, biliary colic, ovarian torsion.  In spite of the patient's pain, she appears relatively comfortable and more frustrated than anything.  She says she has been dealing with this for years.  I review of her medical record confirms that she has been seen at this facility multiple times as well as at [facility] including a visit in March 2019 where she left the [facility] emergency department after receiving some Valium and prior to the completion of her work-up.  The she was seen at this facility about 2 weeks ago for the same symptoms.  She is under the care of a pain clinic even though she is chosen to not go back to it recently.  Her vital signs are stable other than her hypertension.  She has only very mild diffuse tenderness to palpation throughout the abdomen which is reassuring.  I believe there is no indication for additional imaging at this time.  She says she is going to call [facility] today to try to expedite follow-up.  I am checking basic abdominal pain labs and will provide non-opioid analgesia in the form of Toradol 50 mg IV and Tylenol 1000 mg by mouth.  I explained this plan to her and explained that I would not be giving any opioids but she says that she understands and agrees with the plan.  Explained that as long as her laboratory work-up was reassuring, the plan will be for discharge and outpatient follow-up and she agrees.   Clinical Course as of Aug 16 741  Wed Aug 16, 2018  0714 Laboratory work-up  all reassuring with no leukocytosis, essentially normal metabolic panel other than very slightly low potassium, normal urinalysis, normal lipase.  Will discharge for outpatient follow-up.  I provided my usual customary recommendations and return precautions.   [CF]    Clinical Course User Index [CF] Hinda Kehr, MD    ____________________________________________  FINAL CLINICAL IMPRESSION(S) / ED DIAGNOSES  Final diagnoses:  Chronic abdominal pain     MEDICATIONS GIVEN DURING THIS VISIT:  Medications  ketorolac (TORADOL) 30  MG/ML injection 15 mg (15 mg Intravenous Given 08/16/18 9539)  acetaminophen (TYLENOL) tablet 1,000 mg (1,000 mg Oral Given 08/16/18 6728)     ED Discharge Orders    None       Note:  This document was prepared using Dragon voice recognition software and may include unintentional dictation errors.    Hinda Kehr, MD 08/16/18 [phone]

## 2018-08-17 ENCOUNTER — Encounter: Payer: Self-pay | Admitting: Obstetrics and Gynecology

## 2018-08-17 DIAGNOSIS — N8003 Adenomyosis of the uterus: Secondary | ICD-10-CM | POA: Insufficient documentation

## 2018-08-17 DIAGNOSIS — N809 Endometriosis, unspecified: Secondary | ICD-10-CM

## 2018-08-17 LAB — CYTOLOGY - PAP
Diagnosis: NEGATIVE
HPV: NOT DETECTED

## 2018-08-18 ENCOUNTER — Telehealth: Payer: Self-pay | Admitting: Obstetrics and Gynecology

## 2018-08-18 NOTE — Telephone Encounter (Signed)
Patient is aware of H&P at [facility] on Monday, 08/21/18 @ 11:30am w/ Dr Glennon Mac, Pre-admit Testing at 1:30pm, and OR on 08/24/18. Patient is aware she may receive calls from the [facility] and [facility]. Patient intends to drop off short-term disability paperwork on 08/22/18, is aware of $25 fee, and per Velva Harman, that we ask for 10 business days for completion, but the paperwork does not have to be returned prior to surgery. Patient confirmed BCBS, and secondary Medicaid FPW. Patient is aware Medicaid FPW should not cover this surgery.

## 2018-08-18 NOTE — Telephone Encounter (Signed)
-----   Message from Will Bonnet, MD sent at 08/17/2018  8:15 PM EDT ----- Regarding: Schedule surgery Surgery Booking Request Patient Full Name:  Sherry Watkins  MRN: [id number]  DOB: 07-Aug-1981  Surgeon: Prentice Docker, MD  Requested Surgery Date and Time: TBD Primary Diagnosis AND Code: adenomyosis, chronic lower abdominal pain Secondary Diagnosis and Code:  Surgical Procedure: ROBOT assisted total laparoscopic hysterectomy, bilateral salpingectomy, cystoscopy L&D Notification: No Admission Status: same day surgery Length of Surgery: 2 hours Special Case Needs: [facility] Robot H&P: TBD (date) Phone Interview???: no Interpreter: Language:  Medical Clearance: no Special Scheduling Instructions: [place].  If OR cannot supply assistant, then will need to schedule on day with AMS in OR with me.

## 2018-08-21 ENCOUNTER — Ambulatory Visit (INDEPENDENT_AMBULATORY_CARE_PROVIDER_SITE_OTHER): Payer: BLUE CROSS/BLUE SHIELD | Admitting: Obstetrics and Gynecology

## 2018-08-21 ENCOUNTER — Encounter
Admission: RE | Admit: 2018-08-21 | Discharge: 2018-08-21 | Disposition: A | Discharge status: Home / Self Care | Payer: BLUE CROSS/BLUE SHIELD | Source: Ambulatory Visit | Attending: Obstetrics and Gynecology | Admitting: Obstetrics and Gynecology

## 2018-08-21 ENCOUNTER — Encounter: Payer: Self-pay | Admitting: Obstetrics and Gynecology

## 2018-08-21 ENCOUNTER — Other Ambulatory Visit: Payer: Self-pay

## 2018-08-21 VITALS — BP 124/78 | Ht 63.0 in | Wt 222.0 lb

## 2018-08-21 DIAGNOSIS — N8003 Adenomyosis of the uterus: Secondary | ICD-10-CM

## 2018-08-21 DIAGNOSIS — R1031 Right lower quadrant pain: Secondary | ICD-10-CM | POA: Diagnosis not present

## 2018-08-21 DIAGNOSIS — N800 Endometriosis of uterus: Secondary | ICD-10-CM | POA: Diagnosis not present

## 2018-08-21 DIAGNOSIS — N809 Endometriosis, unspecified: Secondary | ICD-10-CM

## 2018-08-21 DIAGNOSIS — G8929 Other chronic pain: Secondary | ICD-10-CM | POA: Diagnosis not present

## 2018-08-21 DIAGNOSIS — Z01812 Encounter for preprocedural laboratory examination: Secondary | ICD-10-CM | POA: Diagnosis not present

## 2018-08-21 NOTE — Progress Notes (Signed)
Preoperative History and Physical  Sherry Watkins is a 37 y.o. [phone] here for surgical management of chronic abdominal pain (right) and adenomysosis.   No significant preoperative concerns.  History of Present Illness: 37 y.o. G46P4004 female who presents for pre-operative evaluation with a history of chronic pain.  She states she has had pain for about 4-5 years.  She was evaluated in 2016 and underwent endometrial ablation. She also had a diagnostic laparoscopy last year which was notable only for filmy adhesions.  She states that she used to go to the pain clinic, but stopped going in March  She states she used to use tramadol.  She states that this does not help.  The pain is mostly on her right side. She describes her pain as stabbing, like having contractions. Her pain hits her in the morning. She states the pain radiates down her right leg.  Now it radiates up to her umbilicus.  Aggravating factors: walking, sitting, lying down, everything.  Alleviating factors: none. Just letting it go away.  The pain is more regular now than it used to be, in that it occurs more frequently.  She also has dyspareunia.  She denies any other associated symptoms.  She has no menses at all since the ablation. Ultrasound in ER on 06/07/2018 suggests possible adenomyosis.  Proposed surgery: robot assisted total laparoscopic hysterectomy, bilateral salpingectomy, cystoscopy  Past Medical History:  Diagnosis Date  . Complication of anesthesia   . Depression   . Endometriosis   . GERD (gastroesophageal reflux disease)    NO MEDS  . Headache    FREQUENTLY  . Ovarian cyst    Past Surgical History:  Procedure Laterality Date  . ABLATION    . CESAREAN SECTION    . HYSTEROSCOPY WITH NOVASURE N/A 05/27/2015   Procedure: HYSTEROSCOPY WITH NOVASURE;  Surgeon: Aletha Halim, MD;  Location: [facility];  Service: Gynecology;  Laterality: N/A;  . LAPAROSCOPY N/A 07/07/2017   Procedure: LAPAROSCOPY DIAGNOSTIC;  Surgeon:  Malachy Mood, MD;  Location: [facility];  Service: Gynecology;  Laterality: N/A;  . TUBAL LIGATION     OB History  Gravida Para Term Preterm AB Living  4 4 4     4   SAB TAB Ectopic Multiple Live Births          4    # Outcome Date GA Lbr Len/2nd Weight Sex Delivery Anes PTL Lv  4 Term           3 Term           2 Term           1 Term           Patient denies any other pertinent gynecologic issues.   Current Outpatient Medications on File Prior to Visit  Medication Sig Dispense Refill  . Calcium Carbonate-Vit D-Min (GNP CALCIUM 1200) 1200-1000 MG-UNIT CHEW Chew 1,200 mg by mouth daily with breakfast. Take in combination with vitamin D and magnesium. (Patient not taking: Reported on 08/18/2018) 30 tablet 5  . ibuprofen (ADVIL,MOTRIN) 200 MG tablet Take 200 mg by mouth every 6 (six) hours as needed for headache or moderate pain.    . Multiple Vitamins-Minerals (WOMENS MULTIVITAMIN PO) Take 1 tablet by mouth at bedtime.    . naproxen (NAPROSYN) 500 MG tablet Take 1 tablet (500 mg total) by mouth 2 (two) times daily with a meal. (Patient not taking: Reported on 08/18/2018) 14 tablet 0  . naproxen sodium (ALEVE) 220 MG tablet  Take 220 mg by mouth daily as needed (for pain or headache).    . OVER THE COUNTER MEDICATION Take 2 tablets by mouth daily. Keto Diet Supplement    . oxyCODONE-acetaminophen (PERCOCET/ROXICET) 5-325 MG tablet Take 1 tablet by mouth every 4 (four) hours as needed for up to 4 doses for severe pain. (Patient not taking: Reported on 07/31/2018) 4 tablet 0  . traMADol (ULTRAM) 50 MG tablet Take 1 tablet (50 mg total) by mouth every 6 (six) hours as needed for severe pain. 120 tablet 0   No current facility-administered medications on file prior to visit.    Allergies: No Known Allergies  Social History:   reports that she quit smoking about 14 years ago. Her smoking use included cigarettes. She quit after 15.00 years of use. She has never used smokeless tobacco. She reports  that she drinks alcohol. She reports that she does not use drugs.  Family History  Problem Relation Age of Onset  . Diabetes Mother   . Hypertension Mother   . Heart disease Father   . Cancer Father   . Heart disease Brother     Review of Systems:  Review of Systems  Constitutional: Negative.   HENT: Negative.   Eyes: Negative.   Respiratory: Negative.   Cardiovascular: Negative.   Gastrointestinal: Negative.   Genitourinary: Negative.   Musculoskeletal: Negative.   Skin: Negative.   Neurological: Negative.   Psychiatric/Behavioral: Negative.      PHYSICAL EXAM: Blood pressure 124/78, height 5\' 3"  (1.6 m), weight 222 lb (100.7 kg). CONSTITUTIONAL: Well-developed, well-nourished female in no acute distress.  HENT:  Normocephalic, atraumatic, External right and left ear normal. Oropharynx is clear and moist EYES: Conjunctivae and EOM are normal. Pupils are equal, round, and reactive to light. No scleral icterus.  NECK: Normal range of motion, supple, no masses SKIN: Skin is warm and dry. No rash noted. Not diaphoretic. No erythema. No pallor. [place]: Alert and oriented to person, place, and time. Normal reflexes, muscle tone coordination. No cranial nerve deficit noted. PSYCHIATRIC: Normal mood and affect. Normal behavior. Normal judgment and thought content. CARDIOVASCULAR: Normal heart rate noted, regular rhythm RESPIRATORY: Effort and breath sounds normal, no problems with respiration noted ABDOMEN: Soft, nontender, nondistended. PELVIC: Deferred MUSCULOSKELETAL: Normal range of motion. No edema and no tenderness. 2+ distal pulses.  Labs: Results for orders placed or performed during the hospital encounter of 08/16/18 (from the past 336 hour(s))  CBC with Differential/Platelet   Collection Time: 08/16/18  6:03 AM  Result Value Ref Range   WBC 8.1 3.6 - 11.0 K/uL   RBC 4.68 3.80 - 5.20 MIL/uL   Hemoglobin 14.6 12.0 - 16.0 g/dL   HCT 42.0 35.0 - 47.0 %   MCV 89.6  80.0 - 100.0 fL   MCH 31.1 26.0 - 34.0 pg   MCHC 34.7 32.0 - 36.0 g/dL   RDW 13.5 11.5 - 14.5 %   Platelets 363 150 - 440 K/uL   Neutrophils Relative % 53 %   Neutro Abs 4.4 1.4 - 6.5 K/uL   Lymphocytes Relative 36 %   Lymphs Abs 2.9 1.0 - 3.6 K/uL   Monocytes Relative 6 %   Monocytes Absolute 0.5 0.2 - 0.9 K/uL   Eosinophils Relative 3 %   Eosinophils Absolute 0.2 0 - 0.7 K/uL   Basophils Relative 2 %   Basophils Absolute 0.1 0 - 0.1 K/uL  Lipase, blood   Collection Time: 08/16/18  6:03 AM  Result Value Ref  Range   Lipase 29 11 - 51 U/L  Comprehensive metabolic panel   Collection Time: 08/16/18  6:03 AM  Result Value Ref Range   Sodium 138 135 - 145 mmol/L   Potassium 3.4 (L) 3.5 - 5.1 mmol/L   Chloride 105 98 - 111 mmol/L   CO2 26 22 - 32 mmol/L   Glucose, Bld 102 (H) 70 - 99 mg/dL   BUN 10 6 - 20 mg/dL   Creatinine, Ser 0.95 0.44 - 1.00 mg/dL   Calcium 8.8 (L) 8.9 - 10.3 mg/dL   Total Protein 8.0 6.5 - 8.1 g/dL   Albumin 4.1 3.5 - 5.0 g/dL   AST 37 15 - 41 U/L   ALT 39 0 - 44 U/L   Alkaline Phosphatase 53 38 - 126 U/L   Total Bilirubin 0.5 0.3 - 1.2 mg/dL   GFR calc non Af Amer >60 >60 mL/min   GFR calc Af Amer >60 >60 mL/min   Anion gap 7 5 - 15  Urinalysis, Routine w reflex microscopic   Collection Time: 08/16/18  6:18 AM  Result Value Ref Range   Color, Urine YELLOW (A) YELLOW   APPearance CLEAR (A) CLEAR   Specific Gravity, Urine 1.019 1.005 - 1.030   pH 6.0 5.0 - 8.0   Glucose, UA NEGATIVE NEGATIVE mg/dL   Hgb urine dipstick NEGATIVE NEGATIVE   Bilirubin Urine NEGATIVE NEGATIVE   Ketones, ur NEGATIVE NEGATIVE mg/dL   Protein, ur NEGATIVE NEGATIVE mg/dL   Nitrite NEGATIVE NEGATIVE   Leukocytes, UA NEGATIVE NEGATIVE  Pregnancy, urine POC   Collection Time: 08/16/18  6:23 AM  Result Value Ref Range   Preg Test, Ur NEGATIVE NEGATIVE  Results for orders placed or performed in visit on 08/14/18 (from the past 336 hour(s))  Cytology - PAP   Collection Time:  08/15/18 12:00 AM  Result Value Ref Range   Adequacy      Satisfactory for evaluation  endocervical/transformation zone component PRESENT.   Diagnosis      NEGATIVE FOR INTRAEPITHELIAL LESIONS OR MALIGNANCY.   HPV NOT DETECTED    Material Submitted CervicoVaginal Pap [ThinPrep Imaged]    Assessment: Patient Active Problem List   Diagnosis Date Noted  . Adenomyosis 08/17/2018  . Chronic abdominal pain (RLQ) (Primary Area of Pain) (Right) 12/26/2017    Plan: Patient will undergo surgical management with the above surgery.   The risks of surgery were discussed in detail with the patient including but not limited to: bleeding which may require transfusion or reoperation; infection which may require antibiotics; injury to surrounding organs which may involve bowel, bladder, ureters ; need for additional procedures including laparoscopy or laparotomy; thromboembolic phenomenon, surgical site problems and other postoperative/anesthesia complications. Likelihood of success in alleviating the patient's condition was discussed. Routine postoperative instructions will be reviewed with the patient and her family in detail afte r surgery.  The patient concurred with the proposed plan, giving informed written consent for the surgery.  Preoperative prophylactic antibiotics, as indicated, and SCDs ordered on call to the OR.    Prentice Docker, MD 08/21/2018 1:00 PM

## 2018-08-21 NOTE — Patient Instructions (Signed)
Your procedure is scheduled on: Thursday 08/24/18  Report to [place]. To find out your arrival time please call [phone] between 1PM - 3PM on Wednesday 08/23/18  Remember: Instructions that are not followed completely may result in serious medical risk, up to and including death, or upon the discretion of your surgeon and anesthesiologist your surgery may need to be rescheduled.     _X__ 1. Do not eat food after midnight the night before your procedure.                 No gum chewing or hard candies. You may drink clear liquids up to 2 hours                 before you are scheduled to arrive for your surgery- DO NOT drink clear                 liquids within 2 hours of the start of your surgery.                 Clear Liquids include:  water, apple juice without pulp, clear carbohydrate                 drink such as Clearfast or Gatorade, Black Coffee or Tea (Do not add                 anything to coffee or tea).  __X__2.  On the morning of surgery brush your teeth with toothpaste and water, you may rinse your mouth with mouthwash if you wish.  Do not swallow any toothpaste of mouthwash.     _X__ 3.  No Alcohol for 24 hours before or after surgery.   _X__ 4.  Do Not Smoke or use e-cigarettes For 24 Hours Prior to Your Surgery.                 Do not use any chewable tobacco products for at least 6 hours prior to                 surgery.  ____  5.  Bring all medications with you on the day of surgery if instructed.   __X__  6.  Notify your doctor if there is any change in your medical condition      (cold, fever, infections).     Do not wear jewelry, make-up, hairpins, clips or nail polish. Do not wear lotions, powders, or perfumes.  Do not shave 48 hours prior to surgery. Men may shave face and neck. Do not bring valuables to the hospital.    [facility] is not responsible for any belongings or valuables.  Contacts,  dentures/partials or body piercings may not be worn into surgery. Bring a case for your contacts, glasses or hearing aids, a denture cup will be supplied. Leave your suitcase in the car. After surgery it may be brought to your room. For patients admitted to the hospital, discharge time is determined by your treatment team.   Patients discharged the day of surgery will not be allowed to drive home.   Please read over the following fact sheets that you were given:   MRSA Information  __X__ Take these medicines the morning of surgery with A SIP OF WATER:     1. NONE  2.   3.  4.  5.  6.     __X__ Use CHG Soap as directed  __X__ Stop Blood  Thinners Coumadin/Plavix/Xarelto/Pleta/Pradaxa/Eliquis/Effient/Aspirin   __X__ Stop Anti-inflammatories 7 days before surgery such as Advil, Ibuprofen, Motrin, BC or Goodies Powder, Naprosyn, Naproxen, Aleve, Aspirin, Meloxicam. May take Tylenol if needed for pain or discomfort.   __X__ Stop all herbal supplements

## 2018-08-21 NOTE — H&P (View-Only) (Signed)
Preoperative History and Physical  Sherry Watkins is a 37 y.o. [phone] here for surgical management of chronic abdominal pain (right) and adenomysosis.   No significant preoperative concerns.  History of Present Illness: 37 y.o. G64P4004 female who presents for pre-operative evaluation with a history of chronic pain.  She states she has had pain for about 4-5 years.  She was evaluated in 2016 and underwent endometrial ablation. She also had a diagnostic laparoscopy last year which was notable only for filmy adhesions.  She states that she used to go to the pain clinic, but stopped going in March  She states she used to use tramadol.  She states that this does not help.  The pain is mostly on her right side. She describes her pain as stabbing, like having contractions. Her pain hits her in the morning. She states the pain radiates down her right leg.  Now it radiates up to her umbilicus.  Aggravating factors: walking, sitting, lying down, everything.  Alleviating factors: none. Just letting it go away.  The pain is more regular now than it used to be, in that it occurs more frequently.  She also has dyspareunia.  She denies any other associated symptoms.  She has no menses at all since the ablation. Ultrasound in ER on 06/07/2018 suggests possible adenomyosis.  Proposed surgery: robot assisted total laparoscopic hysterectomy, bilateral salpingectomy, cystoscopy  Past Medical History:  Diagnosis Date  . Complication of anesthesia   . Depression   . Endometriosis   . GERD (gastroesophageal reflux disease)    NO MEDS  . Headache    FREQUENTLY  . Ovarian cyst    Past Surgical History:  Procedure Laterality Date  . ABLATION    . CESAREAN SECTION    . HYSTEROSCOPY WITH NOVASURE N/A 05/27/2015   Procedure: HYSTEROSCOPY WITH NOVASURE;  Surgeon: Aletha Halim, MD;  Location: [facility];  Service: Gynecology;  Laterality: N/A;  . LAPAROSCOPY N/A 07/07/2017   Procedure: LAPAROSCOPY DIAGNOSTIC;  Surgeon:  Malachy Mood, MD;  Location: [facility] OR[facility];  Service: Gynecology;  Laterality: N/A;  . TUBAL LIGATION     OB History  Gravida Para Term Preterm AB Living  4 4 4     4   SAB TAB Ectopic Multiple Live Births          4    # Outcome Date GA Lbr Len/2nd Weight Sex Delivery Anes PTL Lv  4 Term           3 Term           2 Term           1 Term           Patient denies any other pertinent gynecologic issues.   Current Outpatient Medications on File Prior to Visit  Medication Sig Dispense Refill  . Calcium Carbonate-Vit D-Min (GNP CALCIUM 1200) 1200-1000 MG-UNIT CHEW Chew 1,200 mg by mouth daily with breakfast. Take in combination with vitamin D and magnesium. (Patient not taking: Reported on 08/18/2018) 30 tablet 5  . ibuprofen (ADVIL,MOTRIN) 200 MG tablet Take 200 mg by mouth every 6 (six) hours as needed for headache or moderate pain.    . Multiple Vitamins-Minerals (WOMENS MULTIVITAMIN PO) Take 1 tablet by mouth at bedtime.    . naproxen (NAPROSYN) 500 MG tablet Take 1 tablet (500 mg total) by mouth 2 (two) times daily with a meal. (Patient not taking: Reported on 08/18/2018) 14 tablet 0  . naproxen sodium (ALEVE) 220 MG tablet  Take 220 mg by mouth daily as needed (for pain or headache).    . OVER THE COUNTER MEDICATION Take 2 tablets by mouth daily. Keto Diet Supplement    . oxyCODONE-acetaminophen (PERCOCET/ROXICET) 5-325 MG tablet Take 1 tablet by mouth every 4 (four) hours as needed for up to 4 doses for severe pain. (Patient not taking: Reported on 07/31/2018) 4 tablet 0  . traMADol (ULTRAM) 50 MG tablet Take 1 tablet (50 mg total) by mouth every 6 (six) hours as needed for severe pain. 120 tablet 0   No current facility-administered medications on file prior to visit.    Allergies: No Known Allergies  Social History:   reports that she quit smoking about 14 years ago. Her smoking use included cigarettes. She quit after 15.00 years of use. She has never used smokeless tobacco. She reports  that she drinks alcohol. She reports that she does not use drugs.  Family History  Problem Relation Age of Onset  . Diabetes Mother   . Hypertension Mother   . Heart disease Father   . Cancer Father   . Heart disease Brother     Review of Systems:  Review of Systems  Constitutional: Negative.   HENT: Negative.   Eyes: Negative.   Respiratory: Negative.   Cardiovascular: Negative.   Gastrointestinal: Negative.   Genitourinary: Negative.   Musculoskeletal: Negative.   Skin: Negative.   Neurological: Negative.   Psychiatric/Behavioral: Negative.      PHYSICAL EXAM: Blood pressure 124/78, height 5\' 3"  (1.6 m), weight 222 lb (100.7 kg). CONSTITUTIONAL: Well-developed, well-nourished female in no acute distress.  HENT:  Normocephalic, atraumatic, External right and left ear normal. Oropharynx is clear and moist EYES: Conjunctivae and EOM are normal. Pupils are equal, round, and reactive to light. No scleral icterus.  NECK: Normal range of motion, supple, no masses SKIN: Skin is warm and dry. No rash noted. Not diaphoretic. No erythema. No pallor. [place]: Alert and oriented to person, place, and time. Normal reflexes, muscle tone coordination. No cranial nerve deficit noted. PSYCHIATRIC: Normal mood and affect. Normal behavior. Normal judgment and thought content. CARDIOVASCULAR: Normal heart rate noted, regular rhythm RESPIRATORY: Effort and breath sounds normal, no problems with respiration noted ABDOMEN: Soft, nontender, nondistended. PELVIC: Deferred MUSCULOSKELETAL: Normal range of motion. No edema and no tenderness. 2+ distal pulses.  Labs: Results for orders placed or performed during the hospital encounter of 08/16/18 (from the past 336 hour(s))  CBC with Differential/Platelet   Collection Time: 08/16/18  6:03 AM  Result Value Ref Range   WBC 8.1 3.6 - 11.0 K/uL   RBC 4.68 3.80 - 5.20 MIL/uL   Hemoglobin 14.6 12.0 - 16.0 g/dL   HCT 42.0 35.0 - 47.0 %   MCV 89.6  80.0 - 100.0 fL   MCH 31.1 26.0 - 34.0 pg   MCHC 34.7 32.0 - 36.0 g/dL   RDW 13.5 11.5 - 14.5 %   Platelets 363 150 - 440 K/uL   Neutrophils Relative % 53 %   Neutro Abs 4.4 1.4 - 6.5 K/uL   Lymphocytes Relative 36 %   Lymphs Abs 2.9 1.0 - 3.6 K/uL   Monocytes Relative 6 %   Monocytes Absolute 0.5 0.2 - 0.9 K/uL   Eosinophils Relative 3 %   Eosinophils Absolute 0.2 0 - 0.7 K/uL   Basophils Relative 2 %   Basophils Absolute 0.1 0 - 0.1 K/uL  Lipase, blood   Collection Time: 08/16/18  6:03 AM  Result Value Ref  Range   Lipase 29 11 - 51 U/L  Comprehensive metabolic panel   Collection Time: 08/16/18  6:03 AM  Result Value Ref Range   Sodium 138 135 - 145 mmol/L   Potassium 3.4 (L) 3.5 - 5.1 mmol/L   Chloride 105 98 - 111 mmol/L   CO2 26 22 - 32 mmol/L   Glucose, Bld 102 (H) 70 - 99 mg/dL   BUN 10 6 - 20 mg/dL   Creatinine, Ser 0.95 0.44 - 1.00 mg/dL   Calcium 8.8 (L) 8.9 - 10.3 mg/dL   Total Protein 8.0 6.5 - 8.1 g/dL   Albumin 4.1 3.5 - 5.0 g/dL   AST 37 15 - 41 U/L   ALT 39 0 - 44 U/L   Alkaline Phosphatase 53 38 - 126 U/L   Total Bilirubin 0.5 0.3 - 1.2 mg/dL   GFR calc non Af Amer >60 >60 mL/min   GFR calc Af Amer >60 >60 mL/min   Anion gap 7 5 - 15  Urinalysis, Routine w reflex microscopic   Collection Time: 08/16/18  6:18 AM  Result Value Ref Range   Color, Urine YELLOW (A) YELLOW   APPearance CLEAR (A) CLEAR   Specific Gravity, Urine 1.019 1.005 - 1.030   pH 6.0 5.0 - 8.0   Glucose, UA NEGATIVE NEGATIVE mg/dL   Hgb urine dipstick NEGATIVE NEGATIVE   Bilirubin Urine NEGATIVE NEGATIVE   Ketones, ur NEGATIVE NEGATIVE mg/dL   Protein, ur NEGATIVE NEGATIVE mg/dL   Nitrite NEGATIVE NEGATIVE   Leukocytes, UA NEGATIVE NEGATIVE  Pregnancy, urine POC   Collection Time: 08/16/18  6:23 AM  Result Value Ref Range   Preg Test, Ur NEGATIVE NEGATIVE  Results for orders placed or performed in visit on 08/14/18 (from the past 336 hour(s))  Cytology - PAP   Collection Time:  08/15/18 12:00 AM  Result Value Ref Range   Adequacy      Satisfactory for evaluation  endocervical/transformation zone component PRESENT.   Diagnosis      NEGATIVE FOR INTRAEPITHELIAL LESIONS OR MALIGNANCY.   HPV NOT DETECTED    Material Submitted CervicoVaginal Pap [ThinPrep Imaged]    Assessment: Patient Active Problem List   Diagnosis Date Noted  . Adenomyosis 08/17/2018  . Chronic abdominal pain (RLQ) (Primary Area of Pain) (Right) 12/26/2017    Plan: Patient will undergo surgical management with the above surgery.   The risks of surgery were discussed in detail with the patient including but not limited to: bleeding which may require transfusion or reoperation; infection which may require antibiotics; injury to surrounding organs which may involve bowel, bladder, ureters ; need for additional procedures including laparoscopy or laparotomy; thromboembolic phenomenon, surgical site problems and other postoperative/anesthesia complications. Likelihood of success in alleviating the patient's condition was discussed. Routine postoperative instructions will be reviewed with the patient and her family in detail afte r surgery.  The patient concurred with the proposed plan, giving informed written consent for the surgery.  Preoperative prophylactic antibiotics, as indicated, and SCDs ordered on call to the OR.    Prentice Docker, MD 08/21/2018 1:00 PM

## 2018-08-22 ENCOUNTER — Telehealth: Payer: Self-pay

## 2018-08-22 NOTE — Telephone Encounter (Signed)
Pt works for [employer]. She received a call from her work stating they need her to come in the same day she is scheduled for surgery for them to draw her blood and check her blood pressure. She is wandering if this is ok for them to make this request? She is requesting [place] to let them know that she isn't able to make it due to surgery. She is going in tonight, but will not be able to on the day of her surgery. Pt states we should have their phone and fax #. Cb#[phone].

## 2018-08-22 NOTE — Pre-Procedure Instructions (Signed)
Called blood bank to determine if positive antibody screening (positive anti E) was of concern for preop/P.A.T.  Shelly stated that the blood bank would have 2 units on file d/t specialty blood and that they would contact patient to determine if she has EVER received a blood transfusion.

## 2018-08-23 MED ORDER — CEFAZOLIN SODIUM-DEXTROSE 2-4 GM/100ML-% IV SOLN
2.0000 g | INTRAVENOUS | Status: AC
  Administered 2018-08-24: 2 g via INTRAVENOUS

## 2018-08-23 NOTE — Telephone Encounter (Signed)
Please let me know when pt returns my call.. And also please get the fax # for her job so we can send this note . I am off Thursday and leaving at 3:30 today for an appt, if she calls while im off, please get another CMA to do this for her

## 2018-08-23 NOTE — Telephone Encounter (Signed)
I would say that we can provide a note stating that she has a surgery scheduled with me that day.  If she provides the fax number, we can send a letter to that effect.  I am not sure why they want her to come in for this that day.  However, all I can do is provide proof that she has a scheduled surgery the same day.  Please contact her and get the fax number if she would like [place] to do this.

## 2018-08-24 ENCOUNTER — Other Ambulatory Visit: Payer: Self-pay

## 2018-08-24 ENCOUNTER — Encounter: Payer: Self-pay | Admitting: *Deleted

## 2018-08-24 ENCOUNTER — Encounter
Admission: RE | Disposition: A | Discharge status: Home / Self Care | Payer: Self-pay | Source: Ambulatory Visit | Attending: Obstetrics and Gynecology

## 2018-08-24 ENCOUNTER — Ambulatory Visit: Payer: BLUE CROSS/BLUE SHIELD | Admitting: Anesthesiology

## 2018-08-24 ENCOUNTER — Ambulatory Visit
Admission: RE | Admit: 2018-08-24 | Discharge: 2018-08-24 | Disposition: A | Discharge status: Home / Self Care | Payer: BLUE CROSS/BLUE SHIELD | Source: Ambulatory Visit | Attending: Obstetrics and Gynecology | Admitting: Obstetrics and Gynecology

## 2018-08-24 DIAGNOSIS — Z87891 Personal history of nicotine dependence: Secondary | ICD-10-CM | POA: Insufficient documentation

## 2018-08-24 DIAGNOSIS — K219 Gastro-esophageal reflux disease without esophagitis: Secondary | ICD-10-CM | POA: Insufficient documentation

## 2018-08-24 DIAGNOSIS — F329 Major depressive disorder, single episode, unspecified: Secondary | ICD-10-CM | POA: Insufficient documentation

## 2018-08-24 DIAGNOSIS — N800 Endometriosis of uterus: Secondary | ICD-10-CM | POA: Diagnosis not present

## 2018-08-24 DIAGNOSIS — N8003 Adenomyosis of the uterus: Secondary | ICD-10-CM

## 2018-08-24 DIAGNOSIS — R1031 Right lower quadrant pain: Secondary | ICD-10-CM

## 2018-08-24 DIAGNOSIS — N809 Endometriosis, unspecified: Secondary | ICD-10-CM

## 2018-08-24 DIAGNOSIS — G8929 Other chronic pain: Secondary | ICD-10-CM

## 2018-08-24 DIAGNOSIS — Z79899 Other long term (current) drug therapy: Secondary | ICD-10-CM | POA: Insufficient documentation

## 2018-08-24 DIAGNOSIS — R1030 Lower abdominal pain, unspecified: Secondary | ICD-10-CM | POA: Diagnosis not present

## 2018-08-24 HISTORY — PX: CYSTOSCOPY: SHX5120

## 2018-08-24 HISTORY — PX: ROBOTIC ASSISTED TOTAL HYSTERECTOMY: SHX6085

## 2018-08-24 LAB — POCT PREGNANCY, URINE: Preg Test, Ur: NEGATIVE

## 2018-08-24 LAB — ABO/RH: ABO/RH(D): AB POS

## 2018-08-24 SURGERY — ROBOTIC ASSISTED TOTAL HYSTERECTOMY
Anesthesia: General

## 2018-08-24 MED ORDER — PROPOFOL 10 MG/ML IV BOLUS
INTRAVENOUS | Status: DC | PRN
  Administered 2018-08-24: 200 mg via INTRAVENOUS

## 2018-08-24 MED ORDER — OXYCODONE HCL 5 MG/5ML PO SOLN: 5.0000 mg | Freq: Once | ORAL | Status: DC | PRN

## 2018-08-24 MED ORDER — LIDOCAINE HCL (CARDIAC) PF 100 MG/5ML IV SOSY
PREFILLED_SYRINGE | INTRAVENOUS | Status: DC | PRN
  Administered 2018-08-24: 100 mg via INTRAVENOUS

## 2018-08-24 MED ORDER — PHENYLEPHRINE HCL 10 MG/ML IJ SOLN
INTRAMUSCULAR | Status: DC | PRN
  Administered 2018-08-24 (×4): 100 ug via INTRAVENOUS
  Administered 2018-08-24: 200 ug via INTRAVENOUS

## 2018-08-24 MED ORDER — CEFAZOLIN SODIUM-DEXTROSE 2-4 GM/100ML-% IV SOLN
INTRAVENOUS | Status: AC
  Filled 2018-08-24: qty 100

## 2018-08-24 MED ORDER — BUPIVACAINE HCL 0.5 % IJ SOLN
INTRAMUSCULAR | Status: DC | PRN
  Administered 2018-08-24: 15 mL

## 2018-08-24 MED ORDER — DEXAMETHASONE SODIUM PHOSPHATE 10 MG/ML IJ SOLN
INTRAMUSCULAR | Status: DC | PRN
  Administered 2018-08-24: 4 mg via INTRAVENOUS

## 2018-08-24 MED ORDER — EPHEDRINE SULFATE 50 MG/ML IJ SOLN
INTRAMUSCULAR | Status: AC
  Filled 2018-08-24: qty 1

## 2018-08-24 MED ORDER — FLUORESCEIN SODIUM 10 % IV SOLN
INTRAVENOUS | Status: AC
  Filled 2018-08-24: qty 5

## 2018-08-24 MED ORDER — OXYCODONE-ACETAMINOPHEN 5-325 MG PO TABS
1.0000 | ORAL_TABLET | Freq: Once | ORAL | Status: AC
  Administered 2018-08-24: 1 via ORAL

## 2018-08-24 MED ORDER — LIDOCAINE HCL (PF) 1 % IJ SOLN
INTRAMUSCULAR | Status: AC
  Administered 2018-08-24: 10:00:00
  Filled 2018-08-24: qty 2

## 2018-08-24 MED ORDER — ACETAMINOPHEN 10 MG/ML IV SOLN
INTRAVENOUS | Status: AC
  Filled 2018-08-24: qty 100

## 2018-08-24 MED ORDER — SUCCINYLCHOLINE CHLORIDE 20 MG/ML IJ SOLN
INTRAMUSCULAR | Status: DC | PRN
  Administered 2018-08-24: 120 mg via INTRAVENOUS

## 2018-08-24 MED ORDER — FENTANYL CITRATE (PF) 100 MCG/2ML IJ SOLN
INTRAMUSCULAR | Status: AC
  Filled 2018-08-24: qty 2

## 2018-08-24 MED ORDER — ONDANSETRON HCL 4 MG/2ML IJ SOLN
INTRAMUSCULAR | Status: AC
  Filled 2018-08-24: qty 2

## 2018-08-24 MED ORDER — ACETAMINOPHEN 10 MG/ML IV SOLN
INTRAVENOUS | Status: DC | PRN
  Administered 2018-08-24: 1000 mg via INTRAVENOUS

## 2018-08-24 MED ORDER — IBUPROFEN 600 MG PO TABS: 600.0000 mg | ORAL_TABLET | Freq: Four times a day (QID) | ORAL | 0 refills | Status: DC

## 2018-08-24 MED ORDER — LIDOCAINE HCL (PF) 2 % IJ SOLN
INTRAMUSCULAR | Status: AC
  Filled 2018-08-24: qty 10

## 2018-08-24 MED ORDER — SUCCINYLCHOLINE CHLORIDE 20 MG/ML IJ SOLN
INTRAMUSCULAR | Status: AC
  Filled 2018-08-24: qty 1

## 2018-08-24 MED ORDER — LACTATED RINGERS IV SOLN
INTRAVENOUS | Status: DC
  Administered 2018-08-24 (×2): via INTRAVENOUS

## 2018-08-24 MED ORDER — MIDAZOLAM HCL 2 MG/2ML IJ SOLN
INTRAMUSCULAR | Status: AC
  Filled 2018-08-24: qty 2

## 2018-08-24 MED ORDER — OXYCODONE-ACETAMINOPHEN 5-325 MG PO TABS
ORAL_TABLET | ORAL | Status: AC
  Administered 2018-08-24: 1 via ORAL
  Filled 2018-08-24: qty 1

## 2018-08-24 MED ORDER — FENTANYL CITRATE (PF) 100 MCG/2ML IJ SOLN
INTRAMUSCULAR | Status: AC
  Administered 2018-08-24: 50 ug via INTRAVENOUS
  Filled 2018-08-24: qty 2

## 2018-08-24 MED ORDER — MEPERIDINE HCL 50 MG/ML IJ SOLN: 6.2500 mg | INTRAMUSCULAR | Status: DC | PRN

## 2018-08-24 MED ORDER — KETOROLAC TROMETHAMINE 30 MG/ML IJ SOLN
INTRAMUSCULAR | Status: DC | PRN
  Administered 2018-08-24: 30 mg via INTRAVENOUS

## 2018-08-24 MED ORDER — BUPIVACAINE HCL (PF) 0.5 % IJ SOLN
INTRAMUSCULAR | Status: AC
  Filled 2018-08-24: qty 30

## 2018-08-24 MED ORDER — SUGAMMADEX SODIUM 200 MG/2ML IV SOLN
INTRAVENOUS | Status: AC
  Filled 2018-08-24: qty 2

## 2018-08-24 MED ORDER — ROCURONIUM BROMIDE 100 MG/10ML IV SOLN
INTRAVENOUS | Status: DC | PRN
  Administered 2018-08-24: 10 mg via INTRAVENOUS
  Administered 2018-08-24: 5 mg via INTRAVENOUS
  Administered 2018-08-24: 35 mg via INTRAVENOUS
  Administered 2018-08-24 (×2): 20 mg via INTRAVENOUS
  Administered 2018-08-24: 10 mg via INTRAVENOUS

## 2018-08-24 MED ORDER — ROCURONIUM BROMIDE 50 MG/5ML IV SOLN
INTRAVENOUS | Status: AC
  Filled 2018-08-24: qty 1

## 2018-08-24 MED ORDER — FAMOTIDINE 20 MG PO TABS
20.0000 mg | ORAL_TABLET | Freq: Once | ORAL | Status: AC
  Administered 2018-08-24: 20 mg via ORAL

## 2018-08-24 MED ORDER — KETOROLAC TROMETHAMINE 30 MG/ML IJ SOLN
INTRAMUSCULAR | Status: AC
  Filled 2018-08-24: qty 1

## 2018-08-24 MED ORDER — MIDAZOLAM HCL 2 MG/2ML IJ SOLN
INTRAMUSCULAR | Status: DC | PRN
  Administered 2018-08-24: 2 mg via INTRAVENOUS

## 2018-08-24 MED ORDER — ONDANSETRON 8 MG PO TBDP: 8.0000 mg | ORAL_TABLET | Freq: Three times a day (TID) | ORAL | 0 refills | Status: DC | PRN

## 2018-08-24 MED ORDER — GLYCOPYRROLATE 0.2 MG/ML IJ SOLN
INTRAMUSCULAR | Status: DC | PRN
  Administered 2018-08-24: .2 mg via INTRAVENOUS

## 2018-08-24 MED ORDER — PHENYLEPHRINE HCL 10 MG/ML IJ SOLN
INTRAMUSCULAR | Status: AC
  Filled 2018-08-24: qty 1

## 2018-08-24 MED ORDER — SODIUM CHLORIDE 0.9 % IV SOLN
INTRAVENOUS | Status: DC | PRN
  Administered 2018-08-24: 60 ug/min via INTRAVENOUS

## 2018-08-24 MED ORDER — FENTANYL CITRATE (PF) 100 MCG/2ML IJ SOLN
INTRAMUSCULAR | Status: AC
  Administered 2018-08-24: 25 ug via INTRAVENOUS
  Filled 2018-08-24: qty 2

## 2018-08-24 MED ORDER — SUGAMMADEX SODIUM 200 MG/2ML IV SOLN
INTRAVENOUS | Status: DC | PRN
  Administered 2018-08-24: 200 mg via INTRAVENOUS

## 2018-08-24 MED ORDER — SODIUM CHLORIDE 0.9 % IJ SOLN
INTRAMUSCULAR | Status: AC
  Filled 2018-08-24: qty 20

## 2018-08-24 MED ORDER — FENTANYL CITRATE (PF) 100 MCG/2ML IJ SOLN
INTRAMUSCULAR | Status: DC | PRN
  Administered 2018-08-24: 50 ug via INTRAVENOUS

## 2018-08-24 MED ORDER — OXYCODONE-ACETAMINOPHEN 5-325 MG PO TABS: 2.0000 | ORAL_TABLET | Freq: Four times a day (QID) | ORAL | 0 refills | Status: DC | PRN

## 2018-08-24 MED ORDER — VASOPRESSIN 20 UNIT/ML IV SOLN
INTRAVENOUS | Status: DC | PRN
  Administered 2018-08-24 (×2): 2 [IU] via INTRAVENOUS
  Administered 2018-08-24 (×3): 1 [IU] via INTRAVENOUS
  Administered 2018-08-24: 2 [IU] via INTRAVENOUS

## 2018-08-24 MED ORDER — DEXAMETHASONE SODIUM PHOSPHATE 10 MG/ML IJ SOLN
INTRAMUSCULAR | Status: AC
  Filled 2018-08-24: qty 1

## 2018-08-24 MED ORDER — OXYCODONE HCL 5 MG PO TABS: 5.0000 mg | ORAL_TABLET | Freq: Once | ORAL | Status: DC | PRN

## 2018-08-24 MED ORDER — EPHEDRINE SULFATE 50 MG/ML IJ SOLN
INTRAMUSCULAR | Status: DC | PRN
  Administered 2018-08-24: 5 mg via INTRAVENOUS
  Administered 2018-08-24 (×2): 10 mg via INTRAVENOUS
  Administered 2018-08-24: 5 mg via INTRAVENOUS

## 2018-08-24 MED ORDER — PROMETHAZINE HCL 25 MG/ML IJ SOLN: 6.2500 mg | INTRAMUSCULAR | Status: DC | PRN

## 2018-08-24 MED ORDER — KETAMINE HCL 10 MG/ML IJ SOLN
INTRAMUSCULAR | Status: DC | PRN
  Administered 2018-08-24: 30 mg via INTRAVENOUS
  Administered 2018-08-24 (×2): 10 mg via INTRAVENOUS

## 2018-08-24 MED ORDER — LACTATED RINGERS IV SOLN
INTRAVENOUS | Status: DC
  Administered 2018-08-24: 12:00:00 via INTRAVENOUS

## 2018-08-24 MED ORDER — FENTANYL CITRATE (PF) 100 MCG/2ML IJ SOLN
25.0000 ug | INTRAMUSCULAR | Status: DC | PRN
  Administered 2018-08-24: 50 ug via INTRAVENOUS
  Administered 2018-08-24 (×4): 25 ug via INTRAVENOUS

## 2018-08-24 MED ORDER — FAMOTIDINE 20 MG PO TABS
ORAL_TABLET | ORAL | Status: AC
  Administered 2018-08-24: 20 mg via ORAL
  Filled 2018-08-24: qty 1

## 2018-08-24 MED ORDER — ONDANSETRON HCL 4 MG/2ML IJ SOLN
INTRAMUSCULAR | Status: DC | PRN
  Administered 2018-08-24: 4 mg via INTRAVENOUS

## 2018-08-24 MED ORDER — FLUORESCEIN SODIUM 10 % IV SOLN
INTRAVENOUS | Status: DC | PRN
  Administered 2018-08-24: .25 mL via INTRAVENOUS

## 2018-08-24 SURGICAL SUPPLY — 79 items
BAG URINE DRAINAGE (UROLOGICAL SUPPLIES) ×4 IMPLANT
BLADE SURG SZ10 CARB STEEL (BLADE) ×4 IMPLANT
BLADE SURG SZ11 CARB STEEL (BLADE) ×4 IMPLANT
CANISTER SUCT 1200ML W/VALVE (MISCELLANEOUS) ×4 IMPLANT
CATH FOLEY 2WAY  5CC 16FR (CATHETERS) ×2
CATH ROBINSON RED A/P 16FR (CATHETERS) ×4 IMPLANT
CATH URTH 16FR FL 2W BLN LF (CATHETERS) ×2 IMPLANT
CHLORAPREP W/TINT 26ML (MISCELLANEOUS) ×4 IMPLANT
CORD BIP STRL DISP 12FT (MISCELLANEOUS) ×4 IMPLANT
COVER MAYO STAND STRL (DRAPES) ×4 IMPLANT
COVER TIP SHEARS 8 DVNC (MISCELLANEOUS) ×2 IMPLANT
COVER TIP SHEARS 8MM DA VINCI (MISCELLANEOUS) ×2
CRADLE LAMINECT ARM (MISCELLANEOUS) ×4 IMPLANT
DEFOGGER SCOPE WARMER CLEARIFY (MISCELLANEOUS) ×4 IMPLANT
DERMABOND ADVANCED (GAUZE/BANDAGES/DRESSINGS) ×2
DERMABOND ADVANCED .7 DNX12 (GAUZE/BANDAGES/DRESSINGS) ×2 IMPLANT
DRAPE 3 ARM ACCESS DA VINCI (DRAPES) ×2
DRAPE 3 ARM ACCESS DVNC (DRAPES) ×2 IMPLANT
DRAPE LEGGINS SURG 28X43 STRL (DRAPES) ×4 IMPLANT
DRAPE SHEET LG 3/4 BI-LAMINATE (DRAPES) ×8 IMPLANT
DRAPE TABLE BACK 80X90 (DRAPES) ×4 IMPLANT
DRAPE UNDER BUTTOCK W/FLU (DRAPES) ×4 IMPLANT
ELECT REM PT RETURN 9FT ADLT (ELECTROSURGICAL) ×4
ELECTRODE REM PT RTRN 9FT ADLT (ELECTROSURGICAL) ×2 IMPLANT
FILTER LAP SMOKE EVAC STRL (MISCELLANEOUS) IMPLANT
GLOVE BIO SURGEON STRL SZ7 (GLOVE) ×20 IMPLANT
GLOVE BIOGEL PI IND STRL 7.5 (GLOVE) ×10 IMPLANT
GLOVE BIOGEL PI INDICATOR 7.5 (GLOVE) ×10
GOWN STRL REUS W/ TWL LRG LVL3 (GOWN DISPOSABLE) ×12 IMPLANT
GOWN STRL REUS W/ TWL XL LVL3 (GOWN DISPOSABLE) ×2 IMPLANT
GOWN STRL REUS W/TWL LRG LVL3 (GOWN DISPOSABLE) ×12
GOWN STRL REUS W/TWL XL LVL3 (GOWN DISPOSABLE) ×2
GRASPER SUT TROCAR 14GX15 (MISCELLANEOUS) IMPLANT
IRRIGATION STRYKERFLOW (MISCELLANEOUS) ×2 IMPLANT
IRRIGATOR STRYKERFLOW (MISCELLANEOUS) ×4
IV LACTATED RINGERS 1000ML (IV SOLUTION) ×4 IMPLANT
IV NS 1000ML (IV SOLUTION) ×4
IV NS 1000ML BAXH (IV SOLUTION) ×4 IMPLANT
KIT PINK PAD W/HEAD ARE REST (MISCELLANEOUS) ×4
KIT PINK PAD W/HEAD ARM REST (MISCELLANEOUS) ×2 IMPLANT
KIT TURNOVER CYSTO (KITS) ×4 IMPLANT
LABEL OR SOLS (LABEL) ×4 IMPLANT
LIGASURE VESSEL 5MM BLUNT TIP (ELECTROSURGICAL) ×4 IMPLANT
MANIPULATOR VCARE LG CRV RETR (MISCELLANEOUS) ×4 IMPLANT
MANIPULATOR VCARE STD CRV RETR (MISCELLANEOUS) IMPLANT
NEEDLE HYPO 22GX1.5 SAFETY (NEEDLE) ×4 IMPLANT
NS IRRIG 1000ML POUR BTL (IV SOLUTION) ×8 IMPLANT
OCCLUDER COLPOPNEUMO (BALLOONS) ×4 IMPLANT
PACK LAP CHOLECYSTECTOMY (MISCELLANEOUS) ×4 IMPLANT
PAD OB MATERNITY 4.3X12.25 (PERSONAL CARE ITEMS) ×4 IMPLANT
PAD PREP 24X41 OB/GYN DISP (PERSONAL CARE ITEMS) ×4 IMPLANT
SCISSORS METZENBAUM CVD 33 (INSTRUMENTS) IMPLANT
SET CYSTO W/LG BORE CLAMP LF (SET/KITS/TRAYS/PACK) ×4 IMPLANT
SOLUTION ELECTROLUBE (MISCELLANEOUS) ×4 IMPLANT
SPONGE LAP 18X18 RF (DISPOSABLE) ×4 IMPLANT
SPONGE XRAY 4X4 16PLY STRL (MISCELLANEOUS) ×4 IMPLANT
SURGILUBE 2OZ TUBE FLIPTOP (MISCELLANEOUS) ×4 IMPLANT
SUT DVC VLOC 180 0 12IN GS21 (SUTURE) ×4
SUT MNCRL 4-0 (SUTURE) ×4
SUT MNCRL 4-0 27XMFL (SUTURE) ×4
SUT VIC AB 0 CT2 27 (SUTURE) ×4 IMPLANT
SUT VIC AB 1 CT1 36 (SUTURE) IMPLANT
SUT VIC AB 2-0 CT1 27 (SUTURE)
SUT VIC AB 2-0 CT1 TAPERPNT 27 (SUTURE) IMPLANT
SUT VIC AB 4-0 SH 27 (SUTURE)
SUT VIC AB 4-0 SH 27XANBCTRL (SUTURE) IMPLANT
SUT VICRYL 0 AB UR-6 (SUTURE) IMPLANT
SUTURE DVC VLC 180 0 12IN GS21 (SUTURE) ×2 IMPLANT
SUTURE MNCRL 4-0 27XMF (SUTURE) ×4 IMPLANT
SYR 10ML LL (SYRINGE) ×4 IMPLANT
SYR 50ML LL SCALE MARK (SYRINGE) ×4 IMPLANT
SYSTEM WECK SHIELD CLOSURE (TROCAR) ×4 IMPLANT
TROCAR 12M 150ML BLUNT (TROCAR) IMPLANT
TROCAR DISP BLADELESS 8 DVNC (TROCAR) ×2 IMPLANT
TROCAR DISP BLADELESS 8MM (TROCAR) ×2
TROCAR ENDO BLADELESS 11MM (ENDOMECHANICALS) ×4 IMPLANT
TROCAR XCEL 12X100 BLDLESS (ENDOMECHANICALS) ×4 IMPLANT
TROCAR XCEL NON-BLD 5MMX100MML (ENDOMECHANICALS) ×4 IMPLANT
TUBING INSUF HEATED (TUBING) ×4 IMPLANT

## 2018-08-24 NOTE — Op Note (Signed)
Operative Note   Date 08/24/2018  Time 2:46 PM   PRE-OP DIAGNOSIS:  1) Adenomyosis 2) chronic pelvic pain   POST-OP DIAGNOSIS:  1) Adenomyosis 2) chronic pelvic pain  PRIMARY SURGEON: Prentice Docker, MD   ASSISTANT SURGEON: Malachy Mood, MD, No other capable assistant available, in surgery requiring high level assistant.  ANESTHESIA: General endotracheal  PROCEDURE:  1) Robot assisted total laparoscopic hysterectomy, bilateral salpingectomy 2) Cystoscopy  ESTIMATED BLOOD LOSS: 300 mL  DRAINS: None  TOTAL IV FLUIDS: 1,700 mL crystalloid  SPECIMENS:  1) Uterus with cervix 2) segment of bilateral fallopian tubes  COMPLICATIONS: None  DISPOSITION: PACU  CONDITION: Stable  FINDINGS: Exam under anesthesia revealed a mobile anteverted uterus. There were no adnexal masses or nodularity. The cervix was negative for gross lesions. Intraoperative findings included: The adnexa were normal bilaterally with the exception of the epiploica of the appendix was adherent to the right fallopian tube.  The omentum was adherent to the anterior abdominal wall.   PROCEDURE IN DETAIL: After informed consent was obtained, the patient was taken to the operating room where anesthesia was obtained without difficulty. The patient was positioned in the dorsal lithotomy position in [place] and her arms were carefully tucked at her sides and the usual precautions were taken.  She was prepped and draped in normal sterile fashion.  Time-out was performed and a Foley catheter was placed into the bladder and a standard VCare uterine manipulator was then placed in the uterus without incident.    Attention was turned to the abdomen where after injection of local anesthetic, a 5 mm supraumbilical incision was made with the scalpel approximately 4 cm superior to the umbilicus. Entry into the abdomen was obtained via Optiview trocar technique (a blunt entry technique with camera visualization through  the obturator upon entry). Verification of entry into the abdomen was obtained using opening pressures. The abdomen was insufflated with CO2. The camera was introduced through the trocar with verification of atraumatic entry.  Two robotic trochars and one 5mm assist trochar were inserted atraumatically under visualization.  The 5 mm supra umbilical port was replaced with a 12 mm trocar.  Prior to docking the robot the omentum was noted to be adherent to the anterior abdominal wall.  Lysis of omental adhesions were undertaken to remove this adherence.  A LigaSure device was employed to accomplish this task.  The patient was placed in steep trendelenburg, and the daVinci robot was docked and a bipolar fenestrated and monopolar scissors were employed.  Attention was returned to the left fallopian tube which was mildly adherent to the ovary.  The fallopian tube was carefully dissected from the ovary, elevated, and using bipolar electrocautery the segment of fallopian tube was removed and taken from the body cavity through the assistant port.  The left round ligament was cauterized and transected and the left utero-ovarian ligament was cauterized and transected without difficulty.  The anterior broad ligament was dissected to the level of the internal cervical os and a bladder flap was created.  The posterior broad ligament was dissected down to the level of the posterior internal cervical os.  The left uterine artery was skeletonized, cauterized, and transected.  A similar procedure was undertaken on the right adnexa.  However, the appendix was noted to be adherent to the right fallopian tube.  Upon closer inspection, the epiploica of the appendix was the portion that was adherent.  This was carefully dissected from the fallopian tube and the fallopian tube was  freed from the appendix and its epiploica.  Of note, a general surgeon was called into the OR to inspect the appendix.  Dr. Windell Moment inspected the  appendix and noted no defects or injury to the appendix.  The right fallopian tube was freed from the right mesosalpinx and removed through the assistant port.  The right round ligament was cauterized and transected.  The right utero-ovarian ligament was cauterized and transected.  Similarly the broad ligament was dissected to the level of the internal cervical os both anteriorly and posteriorly.  The right uterine artery was skeletonized, cauterized, and transected.  The colpotomy was performed and the cervix was freed from the vagina in a circumferential fashion following the [place].  The uterus was removed through the vagina intact.  At this point a bleeding artery was noted on the left apex of the vaginal cuff.  With some difficulty the bleeding was stopped using electrocautery.  However, the bulk of blood loss for this procedure occurred during this time as the artery was difficult to grasp and cauterize.  However, upon cauterization continued hemostasis was verified throughout the rest of the entire case.  The vaginal cuff was closed using the V lock suture in a running fashion.  A gloved hand was placed in the vagina to ensure adequate closure.  The pelvis was copiously irrigated and suctioned.  The abdominal pressure was lowered to 5 mmHg and continued hemostasis was noted.  The robot was undocked from the patient.  Cystoscopy was performed at this point after removal of the Foley catheter.  The bladder appeared intact and after administration of fluorescein dye IV, verification of efflux from the bilateral ureteral orifices was noted.  The cystoscope was removed and the Foley catheter was replaced.  The camera port and assistant port trocars were removed both were closed using a single 0 Vicryl stitch to reapproximate the fascia.  The abdomen was emptied of CO2 with the help of 5 deep breaths from anesthesia.  All trochars were removed.  All skin incision sites were closed using 4-0 Monocryl in a  subcuticular fashion.  All skin incision sites were then closed using surgical skin glue.  The vagina was swept to ensure that it was free from instruments, sponges, or any other surgical implement.  The Foley catheter was removed.  The patient tolerated the procedure well.  Sponge, laps, and needle counts were correct x2.  For VTE prophylaxis the patient was wearing SCDs throughout the entire procedure.  For antibiotic prophylaxis she received Ancef 2 g IV prior to the start of the procedure.  She was awakened in the operating room and taken to the recovery area in stable condition.  Prentice Docker, MD, [facility], [facility] 08/24/2018 3:06 PM

## 2018-08-24 NOTE — Anesthesia Preprocedure Evaluation (Signed)
Anesthesia Evaluation  Patient identified by MRN, date of birth, ID band Patient awake    Reviewed: Allergy & Precautions, NPO status , Patient's Chart, lab work & pertinent test results  History of Anesthesia Complications (+) history of anesthetic complications (pt reports problems with anesthesia in the past but cannot report what they were, last 2 anesthetic records reviewed were unremarkable, GA with volatile anesthetic for both cases, airway documentation does not indicate difficult intubations)  Airway Mallampati: IV  TM Distance: >3 FB Neck ROM: Full    Dental no notable dental hx.    Pulmonary neg sleep apnea, neg COPD, former smoker,    breath sounds clear to auscultation- rhonchi (-) wheezing      Cardiovascular Exercise Tolerance: Good (-) hypertension(-) CAD, (-) Past MI, (-) Cardiac Stents and (-) CABG  Rhythm:Regular Rate:Normal - Systolic murmurs and - Diastolic murmurs    Neuro/Psych  Headaches, PSYCHIATRIC DISORDERS Depression    GI/Hepatic Neg liver ROS, GERD  ,  Endo/Other  neg diabetesMorbid obesity  Renal/GU negative Renal ROS     Musculoskeletal negative musculoskeletal ROS (+)   Abdominal (+) + obese,   Peds  Hematology negative hematology ROS (+)   Anesthesia Other Findings Past Medical History: No date: Complication of anesthesia No date: Depression No date: Endometriosis No date: GERD (gastroesophageal reflux disease)     Comment:  NO MEDS No date: Headache     Comment:  FREQUENTLY No date: Ovarian cyst   Reproductive/Obstetrics                             Anesthesia Physical Anesthesia Plan  ASA: II  Anesthesia Plan: General   Post-op Pain Management:    Induction: Intravenous  PONV Risk Score and Plan: 2 and Ondansetron, Dexamethasone and Midazolam  Airway Management Planned: Oral ETT  Additional Equipment:   Intra-op Plan:   Post-operative  Plan: Extubation in OR  Informed Consent: I have reviewed the patients History and Physical, chart, labs and discussed the procedure including the risks, benefits and alternatives for the proposed anesthesia with the patient or authorized representative who has indicated his/her understanding and acceptance.   Dental advisory given  Plan Discussed with: CRNA and Anesthesiologist  Anesthesia Plan Comments:         Anesthesia Quick Evaluation

## 2018-08-24 NOTE — Anesthesia Post-op Follow-up Note (Signed)
Anesthesia QCDR form completed.        

## 2018-08-24 NOTE — OR Nursing (Signed)
Discharge instructions discussed with pt and husband. Both voice understanding. 

## 2018-08-24 NOTE — Transfer of Care (Signed)
Immediate Anesthesia Transfer of Care Note  Patient: Sherry Watkins  Procedure(s) Performed: ROBOTIC ASSISTED TOTAL HYSTERECTOMY BILATERAL SALPINGECTOMY (Bilateral ) CYSTOSCOPY (N/A )  Patient Location: PACU  Anesthesia Type:General  Level of Consciousness: awake  Airway & Oxygen Therapy: Patient Spontanous Breathing  Post-op Assessment: Post -op Vital signs reviewed and stable  Post vital signs: stable  Last Vitals:  Vitals Value Taken Time  BP 106/71 08/24/2018  3:00 PM  Temp    Pulse 135 08/24/2018  3:00 PM  Resp 47 08/24/2018  3:00 PM  SpO2 100 % 08/24/2018  3:00 PM  Vitals shown include unvalidated device data.  Last Pain:  Vitals:   08/24/18 0953  TempSrc: Tympanic  PainSc: 10-Worst pain ever         Complications: No apparent anesthesia complications

## 2018-08-24 NOTE — Discharge Instructions (Signed)

## 2018-08-24 NOTE — Interval H&P Note (Signed)
History and Physical Interval Note:  08/24/2018 10:43 AM  Sherry Watkins  has presented today for surgery, with the diagnosis of ADENOMYOSIS,CHRONIC LOWER ABDOMINAL PAIN  The various methods of treatment have been discussed with the patient and family. After consideration of risks, benefits and other options for treatment, the patient has consented to  Procedure(s): ROBOTIC ASSISTED TOTAL HYSTERECTOMY BILATERAL SALPINGECTOMY (Bilateral) CYSTOSCOPY (N/A), possible need for laparotomy as a surgical intervention .  The patient's history has been reviewed, patient examined, no change in status, stable for surgery.  I have reviewed the patient's chart and labs.  Questions were answered to the patient's satisfaction.  Consent reviewed with patient and she agrees to proceed.   Prentice Docker, MD, [facility], [facility] 08/24/2018 10:44 AM

## 2018-08-24 NOTE — Anesthesia Procedure Notes (Signed)
Procedure Name: Intubation Performed by: Launa Grill, RN Pre-anesthesia Checklist: Patient identified, Patient being monitored, Timeout performed, Emergency Drugs available and Suction available Patient Re-evaluated:Patient Re-evaluated prior to induction Oxygen Delivery Method: Circle system utilized Preoxygenation: Pre-oxygenation with 100% oxygen Induction Type: IV induction Ventilation: Mask ventilation without difficulty Laryngoscope Size: Miller and 2 Grade View: Grade II Tube type: Oral Tube size: 7.0 mm Number of attempts: 1 Airway Equipment and Method: Stylet Placement Confirmation: ETT inserted through vocal cords under direct vision,  positive ETCO2 and breath sounds checked- equal and bilateral Secured at: 21 cm Tube secured with: Tape Dental Injury: Teeth and Oropharynx as per pre-operative assessment

## 2018-08-25 ENCOUNTER — Encounter: Payer: Self-pay | Admitting: Obstetrics and Gynecology

## 2018-08-25 ENCOUNTER — Telehealth: Payer: Self-pay | Admitting: Obstetrics and Gynecology

## 2018-08-25 LAB — TYPE AND SCREEN
ABO/RH(D): AB POS
Antibody Screen: POSITIVE
PT AG Type: POSITIVE
Unit division: 0
Unit division: 0

## 2018-08-25 LAB — BPAM RBC
Blood Product Expiration Date: 201910222359
Blood Product Expiration Date: 201910222359
Unit Type and Rh: 5100
Unit Type and Rh: 5100

## 2018-08-25 NOTE — Telephone Encounter (Signed)
Spoke with patient for post-op follow up. She is doing well this morning. She is a little sore. She has ambulated and voided. She has no nausea and has tolerated PO. She has not yet had a bowel movement, which I described as normal.  I did recommend a stool softener, especially if she is needing to take the pain medication.   Precautions reviewed.  Pt to follow up in one week or sooner, if needed.

## 2018-08-28 ENCOUNTER — Telehealth: Payer: Self-pay

## 2018-08-28 NOTE — Telephone Encounter (Signed)
Pt inquiring if she can take a shower? [phone]

## 2018-08-28 NOTE — Telephone Encounter (Signed)
Pt advised ok to shower. Keep incisions covered & dry as much as possible. Pt states she still hasn't had a bowel movement. She is using the stool softeners. I advised to try fresh fruit, fruit juice. Can try glycerin suppository. Pt states she is bloated & hurting. Advised would send message to provider for any further suggestions. Notified SDJ not in office today.

## 2018-08-28 NOTE — Telephone Encounter (Signed)
Would you want to RX a stool softener to help with this issue?

## 2018-08-28 NOTE — Telephone Encounter (Signed)
Please let me know when pt returns my call.

## 2018-08-28 NOTE — Telephone Encounter (Signed)
Patient may use OTC suppositories or may use Miralax. I wouldn't use both. She may also try coffee (if she drinks it).  Also, I would check with her to see if she is passing gas.

## 2018-08-28 NOTE — Telephone Encounter (Signed)
Pt returning call

## 2018-08-29 NOTE — Telephone Encounter (Signed)
Please let me know when pt returns my call. Or if she calls triage , please call her back and relay SDJ message to her. Thank you.

## 2018-08-30 ENCOUNTER — Telehealth: Payer: Self-pay

## 2018-08-30 NOTE — Anesthesia Postprocedure Evaluation (Signed)
Anesthesia Post Note  Patient: Sherry Watkins  Procedure(s) Performed: ROBOTIC ASSISTED TOTAL HYSTERECTOMY BILATERAL SALPINGECTOMY (Bilateral ) CYSTOSCOPY (N/A )  Patient location during evaluation: PACU Anesthesia Type: General Level of consciousness: awake and alert Pain management: pain level controlled Vital Signs Assessment: post-procedure vital signs reviewed and stable Respiratory status: spontaneous breathing, nonlabored ventilation, respiratory function stable and patient connected to nasal cannula oxygen Cardiovascular status: blood pressure returned to baseline and stable Postop Assessment: no apparent nausea or vomiting Anesthetic complications: no     Last Vitals:  Vitals:   08/24/18 1626 08/24/18 1715  BP: 112/60 110/63  Pulse: 96 (!) 106  Resp: 16   Temp: 36.5 C   SpO2: 100% 99%    Last Pain:  Vitals:   08/24/18 1715  TempSrc:   PainSc: 3                  Bishoy Cupp Harvie Heck

## 2018-08-30 NOTE — Telephone Encounter (Signed)
Pt returning call

## 2018-08-30 NOTE — Telephone Encounter (Signed)
Please let me know when pt returns my call.

## 2018-08-30 NOTE — Telephone Encounter (Signed)
Trying to call pt again. Please let me or SDJ know when she calls back or offer an appt since she is post op

## 2018-08-30 NOTE — Telephone Encounter (Signed)
Duplicate message. 

## 2018-08-30 NOTE — Telephone Encounter (Signed)
Pt states she is in real bad pain. Her stomach is hurting real bad & is real bad & burning. [phone]

## 2018-09-01 ENCOUNTER — Telehealth: Payer: Self-pay

## 2018-09-01 ENCOUNTER — Ambulatory Visit (INDEPENDENT_AMBULATORY_CARE_PROVIDER_SITE_OTHER): Payer: BLUE CROSS/BLUE SHIELD | Admitting: Obstetrics and Gynecology

## 2018-09-01 ENCOUNTER — Encounter: Payer: Self-pay | Admitting: Obstetrics and Gynecology

## 2018-09-01 VITALS — BP 124/78 | Ht 62.0 in

## 2018-09-01 DIAGNOSIS — Z9889 Other specified postprocedural states: Secondary | ICD-10-CM

## 2018-09-01 DIAGNOSIS — N8003 Adenomyosis of the uterus: Secondary | ICD-10-CM

## 2018-09-01 DIAGNOSIS — Z09 Encounter for follow-up examination after completed treatment for conditions other than malignant neoplasm: Secondary | ICD-10-CM

## 2018-09-01 DIAGNOSIS — N809 Endometriosis, unspecified: Secondary | ICD-10-CM

## 2018-09-01 NOTE — Progress Notes (Signed)
   Postoperative Follow-up Patient presents post op from TLH/BS/Cystoscopy 1 weeks ago for pelvic pain and adenomyosis.  Subjective: Patient reports marked improvement in her preop symptoms. Eating a regular diet without difficulty. pain is well-controlled with pain medications  Activity: increasing appropriately.  Objective: Vitals:   09/01/18 1415  BP: 124/78   Vital Signs: BP 124/78   Ht 5\' 2"  (1.575 m)   BMI 40.31 kg/m  Constitutional: Well nourished, well developed female in no acute distress.  HEENT: normal Skin: Warm and dry.  Extremity: no edema  Abdomen: Soft, non-tender, normal bowel sounds; no bruits, organomegaly or masses. clean, dry, intact and no erythema, induration, warmth, or tenderness at any of the incision sites  Assessment: 37 y.o. s/p Robot assisted TLH/BS/Cysto progressing well  Plan: Patient has done well after surgery with no apparent complications.  I have discussed the post-operative course to date, and the expected progress moving forward.  The patient understands what complications to be concerned about.  I will see the patient in routine follow up, or sooner if needed.    Activity plan: increase slowly  Follow up in 2 weeks for return to work clearance for 11/4 with some limitations.   Prentice Docker 09/01/2018, 2:30 PM

## 2018-09-01 NOTE — Telephone Encounter (Signed)
Pt inquiring if she can shower w/o bandaids. She is requesting more ibuprofen & oxycodine (not much, just some to get her by) [phone]

## 2018-09-01 NOTE — Telephone Encounter (Signed)
Pt here today for post op . Will discuss then. She is aware I have called her

## 2018-09-01 NOTE — Telephone Encounter (Signed)
She may shower without bandaids. No more medication prescriptions for her.   Prentice Docker, MD

## 2018-09-04 NOTE — Telephone Encounter (Signed)
Please let me know if pt calls back or relay SDJ message to pt

## 2018-09-04 NOTE — Telephone Encounter (Signed)
Tried calling pt this AM with no answer. Please relay message when/if she calls back

## 2018-09-05 LAB — SURGICAL PATHOLOGY

## 2018-09-13 ENCOUNTER — Encounter: Payer: Self-pay | Admitting: Obstetrics and Gynecology

## 2018-09-13 ENCOUNTER — Ambulatory Visit (INDEPENDENT_AMBULATORY_CARE_PROVIDER_SITE_OTHER): Payer: BLUE CROSS/BLUE SHIELD | Admitting: Obstetrics and Gynecology

## 2018-09-13 VITALS — BP 122/78 | Ht 62.0 in | Wt 222.0 lb

## 2018-09-13 DIAGNOSIS — Z09 Encounter for follow-up examination after completed treatment for conditions other than malignant neoplasm: Secondary | ICD-10-CM

## 2018-09-13 NOTE — Progress Notes (Signed)
   Postoperative Follow-up Patient presents post op from TLH/BS/Cystoscopy 3 weeks ago for pelvic pain and adenomyosis.  Subjective: Patient reports marked improvement in her preop symptoms. Eating a regular diet without difficulty. pain is well-controlled with pain medications  Activity: increasing appropriately. She would like to return to work.  Objective: Vitals:   09/13/18 1434  BP: 122/78   Vital Signs: BP 122/78   Ht 5\' 2"  (1.575 m)   Wt 222 lb (100.7 kg)   BMI 40.60 kg/m  Constitutional: Well nourished, well developed female in no acute distress.  HEENT: normal Skin: Warm and dry.  Extremity: no edema  Abdomen: Soft, non-tender, normal bowel sounds; no bruits, organomegaly or masses. clean, dry, intact and no erythema, induration, warmth, or tenderness at any of the incision sites  Assessment: 37 y.o. s/p Robot assisted TLH/BS/Cysto progressing well  Plan: Patient has done well after surgery with no apparent complications.  I have discussed the post-operative course to date, and the expected progress moving forward.  The patient understands what complications to be concerned about.  I will see the patient in routine follow up, or sooner if needed.    Activity plan: increase slowly  Letter provided to return to work with clearance for 11/4 with some limitations (no lifting > 25 pounds)  Return in about 3 weeks (around 10/04/2018) for Six week post op visit.   Prentice Docker, MD 09/13/2018, 3:23 PM

## 2018-09-25 ENCOUNTER — Telehealth: Payer: Self-pay

## 2018-09-25 NOTE — Telephone Encounter (Signed)
Pt needs letter sent to her job stating that she's going in hospital tomorrow night 11/5.  Please call.  [phone]

## 2018-09-25 NOTE — Telephone Encounter (Signed)
Please let me know when pt callsback ?

## 2018-09-27 NOTE — Telephone Encounter (Signed)
Pt never returned my call. Please let me know if she ends up calling back

## 2018-10-02 ENCOUNTER — Encounter: Payer: Self-pay | Admitting: Obstetrics and Gynecology

## 2018-10-02 ENCOUNTER — Ambulatory Visit (INDEPENDENT_AMBULATORY_CARE_PROVIDER_SITE_OTHER): Payer: BLUE CROSS/BLUE SHIELD | Admitting: Obstetrics and Gynecology

## 2018-10-02 VITALS — BP 122/74 | Ht 62.0 in | Wt 224.0 lb

## 2018-10-02 DIAGNOSIS — Z09 Encounter for follow-up examination after completed treatment for conditions other than malignant neoplasm: Secondary | ICD-10-CM

## 2018-10-02 NOTE — Progress Notes (Signed)
   Postoperative Follow-up Patient presents post op from TLH/BS/Cystoscopy 6 weeks ago for pelvic pain and adenomyosis.  Subjective: Patient reports marked improvement in her preop symptoms. Eating a regular diet without difficulty. pain is well-controlled with pain medications  Activity: increasing appropriately. She would like to return to work.  Objective: Vitals:   10/02/18 1538  BP: 122/74   Vital Signs: BP 122/74   Ht 5\' 2"  (1.575 m)   Wt 224 lb (101.6 kg)   BMI 40.97 kg/m  Constitutional: Well nourished, well developed female in no acute distress.  HEENT: normal Skin: Warm and dry.  Extremity: no edema  Abdomen: Soft, non-tender, normal bowel sounds; no bruits, organomegaly or masses. clean, dry, intact and no erythema, induration, warmth, or tenderness at any of the incision sites  Pelvic:  NEFG: Vaginal: pink, normally rugated. Vaginal cuff: intact without defects. No bleeding or tenderness to palpation on bimanual.   Assessment: 37 y.o. s/p Robot assisted TLH/BS/Cysto progressing well  Plan: Patient has done well after surgery with no apparent complications.  I have discussed the post-operative course to date, and the expected progress moving forward.  The patient understands what complications to be concerned about.  I will see the patient in routine follow up, or sooner if needed.    Activity plan: increase slowly    Return in about 1 year (around 10/03/2019) for Annual Gynecologic Examination.   Prentice Docker, MD 10/02/2018, 4:19 PM

## 2018-10-03 ENCOUNTER — Ambulatory Visit: Payer: BLUE CROSS/BLUE SHIELD | Admitting: Obstetrics and Gynecology

## 2018-10-17 ENCOUNTER — Encounter: Payer: Self-pay | Admitting: Pain Medicine

## 2018-10-17 DIAGNOSIS — N809 Endometriosis, unspecified: Secondary | ICD-10-CM | POA: Insufficient documentation

## 2018-12-21 ENCOUNTER — Other Ambulatory Visit: Payer: Self-pay

## 2018-12-21 ENCOUNTER — Emergency Department
Admission: EM | Admit: 2018-12-21 | Discharge: 2018-12-21 | Disposition: A | Discharge status: Home / Self Care | Payer: BLUE CROSS/BLUE SHIELD | Attending: Emergency Medicine | Admitting: Emergency Medicine

## 2018-12-21 ENCOUNTER — Emergency Department: Payer: BLUE CROSS/BLUE SHIELD

## 2018-12-21 DIAGNOSIS — Z87891 Personal history of nicotine dependence: Secondary | ICD-10-CM | POA: Diagnosis not present

## 2018-12-21 DIAGNOSIS — M25571 Pain in right ankle and joints of right foot: Secondary | ICD-10-CM | POA: Insufficient documentation

## 2018-12-21 MED ORDER — NAPROXEN 500 MG PO TABS: 500.0000 mg | ORAL_TABLET | Freq: Two times a day (BID) | ORAL | Status: DC

## 2018-12-21 MED ORDER — NAPROXEN 500 MG PO TABS
500.0000 mg | ORAL_TABLET | Freq: Once | ORAL | Status: AC
  Administered 2018-12-21: 500 mg via ORAL
  Filled 2018-12-21: qty 1

## 2018-12-21 NOTE — ED Triage Notes (Signed)
Thinks she sprained her ankle

## 2018-12-21 NOTE — ED Triage Notes (Signed)
R ankle pain x 4 weeks. Denies injury. Ambulatory.

## 2018-12-21 NOTE — ED Notes (Signed)
Pt signed signature pad but signature didn't transfer over and computer in room froze. Computer had to be restarted but by that time pt had ambulated to lobby.

## 2018-12-21 NOTE — ED Provider Notes (Signed)
[facility] Emergency Department Provider Note   ____________________________________________   First MD Initiated Contact with Patient 12/21/18 [phone]     (approximate)  I have reviewed the triage vital signs and the nursing notes.   HISTORY  Chief Complaint Ankle Pain    HPI Sherry Watkins is a 38 y.o. female patient presents for right ankle pain x4 weeks.  Denies significant provocative incident.  Patient believes she might of sprained ankle.  Patient said her work requires prolonged standing.   Patient rates the pain as 8/10.  Patient described the pain as "achy".  No palliative measure for complaint.   Past Medical History:  Diagnosis Date  . Complication of anesthesia   . Depression   . Endometriosis   . GERD (gastroesophageal reflux disease)    NO MEDS  . Headache    FREQUENTLY  . Ovarian cyst     Patient Active Problem List   Diagnosis Date Noted  . Endometriosis 10/17/2018  . Adenomyosis 08/17/2018  . Chronic low back pain (Right) without sciatica 02/09/2018  . Elevated sed rate 02/01/2018  . Vitamin D deficiency 02/01/2018  . Chronic sacroiliac joint pain (Right) 02/01/2018  . Other specified dorsopathies, sacral and sacrococcygeal region 02/01/2018  . Chronic abdominal pain (RLQ) (Primary Area of Pain) (Right) 12/26/2017  . Chronic lower extremity pain (Secondary Area of Pain) (Right) 12/26/2017  . Chronic low back pain with right-sided sciatica [facility] Area of Pain) (Bilateral) (R>L) 12/26/2017  . Chronic pain syndrome 12/26/2017  . Opiate use 12/26/2017  . Problems influencing health status 12/26/2017  . Pharmacologic therapy 12/26/2017  . Disorder of skeletal system 12/26/2017    Past Surgical History:  Procedure Laterality Date  . ABLATION    . CESAREAN SECTION    . CYSTOSCOPY N/A 08/24/2018   Procedure: CYSTOSCOPY;  Surgeon: Will Bonnet, MD;  Location: [facility] ORS;  Service: Gynecology;  Laterality: N/A;  .  HYSTEROSCOPY WITH NOVASURE N/A 05/27/2015   Procedure: HYSTEROSCOPY WITH NOVASURE;  Surgeon: Aletha Halim, MD;  Location: [facility] ORS;  Service: Gynecology;  Laterality: N/A;  . LAPAROSCOPY N/A 07/07/2017   Procedure: LAPAROSCOPY DIAGNOSTIC;  Surgeon: Malachy Mood, MD;  Location: [facility] ORS;  Service: Gynecology;  Laterality: N/A;  . ROBOTIC ASSISTED TOTAL HYSTERECTOMY Bilateral 08/24/2018   Procedure: ROBOTIC ASSISTED TOTAL HYSTERECTOMY BILATERAL SALPINGECTOMY;  Surgeon: Will Bonnet, MD;  Location: [facility] ORS;  Service: Gynecology;  Laterality: Bilateral;  . TUBAL LIGATION      Prior to Admission medications   Medication Sig Start Date End Date Taking? Authorizing Provider  ibuprofen (ADVIL,MOTRIN) 600 MG tablet Take 1 tablet (600 mg total) by mouth every 6 (six) hours. 08/24/18   Will Bonnet, MD  Multiple Vitamins-Minerals (WOMENS MULTIVITAMIN PO) Take 1 tablet by mouth at bedtime.    [provider]  naproxen (NAPROSYN) 500 MG tablet Take 1 tablet (500 mg total) by mouth 2 (two) times daily with a meal. 12/21/18   Sable Feil, PA-C  ondansetron (ZOFRAN ODT) 8 MG disintegrating tablet Take 1 tablet (8 mg total) by mouth every 8 (eight) hours as needed for nausea or vomiting. 08/24/18   Will Bonnet, MD  oxyCODONE-acetaminophen (PERCOCET) 5-325 MG tablet Take 2 tablets by mouth every 6 (six) hours as needed (breakthrough pain). 08/24/18   Will Bonnet, MD  traMADol (ULTRAM) 50 MG tablet Take 1 tablet (50 mg total) by mouth every 6 (six) hours as needed for severe pain. 02/01/18 08/21/18  Milinda Pointer,  MD    Allergies Patient has no known allergies.  Family History  Problem Relation Age of Onset  . Diabetes Mother   . Hypertension Mother   . Heart disease Father   . Cancer Father   . Heart disease Brother     Social History Social History   Tobacco Use  . Smoking status: Former Smoker    Years: 15.00    Types: Cigarettes    Last attempt to  quit: 06/30/2004    Years since quitting: 14.4  . Smokeless tobacco: Never Used  Substance Use Topics  . Alcohol use: Yes    Comment: OCC  . Drug use: No    Review of Systems Constitutional: No fever/chills Eyes: No visual changes. ENT: No sore throat. Cardiovascular: Denies chest pain. Respiratory: Denies shortness of breath. Gastrointestinal: No abdominal pain.  No nausea, no vomiting.  No diarrhea.  No constipation. Genitourinary: Negative for dysuria. Musculoskeletal: Right ankle/foot pain  skin: Negative for rash. Neurological: Negative for headaches, focal weakness or numbness. Psychiatric:  Depression ____________________________________________   PHYSICAL EXAM:  VITAL SIGNS: ED Triage Vitals [12/21/18 0722]  Enc Vitals Group     BP (!) 146/91     Pulse Rate 83     Resp 16     Temp 98.9 F (37.2 C)     Temp Source Oral     SpO2 97 %     Weight 224 lb (101.6 kg)     Height 5\' 2"  (1.575 m)     Head Circumference      Peak Flow      Pain Score 8     Pain Loc      Pain Edu?      Excl. in [place]?    Constitutional: Alert and oriented. Well appearing and in no acute distress. Cardiovascular: Normal rate, regular rhythm. Grossly normal heart sounds.  Good peripheral circulation. Respiratory: Normal respiratory effort.  No retractions. Lungs CTAB. Musculoskeletal: No obvious deformity or edema to the right foot or ankle.  Patient is moderate guarding with palpation dorsal aspect of the foot.  Neurologic:  Normal speech and language. No gross focal neurologic deficits are appreciated. No gait instability. Skin:  Skin is warm, dry and intact. No rash noted. Psychiatric: Mood and affect are normal. Speech and behavior are normal.  ____________________________________________   LABS (all labs ordered are listed, but only abnormal results are displayed)  Labs Reviewed - No data to  display ____________________________________________  EKG   ____________________________________________  RADIOLOGY  ED MD interpretation:    Official radiology report(s): Dg Ankle Complete Right  Result Date: 12/21/2018 CLINICAL DATA:  Dorsal right ankle pain for 4 weeks EXAM: RIGHT ANKLE - COMPLETE 3+ VIEW COMPARISON:  None. FINDINGS: There is no evidence of fracture, dislocation, or joint effusion. There is no evidence of arthropathy or other focal bone abnormality. Soft tissues are unremarkable. IMPRESSION: Negative. Electronically Signed   By: Monte Fantasia M.D.   On: 12/21/2018 08:19    ____________________________________________   PROCEDURES  Procedure(s) performed: None  .Splint Application Date/Time: 4/40/1027 8:30 AM Performed by: Tenny Craw, NT Authorized by: Sable Feil, PA-C   Consent:    Consent obtained:  Verbal   Consent given by:  Patient   Risks discussed:  Numbness, pain and swelling Pre-procedure details:    Sensation:  Normal Procedure details:    Laterality:  Right   Location:  Ankle   Ankle:  R ankle   Splint type:  Ankle  stirrup   Supplies:  Prefabricated splint Post-procedure details:    Pain:  Unchanged   Sensation:  Normal   Patient tolerance of procedure:  Tolerated well, no immediate complications    Critical Care performed: No  ____________________________________________   INITIAL IMPRESSION / ASSESSMENT AND PLAN / ED COURSE  As part of my medical decision making, I reviewed the following data within the [place]     Patient presents with right foot pain for 1 month.  Discussed negative x-ray findings with patient.  Patient given discharge care instruction advised follow podiatry for definitive evaluation and treatment.      ____________________________________________   FINAL CLINICAL IMPRESSION(S) / ED DIAGNOSES  Final diagnoses:  Acute right ankle pain     ED Discharge Orders          Ordered    naproxen (NAPROSYN) 500 MG tablet  2 times daily with meals     12/21/18 0828           Note:  This document was prepared using Dragon voice recognition software and may include unintentional dictation errors.    Sable Feil, PA-C 12/21/18 [place], Randall An, MD 12/21/18 [phone]

## 2018-12-21 NOTE — ED Notes (Signed)
X-ray at bedside

## 2018-12-21 NOTE — ED Notes (Signed)
PA at bedside.

## 2018-12-21 NOTE — Discharge Instructions (Signed)
Follow discharge care instructions.  Wear ankle splint as needed.  Follow podiatry for definitive evaluation and treatment.

## 2019-07-10 ENCOUNTER — Other Ambulatory Visit: Payer: Self-pay

## 2019-07-10 ENCOUNTER — Emergency Department
Admission: EM | Admit: 2019-07-10 | Discharge: 2019-07-10 | Disposition: A | Discharge status: Home / Self Care | Payer: BC Managed Care – PPO | Attending: Emergency Medicine | Admitting: Emergency Medicine

## 2019-07-10 ENCOUNTER — Encounter: Payer: Self-pay | Admitting: Medical Oncology

## 2019-07-10 DIAGNOSIS — B0089 Other herpesviral infection: Secondary | ICD-10-CM | POA: Diagnosis not present

## 2019-07-10 DIAGNOSIS — B001 Herpesviral vesicular dermatitis: Secondary | ICD-10-CM | POA: Diagnosis not present

## 2019-07-10 DIAGNOSIS — Z87891 Personal history of nicotine dependence: Secondary | ICD-10-CM | POA: Insufficient documentation

## 2019-07-10 MED ORDER — DENAVIR 1 % EX CREA: 1.0000 "application " | TOPICAL_CREAM | CUTANEOUS | 3 refills | Status: DC

## 2019-07-10 NOTE — ED Triage Notes (Signed)
Pt reports fever blister on lip that began yesterday- pt reports she keeps getting them.

## 2019-07-10 NOTE — ED Provider Notes (Signed)
[facility] Emergency Department Provider Note   ____________________________________________   First MD Initiated Contact with Patient 07/10/19 [phone]     (approximate)  I have reviewed the triage vital signs and the nursing notes.   HISTORY  Chief Complaint Blister    HPI Sherry Watkins is a 38 y.o. female patient presents with a vascular lesion right upper lip.  Patient states this is a reoccurring lesion especially when she is under stress.  Patient onset was a tingling sensation to the upper lip and lesion appeared 24 hours later.  Patient states this is the third day.  Patient no relief over-the-counter preparations.  Patient denies fever chills associated with complaint.         Past Medical History:  Diagnosis Date  . Complication of anesthesia   . Depression   . Endometriosis   . GERD (gastroesophageal reflux disease)    NO MEDS  . Headache    FREQUENTLY  . Ovarian cyst     Patient Active Problem List   Diagnosis Date Noted  . Endometriosis 10/17/2018  . Adenomyosis 08/17/2018  . Chronic low back pain (Right) without sciatica 02/09/2018  . Elevated sed rate 02/01/2018  . Vitamin D deficiency 02/01/2018  . Chronic sacroiliac joint pain (Right) 02/01/2018  . Other specified dorsopathies, sacral and sacrococcygeal region 02/01/2018  . Chronic abdominal pain (RLQ) (Primary Area of Pain) (Right) 12/26/2017  . Chronic lower extremity pain (Secondary Area of Pain) (Right) 12/26/2017  . Chronic low back pain with right-sided sciatica [facility] Area of Pain) (Bilateral) (R>L) 12/26/2017  . Chronic pain syndrome 12/26/2017  . Opiate use 12/26/2017  . Problems influencing health status 12/26/2017  . Pharmacologic therapy 12/26/2017  . Disorder of skeletal system 12/26/2017    Past Surgical History:  Procedure Laterality Date  . ABLATION    . CESAREAN SECTION    . CYSTOSCOPY N/A 08/24/2018   Procedure: CYSTOSCOPY;  Surgeon: Will Bonnet, MD;  Location: [facility] ORS;  Service: Gynecology;  Laterality: N/A;  . HYSTEROSCOPY WITH NOVASURE N/A 05/27/2015   Procedure: HYSTEROSCOPY WITH NOVASURE;  Surgeon: Aletha Halim, MD;  Location: [facility] ORS;  Service: Gynecology;  Laterality: N/A;  . LAPAROSCOPY N/A 07/07/2017   Procedure: LAPAROSCOPY DIAGNOSTIC;  Surgeon: Malachy Mood, MD;  Location: [facility] ORS;  Service: Gynecology;  Laterality: N/A;  . ROBOTIC ASSISTED TOTAL HYSTERECTOMY Bilateral 08/24/2018   Procedure: ROBOTIC ASSISTED TOTAL HYSTERECTOMY BILATERAL SALPINGECTOMY;  Surgeon: Will Bonnet, MD;  Location: [facility] ORS;  Service: Gynecology;  Laterality: Bilateral;  . TUBAL LIGATION      Prior to Admission medications   Medication Sig Start Date End Date Taking? Authorizing Provider  ibuprofen (ADVIL,MOTRIN) 600 MG tablet Take 1 tablet (600 mg total) by mouth every 6 (six) hours. 08/24/18   Will Bonnet, MD  Multiple Vitamins-Minerals (WOMENS MULTIVITAMIN PO) Take 1 tablet by mouth at bedtime.    [provider]  penciclovir (DENAVIR) 1 % cream Apply 1 application topically every 3 (three) hours while awake. 07/10/19   Sable Feil, PA-C  traMADol (ULTRAM) 50 MG tablet Take 1 tablet (50 mg total) by mouth every 6 (six) hours as needed for severe pain. 02/01/18 08/21/18  Milinda Pointer, MD    Allergies Patient has no known allergies.  Family History  Problem Relation Age of Onset  . Diabetes Mother   . Hypertension Mother   . Heart disease Father   . Cancer Father   . Heart disease Brother  Social History Social History   Tobacco Use  . Smoking status: Former Smoker    Years: 15.00    Types: Cigarettes    Quit date: 06/30/2004    Years since quitting: 15.0  . Smokeless tobacco: Never Used  Substance Use Topics  . Alcohol use: Yes    Comment: OCC  . Drug use: No    Review of Systems Constitutional: No fever/chills Eyes: No visual changes. ENT: No sore throat. Cardiovascular:  Denies chest pain. Respiratory: Denies shortness of breath. Gastrointestinal: No abdominal pain.  No nausea, no vomiting.  No diarrhea.  No constipation. Genitourinary: Negative for dysuria. Musculoskeletal: Negative for back pain. Skin: Positive for rash. Neurological: Negative for headaches, focal weakness or numbness.   ____________________________________________   PHYSICAL EXAM:  VITAL SIGNS: ED Triage Vitals  Enc Vitals Group     BP 07/10/19 0717 (!) 162/97     Pulse Rate 07/10/19 0717 92     Resp 07/10/19 0717 20     Temp 07/10/19 0717 98.6 F (37 C)     Temp Source 07/10/19 0717 Oral     SpO2 07/10/19 0717 99 %     Weight 07/10/19 0717 222 lb 10.6 oz (101 kg)     Height 07/10/19 0718 5\' 2"  (1.575 m)     Head Circumference --      Peak Flow --      Pain Score 07/10/19 0716 0     Pain Loc --      Pain Edu? --      Excl. in [place]? --    Constitutional: Alert and oriented. Well appearing and in no acute distress. Eyes: Conjunctivae are normal. PERRL. EOMI. Nose: No congestion/rhinnorhea. Mouth/Throat: Mucous membranes are moist.  Oropharynx non-erythematous. Neck: No stridor.  Hematological/Lymphatic/Immunilogical: No cervical lymphadenopathy. Cardiovascular: Normal rate, regular rhythm. Grossly normal heart sounds.  Good peripheral circulation. Respiratory: Normal respiratory effort.  No retractions. Lungs CTAB. Skin:  Skin is warm, dry and intact.  Single vesicular lesion right upper lip. Psychiatric: Mood and affect are normal. Speech and behavior are normal.  ____________________________________________   LABS (all labs ordered are listed, but only abnormal results are displayed)  Labs Reviewed - No data to display ____________________________________________  EKG   ____________________________________________  RADIOLOGY  ED MD interpretation:    Official radiology report(s): No results found.  ____________________________________________    PROCEDURES  Procedure(s) performed (including Critical Care):  Procedures   ____________________________________________   INITIAL IMPRESSION / ASSESSMENT AND PLAN / ED COURSE  As part of my medical decision making, I reviewed the following data within the [place] was evaluated in Emergency Department on 07/10/2019 for the symptoms described in the history of present illness. She was evaluated in the context of the global COVID-19 pandemic, which necessitated consideration that the patient might be at risk for infection with the SARS-CoV-2 virus that causes COVID-19. Institutional protocols and algorithms that pertain to the evaluation of patients at risk for COVID-19 are in a state of rapid change based on information released by regulatory bodies including the CDC and federal and state organizations. These policies and algorithms were followed during the patient's care in the ED.  Patient presented with a single vesicle lesion right upper lip consistent with herpes simplex 1.  Patient given discharge care instruction and prescription for Denavir.  Patient advised follow-up PCP.      ____________________________________________   FINAL CLINICAL IMPRESSION(S) /  ED DIAGNOSES  Final diagnoses:  Recurrent cold sores     ED Discharge Orders         Ordered    penciclovir (DENAVIR) 1 % cream  Every  3 hours while awake     07/10/19 0735           Note:  This document was prepared using Dragon voice recognition software and may include unintentional dictation errors.    Sable Feil, PA-C 07/10/19 0740    Schuyler Amor, MD 07/10/19 [phone]

## 2019-07-10 NOTE — ED Notes (Addendum)
See triage note   States she felt some itching to upper lip yesterday  Woke up wih small blister to upper lip this am   Hx of fever blisters in past

## 2019-07-21 ENCOUNTER — Emergency Department
Admission: EM | Admit: 2019-07-21 | Discharge: 2019-07-21 | Disposition: A | Discharge status: Home / Self Care | Payer: BC Managed Care – PPO | Attending: Emergency Medicine | Admitting: Emergency Medicine

## 2019-07-21 ENCOUNTER — Other Ambulatory Visit: Payer: Self-pay

## 2019-07-21 ENCOUNTER — Emergency Department: Payer: BC Managed Care – PPO

## 2019-07-21 ENCOUNTER — Encounter: Payer: Self-pay | Admitting: Emergency Medicine

## 2019-07-21 DIAGNOSIS — Z87891 Personal history of nicotine dependence: Secondary | ICD-10-CM | POA: Diagnosis not present

## 2019-07-21 DIAGNOSIS — Z79899 Other long term (current) drug therapy: Secondary | ICD-10-CM | POA: Diagnosis not present

## 2019-07-21 DIAGNOSIS — R079 Chest pain, unspecified: Secondary | ICD-10-CM | POA: Diagnosis not present

## 2019-07-21 DIAGNOSIS — R0789 Other chest pain: Secondary | ICD-10-CM | POA: Insufficient documentation

## 2019-07-21 LAB — CBC
HCT: 42.4 % (ref 36.0–46.0)
Hemoglobin: 13.9 g/dL (ref 12.0–15.0)
MCH: 29.1 pg (ref 26.0–34.0)
MCHC: 32.8 g/dL (ref 30.0–36.0)
MCV: 88.9 fL (ref 80.0–100.0)
Platelets: 380 10*3/uL (ref 150–400)
RBC: 4.77 MIL/uL (ref 3.87–5.11)
RDW: 13.2 % (ref 11.5–15.5)
WBC: 8.6 10*3/uL (ref 4.0–10.5)
nRBC: 0 % (ref 0.0–0.2)

## 2019-07-21 LAB — TROPONIN I (HIGH SENSITIVITY)
Troponin I (High Sensitivity): 4 ng/L (ref <18)
Troponin I (High Sensitivity): 3 ng/L (ref <18)

## 2019-07-21 LAB — BASIC METABOLIC PANEL
Anion gap: 10 (ref 5–15)
BUN: 12 mg/dL (ref 6–20)
CO2: 23 mmol/L (ref 22–32)
Calcium: 9.2 mg/dL (ref 8.9–10.3)
Chloride: 105 mmol/L (ref 98–111)
Creatinine, Ser: 0.82 mg/dL (ref 0.44–1.00)
GFR calc Af Amer: >60 mL/min (ref >60)
GFR calc non Af Amer: >60 mL/min (ref >60)
Glucose, Bld: 95 mg/dL (ref 70–99)
Potassium: 3.4 mmol/L — ABNORMAL LOW (ref 3.5–5.1)
Sodium: 138 mmol/L (ref 135–145)

## 2019-07-21 LAB — FIBRIN DERIVATIVES D-DIMER (ARMC ONLY): Fibrin derivatives D-dimer (ARMC): 553.53 ng/mL (FEU) — ABNORMAL HIGH (ref 0.00–499.00)

## 2019-07-21 LAB — HCG, QUANTITATIVE, PREGNANCY: hCG, Beta Chain, Quant, S: <1 m[IU]/mL (ref <5)

## 2019-07-21 MED ORDER — HYDROMORPHONE HCL 1 MG/ML IJ SOLN: 0.5000 mg | Freq: Once | INTRAMUSCULAR | Status: DC

## 2019-07-21 MED ORDER — CYCLOBENZAPRINE HCL 5 MG PO TABS: 5.0000 mg | ORAL_TABLET | Freq: Three times a day (TID) | ORAL | 0 refills | Status: AC | PRN

## 2019-07-21 MED ORDER — IOHEXOL 350 MG/ML SOLN: 75.0000 mL | Freq: Once | INTRAVENOUS | Status: DC | PRN

## 2019-07-21 MED ORDER — NAPROXEN 500 MG PO TABS: 500.0000 mg | ORAL_TABLET | Freq: Two times a day (BID) | ORAL | 0 refills | Status: AC

## 2019-07-21 MED ORDER — KETOROLAC TROMETHAMINE 30 MG/ML IJ SOLN
30.0000 mg | Freq: Once | INTRAMUSCULAR | Status: AC
  Administered 2019-07-21: 16:00:00 30 mg via INTRAVENOUS
  Filled 2019-07-21: qty 1

## 2019-07-21 MED ORDER — SODIUM CHLORIDE 0.9% FLUSH: 3.0000 mL | Freq: Once | INTRAVENOUS | Status: DC

## 2019-07-21 MED ORDER — ACETAMINOPHEN 500 MG PO TABS
1000.0000 mg | ORAL_TABLET | Freq: Once | ORAL | Status: AC
  Administered 2019-07-21: 16:00:00 1000 mg via ORAL
  Filled 2019-07-21: qty 2

## 2019-07-21 NOTE — ED Triage Notes (Signed)
Pt to ED via POV c/o left sided chest pain since Wednesday. Pt reports that pain increased throughout the night. Pt also reports right arm pain and that pain in her chest is worse when taking a deep breath. Pt denies any other associated symptoms. Pt states that there is nothing that makes her pain better. 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 Pt is in NAD at this time.

## 2019-07-21 NOTE — Discharge Instructions (Addendum)
Your d-dimer was slightly elevated we recommended further imaging to rule out blood clot.  You have elected not to get this done.  We discussed the benefits and risk of this.  We recommend that you closely monitor your symptoms and return immediately if you develop symptoms or you decide that you like to have a CT scan done.  Your blood pressure was elevated you should have this followed up with your primary care doctor for retest to see if you should be started on any medications.  Your work-up was reassuring.  No evidence of heart attack or blood clot.  This is most likely musculoskeletal pain. Do not drive with flexeril.

## 2019-07-21 NOTE — ED Notes (Signed)
E-signature not working at this time. Pt verbalized understanding of D/C instructions and follow up care. Pt in NAD at time of D/C. Pt ambulatory to lobby.

## 2019-07-21 NOTE — ED Provider Notes (Signed)
[facility] Emergency Department Provider Note  ____________________________________________   First MD Initiated Contact with Patient 07/21/19 1458     (approximate)  I have reviewed the triage vital signs and the nursing notes.   HISTORY  Chief Complaint Chest Pain    HPI Sherry Watkins is a 38 y.o. female with depression, endometriosis who presents with left-sided chest pain that started on Wednesday.  Patient versus the pain is increased throughout the night.  The pain has been constant, has not taken anything at home for the pain, worse with taking a deep breath and worse with pressing on the area, occasionally radiates into her left shoulder.  Denies any leg swelling, risk factors for PE, fevers, abdominal pain, shortness of breath.  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  She is unsure if she may be strained a muscle.        Past Medical History:  Diagnosis Date  . Complication of anesthesia   . Depression   . Endometriosis   . GERD (gastroesophageal reflux disease)    NO MEDS  . Headache    FREQUENTLY  . Ovarian cyst     Patient Active Problem List   Diagnosis Date Noted  . Endometriosis 10/17/2018  . Adenomyosis 08/17/2018  . Chronic low back pain (Right) without sciatica 02/09/2018  . Elevated sed rate 02/01/2018  . Vitamin D deficiency 02/01/2018  . Chronic sacroiliac joint pain (Right) 02/01/2018  . Other specified dorsopathies, sacral and sacrococcygeal region 02/01/2018  . Chronic abdominal pain (RLQ) (Primary Area of Pain) (Right) 12/26/2017  . Chronic lower extremity pain (Secondary Area of Pain) (Right) 12/26/2017  . Chronic low back pain with right-sided sciatica [facility] Area of Pain) (Bilateral) (R>L) 12/26/2017  . Chronic pain syndrome 12/26/2017  . Opiate use 12/26/2017  . Problems influencing health status 12/26/2017  . Pharmacologic therapy 12/26/2017  . Disorder of skeletal system 12/26/2017     Past Surgical History:  Procedure Laterality Date  . ABLATION    . CESAREAN SECTION    . CYSTOSCOPY N/A 08/24/2018   Procedure: CYSTOSCOPY;  Surgeon: Will Bonnet, MD;  Location: [facility] ORS;  Service: Gynecology;  Laterality: N/A;  . HYSTEROSCOPY WITH NOVASURE N/A 05/27/2015   Procedure: HYSTEROSCOPY WITH NOVASURE;  Surgeon: Aletha Halim, MD;  Location: [facility] ORS;  Service: Gynecology;  Laterality: N/A;  . LAPAROSCOPY N/A 07/07/2017   Procedure: LAPAROSCOPY DIAGNOSTIC;  Surgeon: Malachy Mood, MD;  Location: [facility] ORS;  Service: Gynecology;  Laterality: N/A;  . ROBOTIC ASSISTED TOTAL HYSTERECTOMY Bilateral 08/24/2018   Procedure: ROBOTIC ASSISTED TOTAL HYSTERECTOMY BILATERAL SALPINGECTOMY;  Surgeon: Will Bonnet, MD;  Location: [facility] ORS;  Service: Gynecology;  Laterality: Bilateral;  . TUBAL LIGATION      Prior to Admission medications   Medication Sig Start Date End Date Taking? Authorizing Provider  ibuprofen (ADVIL,MOTRIN) 600 MG tablet Take 1 tablet (600 mg total) by mouth every 6 (six) hours. 08/24/18   Will Bonnet, MD  Multiple Vitamins-Minerals (WOMENS MULTIVITAMIN PO) Take 1 tablet by mouth at bedtime.    [provider]  penciclovir (DENAVIR) 1 % cream Apply 1 application topically every 3 (three) hours while awake. 07/10/19   Sable Feil, PA-C  traMADol (ULTRAM) 50 MG tablet Take 1 tablet (50 mg total) by mouth every 6 (six) hours as needed for severe pain. 02/01/18 08/21/18  Milinda Pointer, MD    Allergies Patient has no known allergies.  Family History  Problem  Relation Age of Onset  . Diabetes Mother   . Hypertension Mother   . Heart disease Father   . Cancer Father   . Heart disease Brother     Social History Social History   Tobacco Use  . Smoking status: Former Smoker    Years: 15.00    Types: Cigarettes    Quit date: 06/30/2004    Years since quitting: 15.0  . Smokeless tobacco: Never Used  Substance Use Topics  . Alcohol  use: Yes    Comment: OCC  . Drug use: No      Review of Systems Constitutional: No fever/chills Eyes: No visual changes. ENT: No sore throat. Cardiovascular: Positive chest pain Respiratory: Denies shortness of breath. Gastrointestinal: No abdominal pain.  No nausea, no vomiting.  No diarrhea.  No constipation. Genitourinary: Negative for dysuria. Musculoskeletal: Negative for back pain. Skin: Negative for rash. Neurological: Negative for headaches, focal weakness or numbness. All other ROS negative ____________________________________________   PHYSICAL EXAM:  VITAL SIGNS: ED Triage Vitals  Enc Vitals Group     BP 07/21/19 1156 (!) 162/96     Pulse Rate 07/21/19 1156 72     Resp 07/21/19 1156 16     Temp 07/21/19 1156 98.4 F (36.9 C)     Temp Source 07/21/19 1156 Oral     SpO2 07/21/19 1156 96 %     Weight 07/21/19 1153 220 lb (99.8 kg)     Height 07/21/19 1153 5\' 2"  (1.575 m)     Head Circumference --      Peak Flow --      Pain Score 07/21/19 1153 8     Pain Loc --      Pain Edu? --      Excl. in [place]? --     Constitutional: Alert and oriented. Well appearing and in no acute distress but tearful Eyes: Conjunctivae are normal. EOMI. Head: Atraumatic. Nose: No congestion/rhinnorhea. Mouth/Throat: Mucous membranes are moist.   Neck: No stridor. Trachea Midline. FROM Cardiovascular: Normal rate, regular rhythm. Grossly normal heart sounds.  Good peripheral circulation.  Chest wall pain on the left side Respiratory: Normal respiratory effort.  No retractions. Lungs CTAB. Gastrointestinal: Soft and nontender. No distention. No abdominal bruits.  Musculoskeletal: No lower extremity tenderness nor edema.  No joint effusions. Neurologic:  Normal speech and language. No gross focal neurologic deficits are appreciated.  Skin:  Skin is warm, dry and intact. No rash noted. Psychiatric: Mood and affect are normal. Speech and behavior are normal. GU: Deferred    ____________________________________________   LABS (all labs ordered are listed, but only abnormal results are displayed)  Labs Reviewed  BASIC METABOLIC PANEL - Abnormal; Notable for the following components:      Result Value   Potassium 3.4 (*)    All other components within normal limits  FIBRIN DERIVATIVES D-DIMER ([facility] ONLY) - Abnormal; Notable for the following components:   Fibrin derivatives D-dimer ([facility]) 553.53 (*)    All other components within normal limits  CBC  HCG, QUANTITATIVE, PREGNANCY  POC URINE PREG, ED  POC URINE PREG, ED  TROPONIN I (HIGH SENSITIVITY)  TROPONIN I (HIGH SENSITIVITY)   ____________________________________________   ED ECG REPORT I, Vanessa Crump, the attending physician, personally viewed and interpreted this ECG.  EKG is normal sinus rate of 66, no ST elevation, T wave inversion in lead III, right axis deviation, normal intervals.  T wave inversion is similar from prior EKG although right axis deviation  appears new. ____________________________________________  RADIOLOGY Robert Bellow, personally viewed and evaluated these images (plain radiographs) as part of my medical decision making, as well as reviewing the written report by the radiologist.  ED MD interpretation: Chest x-ray without evidence of pneumonia  Official radiology report(s): Dg Chest 2 View  Result Date: 07/21/2019 CLINICAL DATA:  Left chest pain radiating into left arm for several days. EXAM: CHEST - 2 VIEW COMPARISON:  None. FINDINGS: Mild atelectasis in the left base. The heart, hila, mediastinum, lungs, and pleura are otherwise unremarkable. IMPRESSION: No active cardiopulmonary disease. Electronically Signed   By: Dorise Bullion III M.D   On: 07/21/2019 12:45    ____________________________________________   PROCEDURES  Procedure(s) performed (including Critical Care):  Procedures   ____________________________________________   INITIAL IMPRESSION /  ASSESSMENT AND PLAN / ED COURSE   Sherry Watkins was evaluated in Emergency Department on 07/21/2019 for the symptoms described in the history of present illness. She was evaluated in the context of the global COVID-19 pandemic, which necessitated consideration that the patient might be at risk for infection with the SARS-CoV-2 virus that causes COVID-19. Institutional protocols and algorithms that pertain to the evaluation of patients at risk for COVID-19 are in a state of rapid change based on information released by regulatory bodies including the CDC and federal and state organizations. These policies and algorithms were followed during the patient's care in the ED.    Most Likely DDx:  -MSK (atypical chest pain) but will get cardiac markers to evaluate for ACS given risk factors/age.  I had a lengthy discussion with patient given her pleuritic chest pain her EKG shows new right axis deviation about the pros and cons of getting a d-dimer test.  We elected proceed with d-dimer for PE rule out given patient has no other risk factors and has a low Wells score.  If negative this is most likely musculoskeletal pain given pain is reproducible.  Patient also noted to be hypertensive on initial assessment.  Recommended patient follow-up with her primary care doctor for repeat blood pressure.   DDx that was also considered d/t potential to cause harm, but was found less likely based on history and physical (as detailed above): -PNA (no fevers, cough but CXR to evaluate) -PNX (reassured with equal b/l breath sounds, CXR to evaluate) -Symptomatic anemia (will get H&H) -Aortic Dissection as no tearing pain and no radiation to the mid back, pulses equal -Pericarditis no rub on exam, EKG changes or hx to suggest dx -Tamponade (no notable SOB, tachycardic, hypotensive) -Esophageal rupture (no h/o diffuse vomitting/no crepitus)   Troponin is 4.  White count is normal therefore lower suspicion for infection.   K is at 3.4.  We will give patient Toradol, Tylenol and check d-dimer  Patient not saying that she would not like to proceed with CT scan.  I had a lengthy discussion with patient about the benefits and risk of CT scan including disability and death.  Patient has capacity to make these decisions and is able to repeat back the risk of not doing CT scan and missing a pulmonary embolism.  Patient understands that if she develops shortness of breath or worsening symptoms she needs to return the ER immediately.  I also discussed VQ scan with patient again she did not want to stay for any other imaging.  Given I think that more than likely this simply musculoskeletal I will prescribe her naproxen and Flexeril to take.  I had a  lengthy discussion with patient about return precautions and she expressed understanding. ____________________________________________   FINAL CLINICAL IMPRESSION(S) / ED DIAGNOSES   Final diagnoses:  Chest wall pain     MEDICATIONS GIVEN DURING THIS VISIT:  Medications  sodium chloride flush (NS) 0.9 % injection 3 mL (has no administration in time range)  HYDROmorphone (DILAUDID) injection 0.5 mg (has no administration in time range)  iohexol (OMNIPAQUE) 350 MG/ML injection 75 mL (has no administration in time range)  ketorolac (TORADOL) 30 MG/ML injection 30 mg (30 mg Intravenous Given 07/21/19 1541)  acetaminophen (TYLENOL) tablet 1,000 mg (1,000 mg Oral Given 07/21/19 1541)     ED Discharge Orders    None       Note:  This document was prepared using Dragon voice recognition software and may include unintentional dictation errors.   Vanessa [place], MD 07/21/19 [phone]

## 2019-07-22 ENCOUNTER — Encounter: Payer: Self-pay | Admitting: Emergency Medicine

## 2019-07-22 ENCOUNTER — Emergency Department
Admission: EM | Admit: 2019-07-22 | Discharge: 2019-07-22 | Disposition: A | Discharge status: Home / Self Care | Payer: BC Managed Care – PPO | Attending: Emergency Medicine | Admitting: Emergency Medicine

## 2019-07-22 ENCOUNTER — Other Ambulatory Visit: Payer: Self-pay

## 2019-07-22 ENCOUNTER — Emergency Department: Payer: BC Managed Care – PPO

## 2019-07-22 DIAGNOSIS — Z79899 Other long term (current) drug therapy: Secondary | ICD-10-CM | POA: Diagnosis not present

## 2019-07-22 DIAGNOSIS — Z87891 Personal history of nicotine dependence: Secondary | ICD-10-CM | POA: Diagnosis not present

## 2019-07-22 DIAGNOSIS — R079 Chest pain, unspecified: Secondary | ICD-10-CM | POA: Diagnosis not present

## 2019-07-22 DIAGNOSIS — R0781 Pleurodynia: Secondary | ICD-10-CM | POA: Diagnosis not present

## 2019-07-22 LAB — CBC
HCT: 44.8 % (ref 36.0–46.0)
Hemoglobin: 14.5 g/dL (ref 12.0–15.0)
MCH: 29.1 pg (ref 26.0–34.0)
MCHC: 32.4 g/dL (ref 30.0–36.0)
MCV: 90 fL (ref 80.0–100.0)
Platelets: 370 10*3/uL (ref 150–400)
RBC: 4.98 MIL/uL (ref 3.87–5.11)
RDW: 13.2 % (ref 11.5–15.5)
WBC: 7.1 10*3/uL (ref 4.0–10.5)
nRBC: 0 % (ref 0.0–0.2)

## 2019-07-22 LAB — BASIC METABOLIC PANEL
Anion gap: 11 (ref 5–15)
BUN: 8 mg/dL (ref 6–20)
CO2: 23 mmol/L (ref 22–32)
Calcium: 9.3 mg/dL (ref 8.9–10.3)
Chloride: 107 mmol/L (ref 98–111)
Creatinine, Ser: 0.75 mg/dL (ref 0.44–1.00)
GFR calc Af Amer: >60 mL/min (ref >60)
GFR calc non Af Amer: >60 mL/min (ref >60)
Glucose, Bld: 95 mg/dL (ref 70–99)
Potassium: 3.7 mmol/L (ref 3.5–5.1)
Sodium: 141 mmol/L (ref 135–145)

## 2019-07-22 LAB — TROPONIN I (HIGH SENSITIVITY): Troponin I (High Sensitivity): 4 ng/L (ref <18)

## 2019-07-22 MED ORDER — KETOROLAC TROMETHAMINE 30 MG/ML IJ SOLN
30.0000 mg | Freq: Once | INTRAMUSCULAR | Status: AC
  Administered 2019-07-22: 17:00:00 30 mg via INTRAVENOUS
  Filled 2019-07-22: qty 1

## 2019-07-22 MED ORDER — FENTANYL CITRATE (PF) 100 MCG/2ML IJ SOLN: 75.0000 ug | Freq: Once | INTRAMUSCULAR | Status: DC

## 2019-07-22 MED ORDER — IOHEXOL 350 MG/ML SOLN
75.0000 mL | Freq: Once | INTRAVENOUS | Status: AC | PRN
  Administered 2019-07-22: 16:00:00 75 mL via INTRAVENOUS

## 2019-07-22 NOTE — ED Notes (Addendum)
FIRST NURSE NOTE:  Pt seen yesterday for chest pain, back today because she decided to have the CT scan to evaluated for blood clot. No distress noted on arrival.  Pt asking for warm blanket, informed her that she would need to wait for triage before receiving a blanket.   Pt also wants daughter to be called once she is in a room to see the doctor. Daughter's phone number is on the back of stickers.

## 2019-07-22 NOTE — ED Provider Notes (Signed)
[facility] Emergency Department Provider Note  ____________________________________________   None    (approximate)  I have reviewed the triage vital signs and the nursing notes.   HISTORY  Chief Complaint Chest Pain    HPI Sherry Watkins is a 38 y.o. female depression endometriosis who I saw yesterday for left-sided chest pain with a new right axis deviation who had gotten a d-dimer test on that was slightly elevated who recommended CT scan for who then decided to leave is now returning for her CT scan.  Patient states that she went home and her symptoms have continued.  She talked to her children about the CT scan and they wanted her to come back to make sure that there was no blood clots.  Her pain is pleuritic in nature, worse with pushing on it, nothing makes it better, nonradiating.     Past Medical History:  Diagnosis Date   Complication of anesthesia    Depression    Endometriosis    GERD (gastroesophageal reflux disease)    NO MEDS   Headache    FREQUENTLY   Ovarian cyst     Patient Active Problem List   Diagnosis Date Noted   Endometriosis 10/17/2018   Adenomyosis 08/17/2018   Chronic low back pain (Right) without sciatica 02/09/2018   Elevated sed rate 02/01/2018   Vitamin D deficiency 02/01/2018   Chronic sacroiliac joint pain (Right) 02/01/2018   Other specified dorsopathies, sacral and sacrococcygeal region 02/01/2018   Chronic abdominal pain (RLQ) (Primary Area of Pain) (Right) 12/26/2017   Chronic lower extremity pain (Secondary Area of Pain) (Right) 12/26/2017   Chronic low back pain with right-sided sciatica [facility] Area of Pain) (Bilateral) (R>L) 12/26/2017   Chronic pain syndrome 12/26/2017   Opiate use 12/26/2017   Problems influencing health status 12/26/2017   Pharmacologic therapy 12/26/2017   Disorder of skeletal system 12/26/2017    Past Surgical History:  Procedure Laterality Date    ABLATION     CESAREAN SECTION     CYSTOSCOPY N/A 08/24/2018   Procedure: CYSTOSCOPY;  Surgeon: Will Bonnet, MD;  Location: [facility] ORS;  Service: Gynecology;  Laterality: N/A;   HYSTEROSCOPY WITH NOVASURE N/A 05/27/2015   Procedure: HYSTEROSCOPY WITH NOVASURE;  Surgeon: Aletha Halim, MD;  Location: [facility] ORS;  Service: Gynecology;  Laterality: N/A;   LAPAROSCOPY N/A 07/07/2017   Procedure: LAPAROSCOPY DIAGNOSTIC;  Surgeon: Malachy Mood, MD;  Location: [facility] ORS;  Service: Gynecology;  Laterality: N/A;   ROBOTIC ASSISTED TOTAL HYSTERECTOMY Bilateral 08/24/2018   Procedure: ROBOTIC ASSISTED TOTAL HYSTERECTOMY BILATERAL SALPINGECTOMY;  Surgeon: Will Bonnet, MD;  Location: [facility] ORS;  Service: Gynecology;  Laterality: Bilateral;   TUBAL LIGATION      Prior to Admission medications   Medication Sig Start Date End Date Taking? Authorizing Provider  cyclobenzaprine (FLEXERIL) 5 MG tablet Take 1 tablet (5 mg total) by mouth 3 (three) times daily as needed for up to 5 days for muscle spasms. 07/21/19 07/26/19  Vanessa [place], MD  ibuprofen (ADVIL,MOTRIN) 600 MG tablet Take 1 tablet (600 mg total) by mouth every 6 (six) hours. Patient not taking: Reported on 07/21/2019 08/24/18   Will Bonnet, MD  Multiple Vitamins-Minerals (WOMENS MULTIVITAMIN PO) Take 1 tablet by mouth at bedtime.    [provider]  naproxen (NAPROSYN) 500 MG tablet Take 1 tablet (500 mg total) by mouth 2 (two) times daily with a meal for 15 days. 07/21/19 08/05/19  Vanessa [place], MD  penciclovir (DENAVIR) 1 % cream Apply 1 application topically every 3 (three) hours while awake. Patient not taking: Reported on 07/21/2019 07/10/19   Sable Feil, PA-C  traMADol (ULTRAM) 50 MG tablet Take 1 tablet (50 mg total) by mouth every 6 (six) hours as needed for severe pain. 02/01/18 08/21/18  Milinda Pointer, MD    Allergies Patient has no known allergies.  Family History  Problem Relation Age of Onset    Diabetes Mother    Hypertension Mother    Heart disease Father    Cancer Father    Heart disease Brother     Social History Social History   Tobacco Use   Smoking status: Former Smoker    Years: 15.00    Types: Cigarettes    Quit date: 06/30/2004    Years since quitting: 15.0   Smokeless tobacco: Never Used  Substance Use Topics   Alcohol use: Yes    Comment: OCC   Drug use: No      Review of Systems Constitutional: No fever/chills Eyes: No visual changes. ENT: No sore throat. Cardiovascular: Positive chest pain, pleuritic  Respiratory:no  SOB Gastrointestinal: No abdominal pain.  No nausea, no vomiting.  No diarrhea.  No constipation. Genitourinary: Negative for dysuria. Musculoskeletal: Negative for back pain. Skin: Negative for rash. Neurological: Negative for headaches, focal weakness or numbness. All other ROS negative ____________________________________________   PHYSICAL EXAM:  VITAL SIGNS: ED Triage Vitals  Enc Vitals Group     BP 07/22/19 1300 (!) 168/96     Pulse Rate 07/22/19 1300 70     Resp 07/22/19 1300 20     Temp 07/22/19 1300 98.9 F (37.2 C)     Temp Source 07/22/19 1300 Oral     SpO2 07/22/19 1300 99 %     Weight 07/22/19 1301 220 lb (99.8 kg)     Height 07/22/19 1301 5\' 2"  (1.575 m)     Head Circumference --      Peak Flow --      Pain Score 07/22/19 1300 8     Pain Loc --      Pain Edu? --      Excl. in [place]? --     Constitutional: Alert and oriented. Well appearing and in no acute distress. Eyes: Conjunctivae are normal. EOMI. Head: Atraumatic. Nose: No congestion/rhinnorhea. Mouth/Throat: Mucous membranes are moist.   Neck: No stridor. Trachea Midline. FROM Cardiovascular: Normal rate, regular rhythm. Grossly normal heart sounds.  Good peripheral circulation.  Chest wall tenderness Respiratory: Clear lungs bilaterally, normal work of breathing Gastrointestinal: Soft and nontender. No distention. No abdominal bruits.    Musculoskeletal: No lower extremity tenderness nor edema.  No joint effusions. Neurologic:  Normal speech and language. No gross focal neurologic deficits are appreciated.  Skin:  Skin is warm, dry and intact. No rash noted. Psychiatric: Mood and affect are normal. Speech and behavior are normal. GU: Deferred   ____________________________________________   LABS (all labs ordered are listed, but only abnormal results are displayed)  Labs Reviewed  BASIC METABOLIC PANEL  CBC  POC URINE PREG, ED  TROPONIN I (HIGH SENSITIVITY)  TROPONIN I (HIGH SENSITIVITY)   ____________________________________________   ED ECG REPORT I, Vanessa [place], the attending physician, personally viewed and interpreted this ECG.  EKG is normal sinus rate of 74, continues to have a flipped T wave in lead III although looks more normal axis at this time.  Normal intervals. ____________________________________________  [place]   Official radiology report(s):  Ct Angio Chest Pe W And/or Wo Contrast  Result Date: 07/22/2019 CLINICAL DATA:  Chest pain. EXAM: CT ANGIOGRAPHY CHEST WITH CONTRAST TECHNIQUE: Multidetector CT imaging of the chest was performed using the standard protocol during bolus administration of intravenous contrast. Multiplanar CT image reconstructions and MIPs were obtained to evaluate the vascular anatomy. CONTRAST:  52mL OMNIPAQUE IOHEXOL 350 MG/ML SOLN COMPARISON:  Chest radiograph from one day prior. FINDINGS: Cardiovascular: The study is high quality for the evaluation of pulmonary embolism. There are no filling defects in the central, lobar, segmental or subsegmental pulmonary artery branches to suggest acute pulmonary embolism. Great vessels are normal in course and caliber. Normal heart size. No significant pericardial fluid/thickening. Mediastinum/Nodes: No discrete thyroid nodules. Unremarkable esophagus. No pathologically enlarged axillary, mediastinal or hilar lymph nodes.  Lungs/Pleura: No pneumothorax. No pleural effusion. No acute consolidative airspace disease, lung masses or significant pulmonary nodules. Upper abdomen: Moderate hiatal hernia. Musculoskeletal:  No aggressive appearing focal osseous lesions. Review of the MIP images confirms the above findings. IMPRESSION: 1. No pulmonary embolism.  No active pulmonary disease. 2. Moderate hiatal hernia. Electronically Signed   By: Ilona Sorrel M.D.   On: 07/22/2019 16:28    ____________________________________________   PROCEDURES  Procedure(s) performed (including Critical Care):  Procedures   ____________________________________________   INITIAL IMPRESSION / ASSESSMENT AND PLAN / ED COURSE   Sherry Watkins was evaluated in Emergency Department on 07/22/2019 for the symptoms described in the history of present illness. She was evaluated in the context of the global COVID-19 pandemic, which necessitated consideration that the patient might be at risk for infection with the SARS-CoV-2 virus that causes COVID-19. Institutional protocols and algorithms that pertain to the evaluation of patients at risk for COVID-19 are in a state of rapid change based on information released by regulatory bodies including the CDC and federal and state organizations. These policies and algorithms were followed during the patient's care in the ED.    Pt presents with pleuritic chest pain.  Yesterday patient was seen and had a elevated d-dimer in the setting of a new right axis deviation on her EKG.  I had a lengthy discussion with patient about d-dimer versus just watching her pain.  We like to get d-dimer that was elevated.  Patient left before receiving the CT scan because she felt like she did not want it.  Patient returns today for the CT scan.  Will order CT to rule out pulmonary embolism.  If negative this is most likely musculoskeletal in nature.  Cardiac markers have ruled out ACS, no evidence of anemia, no evidence of  pneumonia or pneumothorax.  Low suspicion coronavirus given no fevers, shortness of breath or any other symptoms with the chest pain.   Labs are reassuring with negative troponin, no evidence of anemia.  CT scan is negative.  We will give patient Toradol and discharge patient home.  Patient feels comfortable with this plan.  This most likely musculoskeletal pain. I discussed he hiatal hernia with pt and to f/u with PCP.   I discussed the provisional nature of ED diagnosis, the treatment so far, the ongoing plan of care, follow up appointments and return precautions with the patient and any family or support people present. They expressed understanding and agreed with the plan, discharged home.     ____________________________________________   FINAL CLINICAL IMPRESSION(S) / ED DIAGNOSES   Final diagnoses:  Chest pain, unspecified type     MEDICATIONS GIVEN DURING THIS VISIT:  Medications  iohexol (OMNIPAQUE) 350 MG/ML injection 75 mL (75 mLs Intravenous Contrast Given 07/22/19 1607)  ketorolac (TORADOL) 30 MG/ML injection 30 mg (30 mg Intravenous Given 07/22/19 1641)     ED Discharge Orders    None       Note:  This document was prepared using Dragon voice recognition software and may include unintentional dictation errors.    Vanessa [place], MD 07/22/19 [phone]

## 2019-07-22 NOTE — ED Triage Notes (Signed)
Pt c/o left side chest pain, seen yesterday for the same, was offered CT scan but declined, back today for CT scan states her children scared her about blood clots and wants to be checked. No improvement in sxs today.

## 2019-07-22 NOTE — Discharge Instructions (Addendum)
Your work was reassuring.  This was most likely musculoskeletal pain.  You should return to the ER for worsening pain, shortness of breath, fevers or any other concerns  CT scan:  IMPRESSION: 1. No pulmonary embolism.  No active pulmonary disease. 2. Moderate hiatal hernia.

## 2019-10-02 ENCOUNTER — Emergency Department
Admission: EM | Admit: 2019-10-02 | Discharge: 2019-10-02 | Disposition: A | Discharge status: Home / Self Care | Payer: BC Managed Care – PPO | Attending: Emergency Medicine | Admitting: Emergency Medicine

## 2019-10-02 ENCOUNTER — Other Ambulatory Visit: Payer: Self-pay

## 2019-10-02 ENCOUNTER — Encounter: Payer: Self-pay | Admitting: Emergency Medicine

## 2019-10-02 DIAGNOSIS — B309 Viral conjunctivitis, unspecified: Secondary | ICD-10-CM

## 2019-10-02 DIAGNOSIS — Z87891 Personal history of nicotine dependence: Secondary | ICD-10-CM | POA: Insufficient documentation

## 2019-10-02 DIAGNOSIS — H5789 Other specified disorders of eye and adnexa: Secondary | ICD-10-CM | POA: Diagnosis present

## 2019-10-02 MED ORDER — TETRACAINE HCL 0.5 % OP SOLN
2.0000 [drp] | Freq: Once | OPHTHALMIC | Status: AC
  Administered 2019-10-02: 12:00:00 2 [drp] via OPHTHALMIC
  Filled 2019-10-02: qty 4

## 2019-10-02 MED ORDER — FLUORESCEIN SODIUM 1 MG OP STRP
ORAL_STRIP | OPHTHALMIC | Status: AC
  Administered 2019-10-02: 1 via OPHTHALMIC
  Filled 2019-10-02: qty 1

## 2019-10-02 MED ORDER — ERYTHROMYCIN 5 MG/GM OP OINT
1.0000 "application " | TOPICAL_OINTMENT | Freq: Four times a day (QID) | OPHTHALMIC | Status: DC
  Administered 2019-10-02: 1 via OPHTHALMIC
  Filled 2019-10-02: qty 1

## 2019-10-02 MED ORDER — FLUORESCEIN SODIUM 1 MG OP STRP
1.0000 | ORAL_STRIP | Freq: Once | OPHTHALMIC | Status: AC
  Administered 2019-10-02: 12:00:00 1 via OPHTHALMIC
  Filled 2019-10-02: qty 1

## 2019-10-02 MED ORDER — KETOROLAC TROMETHAMINE 0.5 % OP SOLN: 1.0000 [drp] | Freq: Four times a day (QID) | OPHTHALMIC | 0 refills | Status: DC

## 2019-10-02 NOTE — ED Notes (Signed)
Left eye drainage, edema and redness noted. Ambulatory to room. A&O x4. Even and non labored respirations noted.

## 2019-10-02 NOTE — ED Triage Notes (Signed)
PT c/o LFT eye drainage x2days. PT denies any fever. Redness and drainage noted.

## 2019-10-02 NOTE — ED Provider Notes (Signed)
[facility] Emergency Department Provider Note ____________________________________________  Time seen: 1145  I have reviewed the triage vital signs and the nursing notes.  HISTORY  Chief Complaint  Eye Drainage  HPI Sherry Watkins is a 38 y.o. female Presents herself to the ED for evaluation of a left eye complaint.  She reports 2 days of drainage in the left eye.  She reports clear, teary drainage and discomfort. She denies any intermittent fevers, chills, or sweats patient also denies any foreign body sensation, nausea, vision loss, or dizziness.   Past Medical History:  Diagnosis Date  . Complication of anesthesia   . Depression   . Endometriosis   . GERD (gastroesophageal reflux disease)    NO MEDS  . Headache    FREQUENTLY  . Ovarian cyst     Patient Active Problem List   Diagnosis Date Noted  . Endometriosis 10/17/2018  . Adenomyosis 08/17/2018  . Chronic low back pain (Right) without sciatica 02/09/2018  . Elevated sed rate 02/01/2018  . Vitamin D deficiency 02/01/2018  . Chronic sacroiliac joint pain (Right) 02/01/2018  . Other specified dorsopathies, sacral and sacrococcygeal region 02/01/2018  . Chronic abdominal pain (RLQ) (Primary Area of Pain) (Right) 12/26/2017  . Chronic lower extremity pain (Secondary Area of Pain) (Right) 12/26/2017  . Chronic low back pain with right-sided sciatica [facility] Area of Pain) (Bilateral) (R>L) 12/26/2017  . Chronic pain syndrome 12/26/2017  . Opiate use 12/26/2017  . Problems influencing health status 12/26/2017  . Pharmacologic therapy 12/26/2017  . Disorder of skeletal system 12/26/2017    Past Surgical History:  Procedure Laterality Date  . ABLATION    . CESAREAN SECTION    . CYSTOSCOPY N/A 08/24/2018   Procedure: CYSTOSCOPY;  Surgeon: Will Bonnet, MD;  Location: [facility] ORS;  Service: Gynecology;  Laterality: N/A;  . HYSTEROSCOPY WITH NOVASURE N/A 05/27/2015   Procedure: HYSTEROSCOPY  WITH NOVASURE;  Surgeon: Aletha Halim, MD;  Location: [facility] ORS;  Service: Gynecology;  Laterality: N/A;  . LAPAROSCOPY N/A 07/07/2017   Procedure: LAPAROSCOPY DIAGNOSTIC;  Surgeon: Malachy Mood, MD;  Location: [facility] ORS;  Service: Gynecology;  Laterality: N/A;  . ROBOTIC ASSISTED TOTAL HYSTERECTOMY Bilateral 08/24/2018   Procedure: ROBOTIC ASSISTED TOTAL HYSTERECTOMY BILATERAL SALPINGECTOMY;  Surgeon: Will Bonnet, MD;  Location: [facility] ORS;  Service: Gynecology;  Laterality: Bilateral;  . TUBAL LIGATION      Prior to Admission medications   Medication Sig Start Date End Date Taking? Authorizing Provider  ketorolac (ACULAR) 0.5 % ophthalmic solution Place 1 drop into the left eye 4 (four) times daily. 10/02/19   [place]ell[place] Guard, Dannielle Karvonen, PA-C  Multiple Vitamins-Minerals (WOMENS MULTIVITAMIN PO) Take 1 tablet by mouth at bedtime.    [provider]  traMADol (ULTRAM) 50 MG tablet Take 1 tablet (50 mg total) by mouth every 6 (six) hours as needed for severe pain. 02/01/18 08/21/18  Milinda Pointer, MD    Allergies Patient has no known allergies.  Family History  Problem Relation Age of Onset  . Diabetes Mother   . Hypertension Mother   . Heart disease Father   . Cancer Father   . Heart disease Brother     Social History Social History   Tobacco Use  . Smoking status: Former Smoker    Years: 15.00    Types: Cigarettes    Quit date: 06/30/2004    Years since quitting: 15.2  . Smokeless tobacco: Never Used  Substance Use Topics  . Alcohol use: Yes  Comment: OCC  . Drug use: No    Review of Systems  Constitutional: Negative for fever. Eyes: Negative for visual changes.  Reports left eye drainage as above. ENT: Negative for sore throat. Cardiovascular: Negative for chest pain. Respiratory: Negative for shortness of breath. Gastrointestinal: Negative for abdominal pain, vomiting and diarrhea. Genitourinary: Negative for dysuria. Musculoskeletal: Negative  for back pain. Skin: Negative for rash. Neurological: Negative for headaches, focal weakness or numbness. ____________________________________________  PHYSICAL EXAM:  VITAL SIGNS: ED Triage Vitals  Enc Vitals Group     BP 10/02/19 1118 (!) 168/100     Pulse Rate 10/02/19 1118 80     Resp 10/02/19 1118 16     Temp 10/02/19 1118 98.4 F (36.9 C)     Temp Source 10/02/19 1118 Oral     SpO2 10/02/19 1118 96 %     Weight 10/02/19 1116 224 lb (101.6 kg)     Height 10/02/19 1116 5\' 2"  (1.575 m)     Head Circumference --      Peak Flow --      Pain Score 10/02/19 1116 8     Pain Loc --      Pain Edu? --      Excl. in [place]? --     Constitutional: Alert and oriented. Well appearing and in no distress. Head: Normocephalic and atraumatic. Eyes: Conjunctivae are mildly injected on the left. PERRL. Normal extraocular movements. No gross FB noted. No fluorescein dye uptake noted.  Neck: Supple. No thyromegaly. Hematological/Lymphatic/Immunological: No preauricular lymphadenopathy. Cardiovascular: Normal rate, regular rhythm. Normal distal pulses. Respiratory: Normal respiratory effort.  Musculoskeletal: Nontender with normal range of motion in all extremities.  Neurologic:  Normal gait without ataxia. Normal speech and language. No gross focal neurologic deficits are appreciated. ____________________________________________  PROCEDURES  Tetracaine ii gtts OS Erythromycin ophthal ointment - 1 app OS Procedures ____________________________________________  INITIAL IMPRESSION / ASSESSMENT AND PLAN / ED COURSE  Patient with ED evaluation of left eye irritation. Here exam likely represents a viral etiology. There is no evidence of a corneal abrasion or retained FB. She will be treated empirically with antibiotic ointment and Acular for eye irritation. She will follow-up with her provider or return as needed.   Sherry Watkins was evaluated in Emergency Department on 10/02/2019 for the  symptoms described in the history of present illness. She was evaluated in the context of the global COVID-19 pandemic, which necessitated consideration that the patient might be at risk for infection with the SARS-CoV-2 virus that causes COVID-19. Institutional protocols and algorithms that pertain to the evaluation of patients at risk for COVID-19 are in a state of rapid change based on information released by regulatory bodies including the CDC and federal and state organizations. These policies and algorithms were followed during the patient's care in the ED. ____________________________________________  FINAL CLINICAL IMPRESSION(S) / ED DIAGNOSES  Final diagnoses:  Acute viral conjunctivitis of left eye      Carmie End, Dannielle Karvonen, PA-C 10/02/19 1333    Vanessa [place], MD 10/02/19 1402

## 2019-10-02 NOTE — Discharge Instructions (Addendum)
Your exam is consistent with a probable viral conjunctivitis. Use the eye drops as prescribe. Use the ointment as directed.

## 2019-11-06 ENCOUNTER — Emergency Department
Admission: EM | Admit: 2019-11-06 | Discharge: 2019-11-06 | Disposition: A | Discharge status: Home / Self Care | Payer: BC Managed Care – PPO | Attending: Student | Admitting: Student

## 2019-11-06 ENCOUNTER — Encounter: Payer: Self-pay | Admitting: Emergency Medicine

## 2019-11-06 ENCOUNTER — Other Ambulatory Visit: Payer: Self-pay

## 2019-11-06 DIAGNOSIS — Z79899 Other long term (current) drug therapy: Secondary | ICD-10-CM | POA: Diagnosis not present

## 2019-11-06 DIAGNOSIS — Z87891 Personal history of nicotine dependence: Secondary | ICD-10-CM | POA: Diagnosis not present

## 2019-11-06 DIAGNOSIS — L089 Local infection of the skin and subcutaneous tissue, unspecified: Secondary | ICD-10-CM | POA: Diagnosis not present

## 2019-11-06 DIAGNOSIS — T148XXA Other injury of unspecified body region, initial encounter: Secondary | ICD-10-CM

## 2019-11-06 MED ORDER — CEPHALEXIN 500 MG PO CAPS: 500.0000 mg | ORAL_CAPSULE | Freq: Three times a day (TID) | ORAL | 0 refills | Status: DC

## 2019-11-06 NOTE — Discharge Instructions (Addendum)
Follow-up with your primary care provider if any continued problems.  Begin taking antibiotics as directed until completely finished.  You may take Tylenol or ibuprofen with this medication if needed.  Use warm compresses to the area frequently to help with the infection.

## 2019-11-06 NOTE — ED Provider Notes (Signed)
[facility] Emergency Department Provider Note  ____________________________________________   First MD Initiated Contact with Patient 11/06/19 [phone]     (approximate)  I have reviewed the triage vital signs and the nursing notes.   HISTORY  Chief Complaint Piercing infected   HPI Sherry Watkins is a 38 y.o. female presents to the ED with complaint of possible infection to a piercing in her left cheek.  Patient states that there is been no drainage.  She had  area in the left temporal area pierced in August.  She states that she is been trying to clean it at the base.  She states she was mad because her boyfriend made her come today.  She denies any fever or chills.  She rates her pain as 5 out of 10.      Past Medical History:  Diagnosis Date  . Complication of anesthesia   . Depression   . Endometriosis   . GERD (gastroesophageal reflux disease)    NO MEDS  . Headache    FREQUENTLY  . Ovarian cyst     Patient Active Problem List   Diagnosis Date Noted  . Endometriosis 10/17/2018  . Adenomyosis 08/17/2018  . Chronic low back pain (Right) without sciatica 02/09/2018  . Elevated sed rate 02/01/2018  . Vitamin D deficiency 02/01/2018  . Chronic sacroiliac joint pain (Right) 02/01/2018  . Other specified dorsopathies, sacral and sacrococcygeal region 02/01/2018  . Chronic abdominal pain (RLQ) (Primary Area of Pain) (Right) 12/26/2017  . Chronic lower extremity pain (Secondary Area of Pain) (Right) 12/26/2017  . Chronic low back pain with right-sided sciatica [facility] Area of Pain) (Bilateral) (R>L) 12/26/2017  . Chronic pain syndrome 12/26/2017  . Opiate use 12/26/2017  . Problems influencing health status 12/26/2017  . Pharmacologic therapy 12/26/2017  . Disorder of skeletal system 12/26/2017    Past Surgical History:  Procedure Laterality Date  . ABLATION    . CESAREAN SECTION    . CYSTOSCOPY N/A 08/24/2018   Procedure: CYSTOSCOPY;   Surgeon: Will Bonnet, MD;  Location: [facility] ORS;  Service: Gynecology;  Laterality: N/A;  . HYSTEROSCOPY WITH NOVASURE N/A 05/27/2015   Procedure: HYSTEROSCOPY WITH NOVASURE;  Surgeon: Aletha Halim, MD;  Location: [facility] ORS;  Service: Gynecology;  Laterality: N/A;  . LAPAROSCOPY N/A 07/07/2017   Procedure: LAPAROSCOPY DIAGNOSTIC;  Surgeon: Malachy Mood, MD;  Location: [facility] ORS;  Service: Gynecology;  Laterality: N/A;  . ROBOTIC ASSISTED TOTAL HYSTERECTOMY Bilateral 08/24/2018   Procedure: ROBOTIC ASSISTED TOTAL HYSTERECTOMY BILATERAL SALPINGECTOMY;  Surgeon: Will Bonnet, MD;  Location: [facility] ORS;  Service: Gynecology;  Laterality: Bilateral;  . TUBAL LIGATION      Prior to Admission medications   Medication Sig Start Date End Date Taking? Authorizing Provider  cephALEXin (KEFLEX) 500 MG capsule Take 1 capsule (500 mg total) by mouth 3 (three) times daily. 11/06/19   Johnn Hai, PA-C  ketorolac (ACULAR) 0.5 % ophthalmic solution Place 1 drop into the left eye 4 (four) times daily. 10/02/19   Menshew, Dannielle Karvonen, PA-C  Multiple Vitamins-Minerals (WOMENS MULTIVITAMIN PO) Take 1 tablet by mouth at bedtime.    [provider]  traMADol (ULTRAM) 50 MG tablet Take 1 tablet (50 mg total) by mouth every 6 (six) hours as needed for severe pain. 02/01/18 08/21/18  Milinda Pointer, MD    Allergies Patient has no known allergies.  Family History  Problem Relation Age of Onset  . Diabetes Mother   . Hypertension Mother   .  Heart disease Father   . Cancer Father   . Heart disease Brother     Social History Social History   Tobacco Use  . Smoking status: Former Smoker    Years: 15.00    Types: Cigarettes    Quit date: 06/30/2004    Years since quitting: 15.3  . Smokeless tobacco: Never Used  Substance Use Topics  . Alcohol use: Yes    Comment: OCC  . Drug use: No    Review of Systems Constitutional: No fever/chills Eyes: No visual changes. ENT: No  sore throat. Cardiovascular: Denies chest pain. Respiratory: Denies shortness of breath. Musculoskeletal: Negative for back pain. Skin: Possible skin infection. Neurological: Negative for headaches, focal weakness or numbness. ____________________________________________   PHYSICAL EXAM:  VITAL SIGNS: ED Triage Vitals  Enc Vitals Group     BP 11/06/19 0723 (!) 168/95     Pulse Rate 11/06/19 0723 79     Resp 11/06/19 0723 19     Temp 11/06/19 0723 98.6 F (37 C)     Temp Source 11/06/19 0723 Oral     SpO2 11/06/19 0723 100 %     Weight 11/06/19 0723 225 lb (102.1 kg)     Height 11/06/19 0723 5\' 2"  (1.575 m)     Head Circumference --      Peak Flow --      Pain Score 11/06/19 0741 5     Pain Loc --      Pain Edu? --      Excl. in [place]? --    Constitutional: Alert and oriented. Well appearing and in no acute distress. Eyes: Conjunctivae are normal.  Head: Atraumatic. Neck: No stridor.   Cardiovascular: Normal rate, regular rhythm. Grossly normal heart sounds.  Good peripheral circulation. Respiratory: Normal respiratory effort.  No retractions. Lungs CTAB. Neurologic:  Normal speech and language. No gross focal neurologic deficits are appreciated. No gait instability. Skin:  Skin is warm, dry and intact.  There is a single pierced stud in the patient's face left side which at the base is erythematous and tender to touch.  No active drainage is noted.  Area suspicious for early infection. Psychiatric: Mood and affect are normal. Speech and behavior are normal.  ____________________________________________   LABS (all labs ordered are listed, but only abnormal results are displayed)  Labs Reviewed - No data to display  PROCEDURES  Procedure(s) performed (including Critical Care):  Procedures  ____________________________________________   INITIAL IMPRESSION / ASSESSMENT AND PLAN / ED COURSE  As part of my medical decision making, I reviewed the following data within  the electronic MEDICAL RECORD NUMBER Notes from prior ED visits and [place] Controlled Substance Database  38 year old female presents to the ED with complaint of possible infection at the piercing on her left cheek.  She denies any drainage from the area has been trying to clean it at.  Patient states this piercing was placed in August and she has cleaned it without any difficulty until recently when her boyfriend told her that it was infected.  She denies any fever or chills.  There is been no active drainage.  Area is tender to palpation and erythematous at the base of the piercing.  This is suspicious for an early infection.  Patient was made aware.  A prescription for Keflex 500 mg 3 times daily was sent to her pharmacy.  Patient states that after leaving the emergency department she plans on going to the tattoo shop where she had this done  to have this removed.  ____________________________________________   FINAL CLINICAL IMPRESSION(S) / ED DIAGNOSES  Final diagnoses:  Superficial skin infection  Piercing     ED Discharge Orders         Ordered    cephALEXin (KEFLEX) 500 MG capsule  3 times daily     11/06/19 [id number]           Note:  This document was prepared using Dragon voice recognition software and may include unintentional dictation errors.    Johnn Hai, PA-C 11/06/19 1324    Lilia Pro., MD 11/06/19 [phone]

## 2019-11-06 NOTE — ED Triage Notes (Signed)
Pt here with c/o piercing inflammation and soreness for the past few days, wants to make sure it's not infected, piercing to left temple area. Was placed in August. No drainage noted.

## 2019-11-06 NOTE — ED Notes (Signed)
See triage note  Presents with possible infection to piercing to left cheek  Min swelling noted no redness or drainage

## 2020-01-20 ENCOUNTER — Emergency Department
Admission: EM | Admit: 2020-01-20 | Discharge: 2020-01-20 | Disposition: A | Discharge status: Home / Self Care | Payer: HRSA Program | Attending: Emergency Medicine | Admitting: Emergency Medicine

## 2020-01-20 ENCOUNTER — Encounter: Payer: Self-pay | Admitting: Emergency Medicine

## 2020-01-20 ENCOUNTER — Other Ambulatory Visit: Payer: Self-pay

## 2020-01-20 DIAGNOSIS — Z79899 Other long term (current) drug therapy: Secondary | ICD-10-CM | POA: Diagnosis not present

## 2020-01-20 DIAGNOSIS — R112 Nausea with vomiting, unspecified: Secondary | ICD-10-CM | POA: Insufficient documentation

## 2020-01-20 DIAGNOSIS — R438 Other disturbances of smell and taste: Secondary | ICD-10-CM | POA: Diagnosis not present

## 2020-01-20 DIAGNOSIS — R202 Paresthesia of skin: Secondary | ICD-10-CM | POA: Insufficient documentation

## 2020-01-20 DIAGNOSIS — R200 Anesthesia of skin: Secondary | ICD-10-CM | POA: Diagnosis not present

## 2020-01-20 DIAGNOSIS — R6883 Chills (without fever): Secondary | ICD-10-CM | POA: Diagnosis not present

## 2020-01-20 DIAGNOSIS — M7918 Myalgia, other site: Secondary | ICD-10-CM | POA: Diagnosis present

## 2020-01-20 DIAGNOSIS — Z87891 Personal history of nicotine dependence: Secondary | ICD-10-CM | POA: Insufficient documentation

## 2020-01-20 DIAGNOSIS — U071 COVID-19: Secondary | ICD-10-CM | POA: Insufficient documentation

## 2020-01-20 MED ORDER — ONDANSETRON 4 MG PO TBDP: 4.0000 mg | ORAL_TABLET | Freq: Three times a day (TID) | ORAL | 0 refills | Status: DC | PRN

## 2020-01-20 MED ORDER — ONDANSETRON 4 MG PO TBDP
4.0000 mg | ORAL_TABLET | Freq: Once | ORAL | Status: AC
  Administered 2020-01-20: 4 mg via ORAL
  Filled 2020-01-20: qty 1

## 2020-01-20 MED ORDER — BENZONATATE 100 MG PO CAPS: ORAL_CAPSULE | ORAL | 0 refills | Status: DC

## 2020-01-20 NOTE — Discharge Instructions (Addendum)
You are being held out of work pending your Covid results.  Take the prescription cough medicine and nausea medicine as needed.  Remain in house quarantine until results available.  You will be notified by telephone only, if results are positive.  Return to the ED or see your primary provider as needed.

## 2020-01-20 NOTE — ED Triage Notes (Signed)
Pt to ER states lost taste and smell yesterday.  Pt initially denies other s/s.  When symptoms are listed individually pt states yes to nausea and diarrhea and then states had a headache several days ago.

## 2020-01-20 NOTE — ED Triage Notes (Signed)
FIRST NURSE NOTE:  Pt states when asked what brings her to the ED, "No smell or taste, I need to be tested"  No distress noted on arrival. Pt ambulatory with steady gait.

## 2020-01-20 NOTE — ED Provider Notes (Signed)
[facility] Emergency Department Provider Note ____________________________________________  Time seen: 45  I have reviewed the triage vital signs and the nursing notes.  HISTORY  Chief Complaint  Covid Exposure  HPI Sherry Watkins is a 39 y.o. female presents her self to the ED for evaluation of symptoms related to Covid exposure.  Patient describes that her 2 older daughters have both contracted Covid over the last 2 weeks.  She describes that 2 days ago she began to experience myalgias, chills, nausea, and vomiting.  She describes today sudden onset of loss of her smell and taste sensation.  She does admit to taking ecstasy last night, and since that time is experienced numbness and tingling to her face and hands.  She denies any paresthesias, weakness, facial droop, chest pain, or syncope.  She does admit to being aware of her heart racing, and feeling what she describes as heartburn.  Patient has had 3 episodes of clear watery, emesis during the interview.  Past Medical History:  Diagnosis Date  . Complication of anesthesia   . Depression   . Endometriosis   . GERD (gastroesophageal reflux disease)    NO MEDS  . Headache    FREQUENTLY  . Ovarian cyst     Patient Active Problem List   Diagnosis Date Noted  . Endometriosis 10/17/2018  . Adenomyosis 08/17/2018  . Chronic low back pain (Right) without sciatica 02/09/2018  . Elevated sed rate 02/01/2018  . Vitamin D deficiency 02/01/2018  . Chronic sacroiliac joint pain (Right) 02/01/2018  . Other specified dorsopathies, sacral and sacrococcygeal region 02/01/2018  . Chronic abdominal pain (RLQ) (Primary Area of Pain) (Right) 12/26/2017  . Chronic lower extremity pain (Secondary Area of Pain) (Right) 12/26/2017  . Chronic low back pain with right-sided sciatica [facility] Area of Pain) (Bilateral) (R>L) 12/26/2017  . Chronic pain syndrome 12/26/2017  . Opiate use 12/26/2017  . Problems influencing  health status 12/26/2017  . Pharmacologic therapy 12/26/2017  . Disorder of skeletal system 12/26/2017    Past Surgical History:  Procedure Laterality Date  . ABLATION    . CESAREAN SECTION    . CYSTOSCOPY N/A 08/24/2018   Procedure: CYSTOSCOPY;  Surgeon: Will Bonnet, MD;  Location: [facility] ORS;  Service: Gynecology;  Laterality: N/A;  . HYSTEROSCOPY WITH NOVASURE N/A 05/27/2015   Procedure: HYSTEROSCOPY WITH NOVASURE;  Surgeon: Aletha Halim, MD;  Location: [facility] ORS;  Service: Gynecology;  Laterality: N/A;  . LAPAROSCOPY N/A 07/07/2017   Procedure: LAPAROSCOPY DIAGNOSTIC;  Surgeon: Malachy Mood, MD;  Location: [facility] ORS;  Service: Gynecology;  Laterality: N/A;  . ROBOTIC ASSISTED TOTAL HYSTERECTOMY Bilateral 08/24/2018   Procedure: ROBOTIC ASSISTED TOTAL HYSTERECTOMY BILATERAL SALPINGECTOMY;  Surgeon: Will Bonnet, MD;  Location: [facility] ORS;  Service: Gynecology;  Laterality: Bilateral;  . TUBAL LIGATION      Prior to Admission medications   Medication Sig Start Date End Date Taking? Authorizing Provider  benzonatate (TESSALON PERLES) 100 MG capsule Take 1-2 tabs TID prn cough 01/20/20   Juana Haralson, Dannielle Karvonen, PA-C  Multiple Vitamins-Minerals (WOMENS MULTIVITAMIN PO) Take 1 tablet by mouth at bedtime.    [provider]  ondansetron (ZOFRAN ODT) 4 MG disintegrating tablet Take 1 tablet (4 mg total) by mouth every 8 (eight) hours as needed. 01/20/20   Kosisochukwu [place], Dannielle Karvonen, PA-C  traMADol (ULTRAM) 50 MG tablet Take 1 tablet (50 mg total) by mouth every 6 (six) hours as needed for severe pain. 02/01/18 08/21/18  Milinda Pointer, MD  Allergies Patient has no known allergies.  Family History  Problem Relation Age of Onset  . Diabetes Mother   . Hypertension Mother   . Heart disease Father   . Cancer Father   . Heart disease Brother     Social History Social History   Tobacco Use  . Smoking status: Former Smoker    Years: 15.00    Types: Cigarettes     Quit date: 06/30/2004    Years since quitting: 15.5  . Smokeless tobacco: Never Used  Substance Use Topics  . Alcohol use: Yes    Comment: OCC  . Drug use: No    Review of Systems  Constitutional: Negative for fever. Eyes: Negative for visual changes. ENT: Negative for sore throat. Cardiovascular: Negative for chest pain. Respiratory: Negative for shortness of breath.  Reports cough. Gastrointestinal: Negative for abdominal pain, and diarrhea.  Reports emesis. Genitourinary: Negative for dysuria. Musculoskeletal: Negative for back pain. Skin: Negative for rash. Neurological: Negative for headaches, focal weakness or numbness.  Reports tingling in her face and hands. ____________________________________________  PHYSICAL EXAM:  VITAL SIGNS: ED Triage Vitals  Enc Vitals Group     BP 01/20/20 1833 (!) 161/99     Pulse Rate 01/20/20 1833 (!) 114     Resp 01/20/20 1833 16     Temp 01/20/20 1833 98.5 F (36.9 C)     Temp Source 01/20/20 1833 Oral     SpO2 01/20/20 1833 98 %     Weight 01/20/20 1833 234 lb (106.1 kg)     Height 01/20/20 1833 5\' 2"  (1.575 m)     Head Circumference --      Peak Flow --      Pain Score 01/20/20 1834 0     Pain Loc --      Pain Edu? --      Excl. in [place]? --     Constitutional: Alert and oriented. Well appearing and in no distress. Head: Normocephalic and atraumatic. Eyes: Conjunctivae are normal. PERRL. Normal extraocular movements Ears: Canals clear. TMs intact bilaterally. Nose: No congestion/rhinorrhea/epistaxis. Mouth/Throat: Mucous membranes are moist. Neck: Supple. No thyromegaly. Hematological/Lymphatic/Immunological: No cervical lymphadenopathy. Cardiovascular: Normal rate, regular rhythm. Normal distal pulses. Respiratory: Normal respiratory effort. No wheezes/rales/rhonchi. Gastrointestinal: Soft and nontender. No distention. Musculoskeletal: Nontender with normal range of motion in all extremities.  Neurologic:  Normal gait  without ataxia. Normal speech and language. No gross focal neurologic deficits are appreciated. Skin:  Skin is warm, dry and intact. No rash noted. Psychiatric: Mood and affect are normal. Patient exhibits appropriate insight and judgment. ____________________________________________  PROCEDURES  Zofran 4 mg ODT Procedures ____________________________________________  INITIAL IMPRESSION / ASSESSMENT AND PLAN / ED COURSE  Patient with ED evaluation of symptoms concerning for Covid based on her clinical presentation as well as her recent close contact.  Patient describes loss of taste and smell sensation yesterday.  She is also had cough, malaise, and nausea.  Patient does admit to taking ecstasy last night, has had intermittent nausea, vomiting, and tingling in the interim.  Her exam today is benign overall reassuring.  She is declined any blood work or IV fluid hydration at this time.  She will be treated with nausea medicine for symptoms, and is encouraged to take over-the-counter cough and allergy medicines for symptom relief.  Return precautions have been reviewed.  Patient will be out of work for results to report.   Sherry Watkins was evaluated in Emergency Department on 01/20/2020 for the symptoms  described in the history of present illness. She was evaluated in the context of the global COVID-19 pandemic, which necessitated consideration that the patient might be at risk for infection with the SARS-CoV-2 virus that causes COVID-19. Institutional protocols and algorithms that pertain to the evaluation of patients at risk for COVID-19 are in a state of rapid change based on information released by regulatory bodies including the CDC and federal and state organizations. These policies and algorithms were followed during the patient's care in the ED. ____________________________________________  FINAL CLINICAL IMPRESSION(S) / ED DIAGNOSES  Final diagnoses:  Clinical diagnosis of COVID-19       Melvenia Needles, PA-C 01/20/20 1934    Drenda Freeze, MD 01/20/20 [phone]

## 2020-01-22 ENCOUNTER — Telehealth: Payer: Self-pay | Admitting: Emergency Medicine

## 2020-01-22 LAB — NOVEL CORONAVIRUS, NAA (HOSP ORDER, SEND-OUT TO REF LAB; TAT 18-24 HRS): SARS-CoV-2, NAA: DETECTED — AB

## 2020-01-22 NOTE — Telephone Encounter (Signed)
Called to assure patient is aware of covid positive result.  Left message.

## 2020-01-23 ENCOUNTER — Telehealth: Payer: Self-pay | Admitting: Adult Health

## 2020-01-23 ENCOUNTER — Telehealth: Payer: Self-pay | Admitting: Unknown Physician Specialty

## 2020-01-23 NOTE — Telephone Encounter (Signed)
Called to discuss with patient about positive SARS-CoV-2 test, Covid symptoms and the use of bamlanivimab, a monoclonal antibody infusion for those with mild to moderate Covid symptoms and at a high risk of hospitalization.  Pt is qualified for this infusion at the [facility] infusion center due to [employer] left to call back.  MyChart message previously sent to pt.  Mina Marble, NP-C

## 2020-01-23 NOTE — Telephone Encounter (Signed)
Called to discuss with patient about Covid symptoms and the use of bamlanivimab, a monoclonal antibody infusion for those with mild to moderate Covid symptoms and at a high risk of hospitalization.  Pt is qualified for this infusion at the [facility] infusion center due to [employer] left to call back

## 2020-02-25 ENCOUNTER — Other Ambulatory Visit: Payer: Self-pay

## 2020-02-25 ENCOUNTER — Emergency Department
Admission: EM | Admit: 2020-02-25 | Discharge: 2020-02-25 | Disposition: A | Discharge status: Home / Self Care | Payer: BC Managed Care – PPO | Attending: Emergency Medicine | Admitting: Emergency Medicine

## 2020-02-25 DIAGNOSIS — R519 Headache, unspecified: Secondary | ICD-10-CM | POA: Insufficient documentation

## 2020-02-25 DIAGNOSIS — I10 Essential (primary) hypertension: Secondary | ICD-10-CM | POA: Insufficient documentation

## 2020-02-25 DIAGNOSIS — Z79899 Other long term (current) drug therapy: Secondary | ICD-10-CM | POA: Insufficient documentation

## 2020-02-25 DIAGNOSIS — Z87891 Personal history of nicotine dependence: Secondary | ICD-10-CM | POA: Diagnosis not present

## 2020-02-25 LAB — URINALYSIS, COMPLETE (UACMP) WITH MICROSCOPIC
Bacteria, UA: NONE SEEN
Bilirubin Urine: NEGATIVE
Glucose, UA: NEGATIVE mg/dL
Hgb urine dipstick: NEGATIVE
Ketones, ur: NEGATIVE mg/dL
Leukocytes,Ua: NEGATIVE
Nitrite: NEGATIVE
Protein, ur: 30 mg/dL — AB
Specific Gravity, Urine: 1.015 (ref 1.005–1.030)
pH: 7 (ref 5.0–8.0)

## 2020-02-25 LAB — BASIC METABOLIC PANEL
Anion gap: 8 (ref 5–15)
BUN: 8 mg/dL (ref 6–20)
CO2: 24 mmol/L (ref 22–32)
Calcium: 9.3 mg/dL (ref 8.9–10.3)
Chloride: 105 mmol/L (ref 98–111)
Creatinine, Ser: 0.74 mg/dL (ref 0.44–1.00)
GFR calc Af Amer: >60 mL/min (ref >60)
GFR calc non Af Amer: >60 mL/min (ref >60)
Glucose, Bld: 96 mg/dL (ref 70–99)
Potassium: 3.8 mmol/L (ref 3.5–5.1)
Sodium: 137 mmol/L (ref 135–145)

## 2020-02-25 LAB — CBC
HCT: 42.9 % (ref 36.0–46.0)
Hemoglobin: 14.6 g/dL (ref 12.0–15.0)
MCH: 29.7 pg (ref 26.0–34.0)
MCHC: 34 g/dL (ref 30.0–36.0)
MCV: 87.4 fL (ref 80.0–100.0)
Platelets: 372 10*3/uL (ref 150–400)
RBC: 4.91 MIL/uL (ref 3.87–5.11)
RDW: 12.8 % (ref 11.5–15.5)
WBC: 9.1 10*3/uL (ref 4.0–10.5)
nRBC: 0 % (ref 0.0–0.2)

## 2020-02-25 LAB — GLUCOSE, CAPILLARY: Glucose-Capillary: 87 mg/dL (ref 70–99)

## 2020-02-25 LAB — POCT PREGNANCY, URINE: Preg Test, Ur: NEGATIVE

## 2020-02-25 MED ORDER — HYDROCHLOROTHIAZIDE 25 MG PO TABS
25.0000 mg | ORAL_TABLET | Freq: Every day | ORAL | Status: DC
  Administered 2020-02-25: 25 mg via ORAL
  Filled 2020-02-25: qty 1

## 2020-02-25 MED ORDER — SODIUM CHLORIDE 0.9% FLUSH
3.0000 mL | Freq: Once | INTRAVENOUS | Status: AC
  Administered 2020-02-25: 14:00:00 3 mL via INTRAVENOUS

## 2020-02-25 MED ORDER — KETOROLAC TROMETHAMINE 30 MG/ML IJ SOLN
30.0000 mg | Freq: Once | INTRAMUSCULAR | Status: AC
  Administered 2020-02-25: 13:00:00 30 mg via INTRAVENOUS
  Filled 2020-02-25: qty 1

## 2020-02-25 MED ORDER — HYDROCHLOROTHIAZIDE 25 MG PO TABS: 25.0000 mg | ORAL_TABLET | Freq: Every day | ORAL | 1 refills | Status: DC

## 2020-02-25 MED ORDER — METOCLOPRAMIDE HCL 5 MG/ML IJ SOLN
10.0000 mg | Freq: Once | INTRAMUSCULAR | Status: AC
  Administered 2020-02-25: 13:00:00 10 mg via INTRAVENOUS
  Filled 2020-02-25: qty 2

## 2020-02-25 NOTE — ED Triage Notes (Signed)
Pt comes into the ED via EMS from [place], states she was there for a follow up for HTN, and they reported 240/140. EMS 200/120, nitro SL x1 188/97. 324mg ASA, #20LAC. Denies any sx other then a HA.

## 2020-02-25 NOTE — ED Provider Notes (Signed)
[facility] Emergency Department Provider Note       Time seen: ----------------------------------------- 1:25 PM on 02/25/2020 -----------------------------------------   I have reviewed the triage vital signs and the nursing notes.  HISTORY   Chief Complaint No chief complaint on file.   HPI Sherry Watkins is a 39 y.o. female with a history of depression, endometriosis who presents to the ED for hypertension.  Patient comes from [facility] with elevated blood pressure.  She states she was having some headache earlier which is not unusual for her.  Discomfort is 8 out of 10.  She has no other complaints, denies any chest pain.  Elevated blood pressure in the outpatient setting was concerning so she was sent here.  Past Medical History:  Diagnosis Date  . Complication of anesthesia   . Depression   . Endometriosis   . GERD (gastroesophageal reflux disease)    NO MEDS  . Headache    FREQUENTLY  . Ovarian cyst     Patient Active Problem List   Diagnosis Date Noted  . Endometriosis 10/17/2018  . Adenomyosis 08/17/2018  . Chronic low back pain (Right) without sciatica 02/09/2018  . Elevated sed rate 02/01/2018  . Vitamin D deficiency 02/01/2018  . Chronic sacroiliac joint pain (Right) 02/01/2018  . Other specified dorsopathies, sacral and sacrococcygeal region 02/01/2018  . Chronic abdominal pain (RLQ) (Primary Area of Pain) (Right) 12/26/2017  . Chronic lower extremity pain (Secondary Area of Pain) (Right) 12/26/2017  . Chronic low back pain with right-sided sciatica [facility] Area of Pain) (Bilateral) (R>L) 12/26/2017  . Chronic pain syndrome 12/26/2017  . Opiate use 12/26/2017  . Problems influencing health status 12/26/2017  . Pharmacologic therapy 12/26/2017  . Disorder of skeletal system 12/26/2017    Past Surgical History:  Procedure Laterality Date  . ABLATION    . CESAREAN SECTION    . CYSTOSCOPY N/A 08/24/2018   Procedure:  CYSTOSCOPY;  Surgeon: Will Bonnet, MD;  Location: [facility] ORS;  Service: Gynecology;  Laterality: N/A;  . HYSTEROSCOPY WITH NOVASURE N/A 05/27/2015   Procedure: HYSTEROSCOPY WITH NOVASURE;  Surgeon: Aletha Halim, MD;  Location: [facility] ORS;  Service: Gynecology;  Laterality: N/A;  . LAPAROSCOPY N/A 07/07/2017   Procedure: LAPAROSCOPY DIAGNOSTIC;  Surgeon: Malachy Mood, MD;  Location: [facility] ORS;  Service: Gynecology;  Laterality: N/A;  . ROBOTIC ASSISTED TOTAL HYSTERECTOMY Bilateral 08/24/2018   Procedure: ROBOTIC ASSISTED TOTAL HYSTERECTOMY BILATERAL SALPINGECTOMY;  Surgeon: Will Bonnet, MD;  Location: [facility] ORS;  Service: Gynecology;  Laterality: Bilateral;  . TUBAL LIGATION      Allergies Patient has no known allergies.  Social History Social History   Tobacco Use  . Smoking status: Former Smoker    Years: 15.00    Types: Cigarettes    Quit date: 06/30/2004    Years since quitting: 15.6  . Smokeless tobacco: Never Used  Substance Use Topics  . Alcohol use: Yes    Comment: OCC  . Drug use: No    Review of Systems Constitutional: Negative for fever. Cardiovascular: Negative for chest pain. Respiratory: Negative for shortness of breath. Gastrointestinal: Negative for abdominal pain, vomiting and diarrhea. Musculoskeletal: Negative for back pain. Skin: Negative for rash. Neurological: Positive for headache  All systems negative/normal/unremarkable except as stated in the HPI  ____________________________________________   PHYSICAL EXAM:  VITAL SIGNS: ED Triage Vitals  Enc Vitals Group     BP 02/25/20 1108 (!) 178/120     Pulse Rate 02/25/20 1108 74  Resp 02/25/20 1108 18     Temp 02/25/20 1112 98 F (36.7 C)     Temp Source 02/25/20 1108 Oral     SpO2 02/25/20 1108 98 %     Weight 02/25/20 1107 235 lb (106.6 kg)     Height 02/25/20 1107 5\' 2"  (1.575 m)     Head Circumference --      Peak Flow --      Pain Score 02/25/20 1107 8     Pain Loc --       Pain Edu? --      Excl. in [place]? --     Constitutional: Alert and oriented. Well appearing and in no distress. Eyes: Conjunctivae are normal. Normal extraocular movements. Cardiovascular: Normal rate, regular rhythm. No murmurs, rubs, or gallops. Respiratory: Normal respiratory effort without tachypnea nor retractions. Breath sounds are clear and equal bilaterally. No wheezes/rales/rhonchi. Gastrointestinal: Soft and nontender. Normal bowel sounds Musculoskeletal: Nontender with normal range of motion in extremities. No lower extremity tenderness nor edema. Neurologic:  Normal speech and language. No gross focal neurologic deficits are appreciated.  Skin:  Skin is warm, dry and intact. No rash noted. Psychiatric: Mood and affect are normal. Speech and behavior are normal.  ____________________________________________  EKG: Interpreted by me.  Sinus rhythm with rate of 83 bpm, normal PR interval, normal QRS, normal QT  ____________________________________________  ED COURSE:  As part of my medical decision making, I reviewed the following data within the [place] History obtained from family if available, nursing notes, old chart and ekg, as well as notes from prior ED visits. Patient presented for hypertension, we will assess with labs as indicated at this time.   Procedures  Sherry Watkins was evaluated in Emergency Department on 02/25/2020 for the symptoms described in the history of present illness. She was evaluated in the context of the global COVID-19 pandemic, which necessitated consideration that the patient might be at risk for infection with the SARS-CoV-2 virus that causes COVID-19. Institutional protocols and algorithms that pertain to the evaluation of patients at risk for COVID-19 are in a state of rapid change based on information released by regulatory bodies including the CDC and federal and state organizations. These policies and algorithms were followed  during the patient's care in the ED.  ____________________________________________   LABS (pertinent positives/negatives)  Labs Reviewed  URINALYSIS, COMPLETE (UACMP) WITH MICROSCOPIC - Abnormal; Notable for the following components:      Result Value   Color, Urine YELLOW (*)    APPearance CLEAR (*)    Protein, ur 30 (*)    All other components within normal limits  CBC  GLUCOSE, CAPILLARY  BASIC METABOLIC PANEL  CBG MONITORING, ED  POC URINE PREG, ED  POCT PREGNANCY, URINE  ____________________________________________   DIFFERENTIAL DIAGNOSIS   Asymptomatic hypertension, hypertensive urgency, migraine headache, tension headache  FINAL ASSESSMENT AND PLAN  Hypertension  Plan: The patient had presented for asymptomatic hypertension. Patient's labs are unremarkable.  Patient was started on HCTZ, is cleared for outpatient follow-up.   Laurence Aly, MD    Note: This note was generated in part or whole with voice recognition software. Voice recognition is usually quite accurate but there are transcription errors that can and very often do occur. I apologize for any typographical errors that were not detected and corrected.     Earleen Newport, MD 02/25/20 [phone]

## 2020-02-25 NOTE — ED Notes (Signed)
Pt present to the ED for HTN. Pt states she was at the doctor today to get it medicines for it and they sent her here. Pt c/o headache.

## 2020-02-27 ENCOUNTER — Emergency Department: Payer: BC Managed Care – PPO

## 2020-02-27 ENCOUNTER — Other Ambulatory Visit: Payer: Self-pay

## 2020-02-27 ENCOUNTER — Emergency Department
Admission: EM | Admit: 2020-02-27 | Discharge: 2020-02-27 | Disposition: A | Discharge status: Home / Self Care | Payer: BC Managed Care – PPO | Attending: Student in an Organized Health Care Education/Training Program | Admitting: Student in an Organized Health Care Education/Training Program

## 2020-02-27 ENCOUNTER — Encounter: Payer: Self-pay | Admitting: Emergency Medicine

## 2020-02-27 DIAGNOSIS — R0789 Other chest pain: Secondary | ICD-10-CM | POA: Diagnosis present

## 2020-02-27 DIAGNOSIS — I10 Essential (primary) hypertension: Secondary | ICD-10-CM | POA: Diagnosis not present

## 2020-02-27 DIAGNOSIS — R079 Chest pain, unspecified: Secondary | ICD-10-CM

## 2020-02-27 DIAGNOSIS — Z87891 Personal history of nicotine dependence: Secondary | ICD-10-CM | POA: Insufficient documentation

## 2020-02-27 DIAGNOSIS — Z79899 Other long term (current) drug therapy: Secondary | ICD-10-CM | POA: Diagnosis not present

## 2020-02-27 DIAGNOSIS — K449 Diaphragmatic hernia without obstruction or gangrene: Secondary | ICD-10-CM | POA: Diagnosis not present

## 2020-02-27 LAB — TROPONIN I (HIGH SENSITIVITY)
Troponin I (High Sensitivity): 5 ng/L (ref <18)
Troponin I (High Sensitivity): 5 ng/L (ref <18)

## 2020-02-27 LAB — CBC
HCT: 46 % (ref 36.0–46.0)
Hemoglobin: 15.6 g/dL — ABNORMAL HIGH (ref 12.0–15.0)
MCH: 29.7 pg (ref 26.0–34.0)
MCHC: 33.9 g/dL (ref 30.0–36.0)
MCV: 87.5 fL (ref 80.0–100.0)
Platelets: 424 10*3/uL — ABNORMAL HIGH (ref 150–400)
RBC: 5.26 MIL/uL — ABNORMAL HIGH (ref 3.87–5.11)
RDW: 12.9 % (ref 11.5–15.5)
WBC: 11.7 10*3/uL — ABNORMAL HIGH (ref 4.0–10.5)
nRBC: 0 % (ref 0.0–0.2)

## 2020-02-27 LAB — BASIC METABOLIC PANEL
Anion gap: 14 (ref 5–15)
BUN: 9 mg/dL (ref 6–20)
CO2: 26 mmol/L (ref 22–32)
Calcium: 9.5 mg/dL (ref 8.9–10.3)
Chloride: 94 mmol/L — ABNORMAL LOW (ref 98–111)
Creatinine, Ser: 0.8 mg/dL (ref 0.44–1.00)
GFR calc Af Amer: >60 mL/min (ref >60)
GFR calc non Af Amer: >60 mL/min (ref >60)
Glucose, Bld: 124 mg/dL — ABNORMAL HIGH (ref 70–99)
Potassium: 2.7 mmol/L — CL (ref 3.5–5.1)
Sodium: 134 mmol/L — ABNORMAL LOW (ref 135–145)

## 2020-02-27 LAB — FIBRIN DERIVATIVES D-DIMER (ARMC ONLY): Fibrin derivatives D-dimer (ARMC): 512.81 ng/mL (FEU) — ABNORMAL HIGH (ref 0.00–499.00)

## 2020-02-27 MED ORDER — SODIUM CHLORIDE 0.9% FLUSH: 3.0000 mL | Freq: Once | INTRAVENOUS | Status: DC

## 2020-02-27 MED ORDER — POTASSIUM CHLORIDE CRYS ER 10 MEQ PO TBCR: 10.0000 meq | EXTENDED_RELEASE_TABLET | Freq: Every day | ORAL | 0 refills | Status: DC

## 2020-02-27 MED ORDER — FAMOTIDINE 20 MG PO TABS: 20.0000 mg | ORAL_TABLET | Freq: Two times a day (BID) | ORAL | 1 refills | Status: DC

## 2020-02-27 MED ORDER — IOHEXOL 350 MG/ML SOLN
75.0000 mL | Freq: Once | INTRAVENOUS | Status: AC | PRN
  Administered 2020-02-27: 75 mL via INTRAVENOUS

## 2020-02-27 MED ORDER — LIDOCAINE VISCOUS HCL 2 % MT SOLN: 15.0000 mL | Freq: Once | OROMUCOSAL | Status: DC

## 2020-02-27 MED ORDER — HYDROCODONE-ACETAMINOPHEN 5-325 MG PO TABS
1.0000 | ORAL_TABLET | Freq: Once | ORAL | Status: AC
  Administered 2020-02-27: 18:00:00 1 via ORAL
  Filled 2020-02-27: qty 1

## 2020-02-27 MED ORDER — AMLODIPINE BESYLATE 5 MG PO TABS
10.0000 mg | ORAL_TABLET | Freq: Once | ORAL | Status: AC
  Administered 2020-02-27: 10 mg via ORAL
  Filled 2020-02-27: qty 2

## 2020-02-27 MED ORDER — POTASSIUM CHLORIDE CRYS ER 20 MEQ PO TBCR
40.0000 meq | EXTENDED_RELEASE_TABLET | Freq: Once | ORAL | Status: AC
  Administered 2020-02-27: 18:00:00 40 meq via ORAL
  Filled 2020-02-27: qty 2

## 2020-02-27 MED ORDER — ALUM & MAG HYDROXIDE-SIMETH 200-200-20 MG/5ML PO SUSP: 30.0000 mL | Freq: Once | ORAL | Status: DC

## 2020-02-27 NOTE — ED Provider Notes (Signed)
[facility] Emergency Department Provider Note    First MD Initiated Contact with Patient 02/27/20 1621     (approximate)  I have reviewed the triage vital signs and the nursing notes.   HISTORY  Chief Complaint Hypertension    HPI Sherry Watkins is a 39 y.o. female   [place] past medical history presents to the ER for 1 week of chest pressure and concern over elevated blood pressure.  Was seen at outpatient clinic today for ER follow-up and directed back to the ER because she has been having persistent chest discomfort and high blood pressure.  Is not any birth control.  Denies any diaphoresis.  No pain radiating through to her back shoulder or jaw.  No nausea.   Past Medical History:  Diagnosis Date  . Complication of anesthesia   . Depression   . Endometriosis   . GERD (gastroesophageal reflux disease)    NO MEDS  . Headache    FREQUENTLY  . Ovarian cyst    Family History  Problem Relation Age of Onset  . Diabetes Mother   . Hypertension Mother   . Heart disease Father   . Cancer Father   . Heart disease Brother    Past Surgical History:  Procedure Laterality Date  . ABLATION    . CESAREAN SECTION    . CYSTOSCOPY N/A 08/24/2018   Procedure: CYSTOSCOPY;  Surgeon: Will Bonnet, MD;  Location: [facility] ORS;  Service: Gynecology;  Laterality: N/A;  . HYSTEROSCOPY WITH NOVASURE N/A 05/27/2015   Procedure: HYSTEROSCOPY WITH NOVASURE;  Surgeon: Aletha Halim, MD;  Location: [facility] ORS;  Service: Gynecology;  Laterality: N/A;  . LAPAROSCOPY N/A 07/07/2017   Procedure: LAPAROSCOPY DIAGNOSTIC;  Surgeon: Malachy Mood, MD;  Location: [facility] ORS;  Service: Gynecology;  Laterality: N/A;  . ROBOTIC ASSISTED TOTAL HYSTERECTOMY Bilateral 08/24/2018   Procedure: ROBOTIC ASSISTED TOTAL HYSTERECTOMY BILATERAL SALPINGECTOMY;  Surgeon: Will Bonnet, MD;  Location: [facility] ORS;  Service: Gynecology;  Laterality: Bilateral;  . TUBAL LIGATION     Patient  Active Problem List   Diagnosis Date Noted  . Endometriosis 10/17/2018  . Adenomyosis 08/17/2018  . Chronic low back pain (Right) without sciatica 02/09/2018  . Elevated sed rate 02/01/2018  . Vitamin D deficiency 02/01/2018  . Chronic sacroiliac joint pain (Right) 02/01/2018  . Other specified dorsopathies, sacral and sacrococcygeal region 02/01/2018  . Chronic abdominal pain (RLQ) (Primary Area of Pain) (Right) 12/26/2017  . Chronic lower extremity pain (Secondary Area of Pain) (Right) 12/26/2017  . Chronic low back pain with right-sided sciatica [facility] Area of Pain) (Bilateral) (R>L) 12/26/2017  . Chronic pain syndrome 12/26/2017  . Opiate use 12/26/2017  . Problems influencing health status 12/26/2017  . Pharmacologic therapy 12/26/2017  . Disorder of skeletal system 12/26/2017      Prior to Admission medications   Medication Sig Start Date End Date Taking? Authorizing Provider  benzonatate (TESSALON PERLES) 100 MG capsule Take 1-2 tabs TID prn cough 01/20/20   Menshew, Dannielle Karvonen, PA-C  famotidine (PEPCID) 20 MG tablet Take 1 tablet (20 mg total) by mouth 2 (two) times daily. 02/27/20 02/26/21  Merlyn Lot, MD  hydrochlorothiazide (HYDRODIURIL) 25 MG tablet Take 1 tablet (25 mg total) by mouth daily. 02/25/20   Earleen Newport, MD  Multiple Vitamins-Minerals (WOMENS MULTIVITAMIN PO) Take 1 tablet by mouth at bedtime.    [provider]  ondansetron (ZOFRAN ODT) 4 MG disintegrating tablet Take 1 tablet (4 mg total) by  mouth every 8 (eight) hours as needed. 01/20/20   Menshew, Dannielle Karvonen, PA-C  potassium chloride (KLOR-CON M10) 10 MEQ tablet Take 1 tablet (10 mEq total) by mouth daily. 02/27/20   Merlyn Lot, MD  traMADol (ULTRAM) 50 MG tablet Take 1 tablet (50 mg total) by mouth every 6 (six) hours as needed for severe pain. 02/01/18 08/21/18  Milinda Pointer, MD    Allergies Patient has no known allergies.    Social History Social History    Tobacco Use  . Smoking status: Former Smoker    Years: 15.00    Types: Cigarettes    Quit date: 06/30/2004    Years since quitting: 15.6  . Smokeless tobacco: Never Used  Substance Use Topics  . Alcohol use: Yes    Comment: OCC  . Drug use: No    Review of Systems Patient denies headaches, rhinorrhea, blurry vision, numbness, shortness of breath, chest pain, edema, cough, abdominal pain, nausea, vomiting, diarrhea, dysuria, fevers, rashes or hallucinations unless otherwise stated above in HPI. ____________________________________________   PHYSICAL EXAM:  VITAL SIGNS: Vitals:   02/27/20 1840 02/27/20 1920  BP:  110/84  Pulse: 81 71  Resp: 14 17  Temp:    SpO2: 98% 98%    Constitutional: Alert and oriented.  Eyes: Conjunctivae are normal.  Head: Atraumatic. Nose: No congestion/rhinnorhea. Mouth/Throat: Mucous membranes are moist.   Neck: No stridor. Painless ROM.  Cardiovascular: Normal rate, regular rhythm. Grossly normal heart sounds.  Good peripheral circulation. Respiratory: Normal respiratory effort.  No retractions. Lungs CTAB. Gastrointestinal: Soft and nontender. No distention. No abdominal bruits. No CVA tenderness. Genitourinary:  Musculoskeletal: No lower extremity tenderness nor edema.  No joint effusions. Neurologic:  Normal speech and language. No gross focal neurologic deficits are appreciated. No facial droop Skin:  Skin is warm, dry and intact. No rash noted. Psychiatric: Mood and affect are normal. Speech and behavior are normal.  ____________________________________________   LABS (all labs ordered are listed, but only abnormal results are displayed)  Results for orders placed or performed during the hospital encounter of 02/27/20 (from the past 24 hour(s))  Basic metabolic panel     Status: Abnormal   Collection Time: 02/27/20  1:16 PM  Result Value Ref Range   Sodium 134 (L) 135 - 145 mmol/L   Potassium 2.7 (LL) 3.5 - 5.1 mmol/L   Chloride  94 (L) 98 - 111 mmol/L   CO2 26 22 - 32 mmol/L   Glucose, Bld 124 (H) 70 - 99 mg/dL   BUN 9 6 - 20 mg/dL   Creatinine, Ser 0.80 0.44 - 1.00 mg/dL   Calcium 9.5 8.9 - 10.3 mg/dL   GFR calc non Af Amer >60 >60 mL/min   GFR calc Af Amer >60 >60 mL/min   Anion gap 14 5 - 15  CBC     Status: Abnormal   Collection Time: 02/27/20  1:16 PM  Result Value Ref Range   WBC 11.7 (H) 4.0 - 10.5 K/uL   RBC 5.26 (H) 3.87 - 5.11 MIL/uL   Hemoglobin 15.6 (H) 12.0 - 15.0 g/dL   HCT 46.0 36.0 - 46.0 %   MCV 87.5 80.0 - 100.0 fL   MCH 29.7 26.0 - 34.0 pg   MCHC 33.9 30.0 - 36.0 g/dL   RDW 12.9 11.5 - 15.5 %   Platelets 424 (H) 150 - 400 K/uL   nRBC 0.0 0.0 - 0.2 %  Troponin I (High Sensitivity)     Status: None  Collection Time: 02/27/20  1:16 PM  Result Value Ref Range   Troponin I (High Sensitivity) 5 <18 ng/L  Troponin I (High Sensitivity)     Status: None   Collection Time: 02/27/20  5:47 PM  Result Value Ref Range   Troponin I (High Sensitivity) 5 <18 ng/L  Fibrin derivatives D-Dimer ([facility] only)     Status: Abnormal   Collection Time: 02/27/20  5:47 PM  Result Value Ref Range   Fibrin derivatives D-dimer ([facility]) 512.81 (H) 0.00 - 499.00 ng/mL (FEU)   ____________________________________________  EKG My review and personal interpretation at Time: 13:23   Indication: htn  Rate: 95  Rhythm: sinus Axis: normal Other: normal intervals, no stemi ____________________________________________  RADIOLOGY  I personally reviewed all radiographic images ordered to evaluate for the above acute complaints and reviewed radiology reports and findings.  These findings were personally discussed with the patient.  Please see medical record for radiology report.  ____________________________________________   PROCEDURES  Procedure(s) performed:  Procedures    Critical Care performed: no ____________________________________________   INITIAL IMPRESSION / ASSESSMENT AND PLAN / ED  COURSE  Pertinent labs & imaging results that were available during my care of the patient were reviewed by me and considered in my medical decision making (see chart for details).   DDX: Hypertension, CHF, PE, ACS, electrolyte abnormality, reflux, hiatal hernia  Sherry Watkins is a 39 y.o. who presents to the ED with symptoms as described above.  Patient's symptoms have been ongoing for several days now.  She is hypertensive mildly tachycardic.  EKG is nonischemic.  She is low risk by heart score.  Serial enzymes are negative.  She is with a sinus rhythm on telemetry.  D-dimer was mildly elevated CT angiogram ordered to exclude PE shows none.  Does have noted hiatal hernia.  Patient reassessed she is pain-free.  I do suspect some component of reflux.  No signs of congestive heart failure.  She stable and appropriate for outpatient follow-up.     The patient was evaluated in Emergency Department today for the symptoms described in the history of present illness. He/she was evaluated in the context of the global COVID-19 pandemic, which necessitated consideration that the patient might be at risk for infection with the SARS-CoV-2 virus that causes COVID-19. Institutional protocols and algorithms that pertain to the evaluation of patients at risk for COVID-19 are in a state of rapid change based on information released by regulatory bodies including the CDC and federal and state organizations. These policies and algorithms were followed during the patient's care in the ED.  As part of my medical decision making, I reviewed the following data within the [place] notes reviewed and incorporated, Labs reviewed, notes from prior ED visits and [place] Controlled Substance Database   ____________________________________________   FINAL CLINICAL IMPRESSION(S) / ED DIAGNOSES  Final diagnoses:  Hypertension, unspecified type  Chest pain, unspecified type  Hiatal hernia       NEW MEDICATIONS STARTED DURING THIS VISIT:  Discharge Medication List as of 02/27/2020  7:14 PM    START taking these medications   Details  famotidine (PEPCID) 20 MG tablet Take 1 tablet (20 mg total) by mouth 2 (two) times daily., Starting Wed 02/27/2020, Until Thu 02/26/2021, Normal    potassium chloride (KLOR-CON M10) 10 MEQ tablet Take 1 tablet (10 mEq total) by mouth daily., Starting Wed 02/27/2020, Normal         Note:  This document was prepared  using Systems analyst and may include unintentional dictation errors.    Merlyn Lot, MD 02/27/20 [phone]

## 2020-02-27 NOTE — ED Triage Notes (Signed)
Arrives from [facility] for ED evaluation. Patient seen on 4/5 for hypertensive crisis.  Followed up today at [facility] and BP remains elevated.

## 2020-03-05 ENCOUNTER — Emergency Department: Payer: BC Managed Care – PPO

## 2020-03-05 ENCOUNTER — Encounter: Payer: Self-pay | Admitting: Emergency Medicine

## 2020-03-05 ENCOUNTER — Other Ambulatory Visit: Payer: Self-pay

## 2020-03-05 DIAGNOSIS — I10 Essential (primary) hypertension: Secondary | ICD-10-CM | POA: Diagnosis not present

## 2020-03-05 DIAGNOSIS — Z79899 Other long term (current) drug therapy: Secondary | ICD-10-CM | POA: Insufficient documentation

## 2020-03-05 DIAGNOSIS — Z87891 Personal history of nicotine dependence: Secondary | ICD-10-CM | POA: Insufficient documentation

## 2020-03-05 DIAGNOSIS — R0789 Other chest pain: Secondary | ICD-10-CM | POA: Insufficient documentation

## 2020-03-05 LAB — CBC WITH DIFFERENTIAL/PLATELET
Abs Immature Granulocytes: 0.04 10*3/uL (ref 0.00–0.07)
Basophils Absolute: 0 10*3/uL (ref 0.0–0.1)
Basophils Relative: 0 %
Eosinophils Absolute: 0.2 10*3/uL (ref 0.0–0.5)
Eosinophils Relative: 2 %
HCT: 42 % (ref 36.0–46.0)
Hemoglobin: 14.5 g/dL (ref 12.0–15.0)
Immature Granulocytes: 0 %
Lymphocytes Relative: 28 %
Lymphs Abs: 2.8 10*3/uL (ref 0.7–4.0)
MCH: 30.1 pg (ref 26.0–34.0)
MCHC: 34.5 g/dL (ref 30.0–36.0)
MCV: 87.3 fL (ref 80.0–100.0)
Monocytes Absolute: 0.6 10*3/uL (ref 0.1–1.0)
Monocytes Relative: 6 %
Neutro Abs: 6.3 10*3/uL (ref 1.7–7.7)
Neutrophils Relative %: 64 %
Platelets: 366 10*3/uL (ref 150–400)
RBC: 4.81 MIL/uL (ref 3.87–5.11)
RDW: 13 % (ref 11.5–15.5)
WBC: 10.1 10*3/uL (ref 4.0–10.5)
nRBC: 0 % (ref 0.0–0.2)

## 2020-03-05 NOTE — ED Triage Notes (Signed)
Pt to triage via w/c with no distress noted, mask in place; pt reports began BP med on Tuesday and cont to have left sided CP with HTN

## 2020-03-06 ENCOUNTER — Encounter: Payer: Self-pay | Admitting: Emergency Medicine

## 2020-03-06 ENCOUNTER — Emergency Department
Admission: EM | Admit: 2020-03-06 | Discharge: 2020-03-06 | Disposition: A | Discharge status: Home / Self Care | Payer: BC Managed Care – PPO | Attending: Emergency Medicine | Admitting: Emergency Medicine

## 2020-03-06 ENCOUNTER — Other Ambulatory Visit: Payer: Self-pay | Admitting: Family Medicine

## 2020-03-06 DIAGNOSIS — R079 Chest pain, unspecified: Secondary | ICD-10-CM

## 2020-03-06 DIAGNOSIS — Z1231 Encounter for screening mammogram for malignant neoplasm of breast: Secondary | ICD-10-CM

## 2020-03-06 DIAGNOSIS — I10 Essential (primary) hypertension: Secondary | ICD-10-CM

## 2020-03-06 HISTORY — DX: Essential (primary) hypertension: I10

## 2020-03-06 HISTORY — DX: Obesity, unspecified: E66.9

## 2020-03-06 LAB — COMPREHENSIVE METABOLIC PANEL
ALT: 39 U/L (ref 0–44)
AST: 37 U/L (ref 15–41)
Albumin: 4 g/dL (ref 3.5–5.0)
Alkaline Phosphatase: 51 U/L (ref 38–126)
Anion gap: 9 (ref 5–15)
BUN: 6 mg/dL (ref 6–20)
CO2: 25 mmol/L (ref 22–32)
Calcium: 9.1 mg/dL (ref 8.9–10.3)
Chloride: 104 mmol/L (ref 98–111)
Creatinine, Ser: 0.67 mg/dL (ref 0.44–1.00)
GFR calc Af Amer: >60 mL/min (ref >60)
GFR calc non Af Amer: >60 mL/min (ref >60)
Glucose, Bld: 99 mg/dL (ref 70–99)
Potassium: 3.6 mmol/L (ref 3.5–5.1)
Sodium: 138 mmol/L (ref 135–145)
Total Bilirubin: 0.9 mg/dL (ref 0.3–1.2)
Total Protein: 8.2 g/dL — ABNORMAL HIGH (ref 6.5–8.1)

## 2020-03-06 LAB — TROPONIN I (HIGH SENSITIVITY)
Troponin I (High Sensitivity): 4 ng/L (ref <18)
Troponin I (High Sensitivity): 4 ng/L (ref <18)

## 2020-03-06 NOTE — ED Provider Notes (Signed)
[facility] Emergency Department Provider Note  ____________________________________________   First MD Initiated Contact with Patient 03/06/20 0530     (approximate)  I have reviewed the triage vital signs and the nursing notes.   HISTORY  Chief Complaint Chest Pain    HPI Sherry Watkins is a 39 y.o. female with medical history as listed below who has been seen multiple times in the emergency department  over the last 6 months for accommodation of chest pain and high blood pressure with multiple visits prior to that as well.  She presents tonight for evaluation of persistent chest pain.  Initially she said that she was having sharp pain in the left side of her chest that is radiating to the left arm and that has been present for about 3 days.  She then clarified that she has been actually having pain for at least 2 weeks, although the medical record indicates that she was having chest pain back in August of last year which was approximately 8 months ago.  She reports that after being to this emergency department about a week ago she followed up with her primary care doctor and was started on amlodipine but she had difficulty getting it from the pharmacy so she has only had the medication for about 2 days.  She came in tonight because of the persistent severe sharp left-sided chest pain and her high blood pressure.  She denies nausea, vomiting, and diarrhea.  Nothing particular makes the symptoms better or worse.  She said that it makes it hard for her to work.        Past Medical History:  Diagnosis Date  . Complication of anesthesia   . Depression   . Endometriosis   . GERD (gastroesophageal reflux disease)    NO MEDS  . Headache    FREQUENTLY  . Hypertension   . Obesity   . Ovarian cyst     Patient Active Problem List   Diagnosis Date Noted  . Endometriosis 10/17/2018  . Adenomyosis 08/17/2018  . Chronic low back pain (Right) without sciatica  02/09/2018  . Elevated sed rate 02/01/2018  . Vitamin D deficiency 02/01/2018  . Chronic sacroiliac joint pain (Right) 02/01/2018  . Other specified dorsopathies, sacral and sacrococcygeal region 02/01/2018  . Chronic abdominal pain (RLQ) (Primary Area of Pain) (Right) 12/26/2017  . Chronic lower extremity pain (Secondary Area of Pain) (Right) 12/26/2017  . Chronic low back pain with right-sided sciatica [facility] Area of Pain) (Bilateral) (R>L) 12/26/2017  . Chronic pain syndrome 12/26/2017  . Opiate use 12/26/2017  . Problems influencing health status 12/26/2017  . Pharmacologic therapy 12/26/2017  . Disorder of skeletal system 12/26/2017    Past Surgical History:  Procedure Laterality Date  . ABLATION    . CESAREAN SECTION    . CYSTOSCOPY N/A 08/24/2018   Procedure: CYSTOSCOPY;  Surgeon: Will Bonnet, MD;  Location: [facility] ORS;  Service: Gynecology;  Laterality: N/A;  . HYSTEROSCOPY WITH NOVASURE N/A 05/27/2015   Procedure: HYSTEROSCOPY WITH NOVASURE;  Surgeon: Aletha Halim, MD;  Location: [facility] ORS;  Service: Gynecology;  Laterality: N/A;  . LAPAROSCOPY N/A 07/07/2017   Procedure: LAPAROSCOPY DIAGNOSTIC;  Surgeon: Malachy Mood, MD;  Location: [facility] ORS;  Service: Gynecology;  Laterality: N/A;  . ROBOTIC ASSISTED TOTAL HYSTERECTOMY Bilateral 08/24/2018   Procedure: ROBOTIC ASSISTED TOTAL HYSTERECTOMY BILATERAL SALPINGECTOMY;  Surgeon: Will Bonnet, MD;  Location: [facility] ORS;  Service: Gynecology;  Laterality: Bilateral;  . TUBAL LIGATION  Prior to Admission medications   Medication Sig Start Date End Date Taking? Authorizing Provider  benzonatate (TESSALON PERLES) 100 MG capsule Take 1-2 tabs TID prn cough 01/20/20   Menshew, Dannielle Karvonen, PA-C  famotidine (PEPCID) 20 MG tablet Take 1 tablet (20 mg total) by mouth 2 (two) times daily. 02/27/20 02/26/21  Merlyn Lot, MD  hydrochlorothiazide (HYDRODIURIL) 25 MG tablet Take 1 tablet (25 mg total) by mouth daily.  02/25/20   Earleen Newport, MD  Multiple Vitamins-Minerals (WOMENS MULTIVITAMIN PO) Take 1 tablet by mouth at bedtime.    [provider]  ondansetron (ZOFRAN ODT) 4 MG disintegrating tablet Take 1 tablet (4 mg total) by mouth every 8 (eight) hours as needed. 01/20/20   Menshew, Dannielle Karvonen, PA-C  potassium chloride (KLOR-CON M10) 10 MEQ tablet Take 1 tablet (10 mEq total) by mouth daily. 02/27/20   Merlyn Lot, MD  traMADol (ULTRAM) 50 MG tablet Take 1 tablet (50 mg total) by mouth every 6 (six) hours as needed for severe pain. 02/01/18 08/21/18  Milinda Pointer, MD    Allergies Patient has no known allergies.  Family History  Problem Relation Age of Onset  . Diabetes Mother   . Hypertension Mother   . Heart disease Father   . Cancer Father   . Heart disease Brother     Social History Social History   Tobacco Use  . Smoking status: Former Smoker    Years: 15.00    Types: Cigarettes    Quit date: 06/30/2004    Years since quitting: 15.6  . Smokeless tobacco: Never Used  Substance Use Topics  . Alcohol use: Yes    Comment: OCC  . Drug use: No    Review of Systems Constitutional: No fever/chills Eyes: No visual changes. ENT: No sore throat. Cardiovascular: +chest pain. Respiratory: Denies shortness of breath. Gastrointestinal: No abdominal pain.  No nausea, no vomiting.  No diarrhea.  No constipation. Genitourinary: Negative for dysuria. Musculoskeletal: Negative for neck pain.  Negative for back pain. Integumentary: Negative for rash. Neurological: Negative for headaches, focal weakness or numbness.   ____________________________________________   PHYSICAL EXAM:  VITAL SIGNS: ED Triage Vitals  Enc Vitals Group     BP 03/05/20 2319 (!) 171/98     Pulse Rate 03/05/20 2319 77     Resp 03/05/20 2319 18     Temp 03/05/20 2319 98.3 F (36.8 C)     Temp Source 03/05/20 2319 Oral     SpO2 03/05/20 2319 100 %     Weight 03/05/20 2319 106.1 kg (234  lb)     Height 03/05/20 2319 1.575 m (5\' 2" )     Head Circumference --      Peak Flow --      Pain Score 03/05/20 2318 8     Pain Loc --      Pain Edu? --      Excl. in [place]? --     Constitutional: Alert and oriented.  Eyes: Conjunctivae are normal.  Head: Atraumatic. Nose: No congestion/rhinnorhea. Mouth/Throat: Patient is wearing a mask. Neck: No stridor.  No meningeal signs.   Cardiovascular: Normal rate, regular rhythm. Good peripheral circulation. Grossly normal heart sounds. Respiratory: Normal respiratory effort.  No retractions. Gastrointestinal: Obese.  Soft and non-distended. Musculoskeletal: No lower extremity tenderness nor edema. No gross deformities of extremities. Neurologic:  Normal speech and language. No gross focal neurologic deficits are appreciated.  Skin:  Skin is warm, dry and intact. Psychiatric: Mood and  affect are normal. Speech and behavior are normal.  ____________________________________________   LABS (all labs ordered are listed, but only abnormal results are displayed)  Labs Reviewed  COMPREHENSIVE METABOLIC PANEL - Abnormal; Notable for the following components:      Result Value   Total Protein 8.2 (*)    All other components within normal limits  CBC WITH DIFFERENTIAL/PLATELET  TROPONIN I (HIGH SENSITIVITY)  TROPONIN I (HIGH SENSITIVITY)   ____________________________________________  EKG  ED ECG REPORT I, Hinda Kehr, the attending physician, personally viewed and interpreted this ECG.  Date: 03/05/2020 EKG Time: 23: 22 Rate: 81 Rhythm: normal sinus rhythm QRS Axis: normal Intervals: normal ST/T Wave abnormalities: normal Narrative Interpretation: no evidence of acute ischemia  ____________________________________________  RADIOLOGY I, Hinda Kehr, personally viewed and evaluated these images (plain radiographs) as part of my medical decision making, as well as reviewing the written report by the radiologist.  ED MD  interpretation: No acute abnormalities identified on chest x-ray  Official radiology report(s): DG Chest 2 View  Result Date: 03/05/2020 CLINICAL DATA:  Chest pain for several days EXAM: CHEST - 2 VIEW COMPARISON:  02/27/2020 FINDINGS: Cardiac shadow is within normal limits. The lungs are well aerated bilaterally. No focal infiltrate or sizable effusion is noted. Known hiatal hernia is not well appreciated on today's exam. No bony abnormality is seen. IMPRESSION: No active cardiopulmonary disease. Electronically Signed   By: Inez Catalina M.D.   On: 03/05/2020 23:43    ____________________________________________   PROCEDURES   Procedure(s) performed (including Critical Care):  .1-3 Lead EKG Interpretation Performed by: Hinda Kehr, MD Authorized by: Hinda Kehr, MD     Interpretation: normal     ECG rate:  73   ECG rate assessment: normal     Rhythm: sinus rhythm     Ectopy: none     Conduction: normal       ____________________________________________   INITIAL IMPRESSION / MDM / ASSESSMENT AND PLAN / ED COURSE  As part of my medical decision making, I reviewed the following data within the [place] notes reviewed and incorporated, Labs reviewed , EKG interpreted , Old EKG reviewed, Old chart reviewed, Radiograph reviewed , Notes from prior ED visits and [place] Controlled Substance Database   Differential diagnosis includes, but is not limited to, musculoskeletal pain, stable angina, ACS, PE, pneumonia.  The patient has no acute abnormalities on chest x-ray which I personally reviewed, no changes or evidence of ischemia on EKG, stable vital signs other than her persistent hypertension, a high-sensitivity troponin of 4, normal CBC, and normal comprehensive metabolic panel.  She has no evidence of an emergent medical condition, she is PERC negative and in fact was evaluated with a CTA chest within the last year to rule out pulmonary embolism when she  was also having chest pain.  She is afebrile with no infectious signs or symptoms.  The patient was on the cardiac monitor to evaluate for evidence of arrhythmia and/or significant heart rate changes.  I explained to the patient that there is no evidence that she has an emergent medical condition and that she needs to give herself some more time on her blood pressure medicine and see substantial changes and she needs to work with her outpatient doctor on these other issues but that the emergency department does not have anything else to offer her at this time other than to rule out emergent medical condition.  She said that she needs somebody to figure this  out and that she may go to [facility].  I agreed with her that this certainly is a possibility but I also provided the name and contact information for a local cardiologist, Dr. Ubaldo Glassing, who is currently on-call, in case she wants to see a specialist locally.  She says she will also go back to her regular doctor, Dr. Rebeca Alert.  I gave my usual and customary return precautions.      ____________________________________________  FINAL CLINICAL IMPRESSION(S) / ED DIAGNOSES  Final diagnoses:  Chest pain, unspecified type  Essential hypertension     MEDICATIONS GIVEN DURING THIS VISIT:  Medications - No data to display   ED Discharge Orders    None      *Please note:  Sherry Watkins was evaluated in Emergency Department on 03/06/2020 for the symptoms described in the history of present illness. She was evaluated in the context of the global COVID-19 pandemic, which necessitated consideration that the patient might be at risk for infection with the SARS-CoV-2 virus that causes COVID-19. Institutional protocols and algorithms that pertain to the evaluation of patients at risk for COVID-19 are in a state of rapid change based on information released by regulatory bodies including the CDC and federal and state organizations. These policies and  algorithms were followed during the patient's care in the ED.  Some ED evaluations and interventions may be delayed as a result of limited staffing during the pandemic.*  Note:  This document was prepared using Dragon voice recognition software and may include unintentional dictation errors.   Hinda Kehr, MD 03/06/20 [phone]

## 2020-03-06 NOTE — Discharge Instructions (Signed)
You have been seen in the Emergency Department (ED) today for chest pain.  As we have discussed today's test results are normal.  In the recent past you have had multiple sets of normal lab work, normal chest x-rays, and a CT scan of your chest that was also normal.  Even though you are having persistent pain for an extended period of time, there is no evidence of an emergent medical condition at this time.  Please follow up with the recommended doctor as instructed above in these documents regarding today's emergent visit and your recent symptoms to discuss further management.  Continue to take your regular medications. If you are not doing so already, please also take a daily baby aspirin (81 mg), at least until you follow up with your doctor.    Return to the emergency department if you develop new or worsening symptoms that concern you.

## 2020-03-29 ENCOUNTER — Encounter: Payer: Self-pay | Admitting: Emergency Medicine

## 2020-03-29 ENCOUNTER — Other Ambulatory Visit: Payer: Self-pay

## 2020-03-29 ENCOUNTER — Emergency Department: Payer: Medicaid Other

## 2020-03-29 DIAGNOSIS — R0789 Other chest pain: Secondary | ICD-10-CM | POA: Insufficient documentation

## 2020-03-29 DIAGNOSIS — Z5321 Procedure and treatment not carried out due to patient leaving prior to being seen by health care provider: Secondary | ICD-10-CM | POA: Insufficient documentation

## 2020-03-29 LAB — BASIC METABOLIC PANEL
Anion gap: 13 (ref 5–15)
BUN: 8 mg/dL (ref 6–20)
CO2: 25 mmol/L (ref 22–32)
Calcium: 9.1 mg/dL (ref 8.9–10.3)
Chloride: 100 mmol/L (ref 98–111)
Creatinine, Ser: 0.58 mg/dL (ref 0.44–1.00)
GFR calc Af Amer: >60 mL/min (ref >60)
GFR calc non Af Amer: >60 mL/min (ref >60)
Glucose, Bld: 146 mg/dL — ABNORMAL HIGH (ref 70–99)
Potassium: 3.2 mmol/L — ABNORMAL LOW (ref 3.5–5.1)
Sodium: 138 mmol/L (ref 135–145)

## 2020-03-29 LAB — CBC
HCT: 39.8 % (ref 36.0–46.0)
Hemoglobin: 13.7 g/dL (ref 12.0–15.0)
MCH: 30 pg (ref 26.0–34.0)
MCHC: 34.4 g/dL (ref 30.0–36.0)
MCV: 87.3 fL (ref 80.0–100.0)
Platelets: 338 10*3/uL (ref 150–400)
RBC: 4.56 MIL/uL (ref 3.87–5.11)
RDW: 13.1 % (ref 11.5–15.5)
WBC: 9.9 10*3/uL (ref 4.0–10.5)
nRBC: 0 % (ref 0.0–0.2)

## 2020-03-29 LAB — TROPONIN I (HIGH SENSITIVITY): Troponin I (High Sensitivity): 3 ng/L (ref <18)

## 2020-03-29 MED ORDER — SODIUM CHLORIDE 0.9% FLUSH: 3.0000 mL | Freq: Once | INTRAVENOUS | Status: DC

## 2020-03-30 ENCOUNTER — Emergency Department
Admission: EM | Admit: 2020-03-30 | Discharge: 2020-03-30 | Disposition: A | Discharge status: Home / Self Care | Payer: Medicaid Other | Attending: Emergency Medicine | Admitting: Emergency Medicine

## 2020-03-31 ENCOUNTER — Telehealth: Payer: Self-pay | Admitting: Emergency Medicine

## 2020-03-31 NOTE — Telephone Encounter (Signed)
Called patient due to lwot to inquire about condition and follow up plans. Left message.   

## 2020-09-15 IMAGING — CR DG CHEST 2V
2 series · 2 of 2 positions shown · non-contrast
Comparison: March 05, 2020

CLINICAL DATA: Chest pain.

EXAM:
CHEST - 2 VIEW

[chest pa]
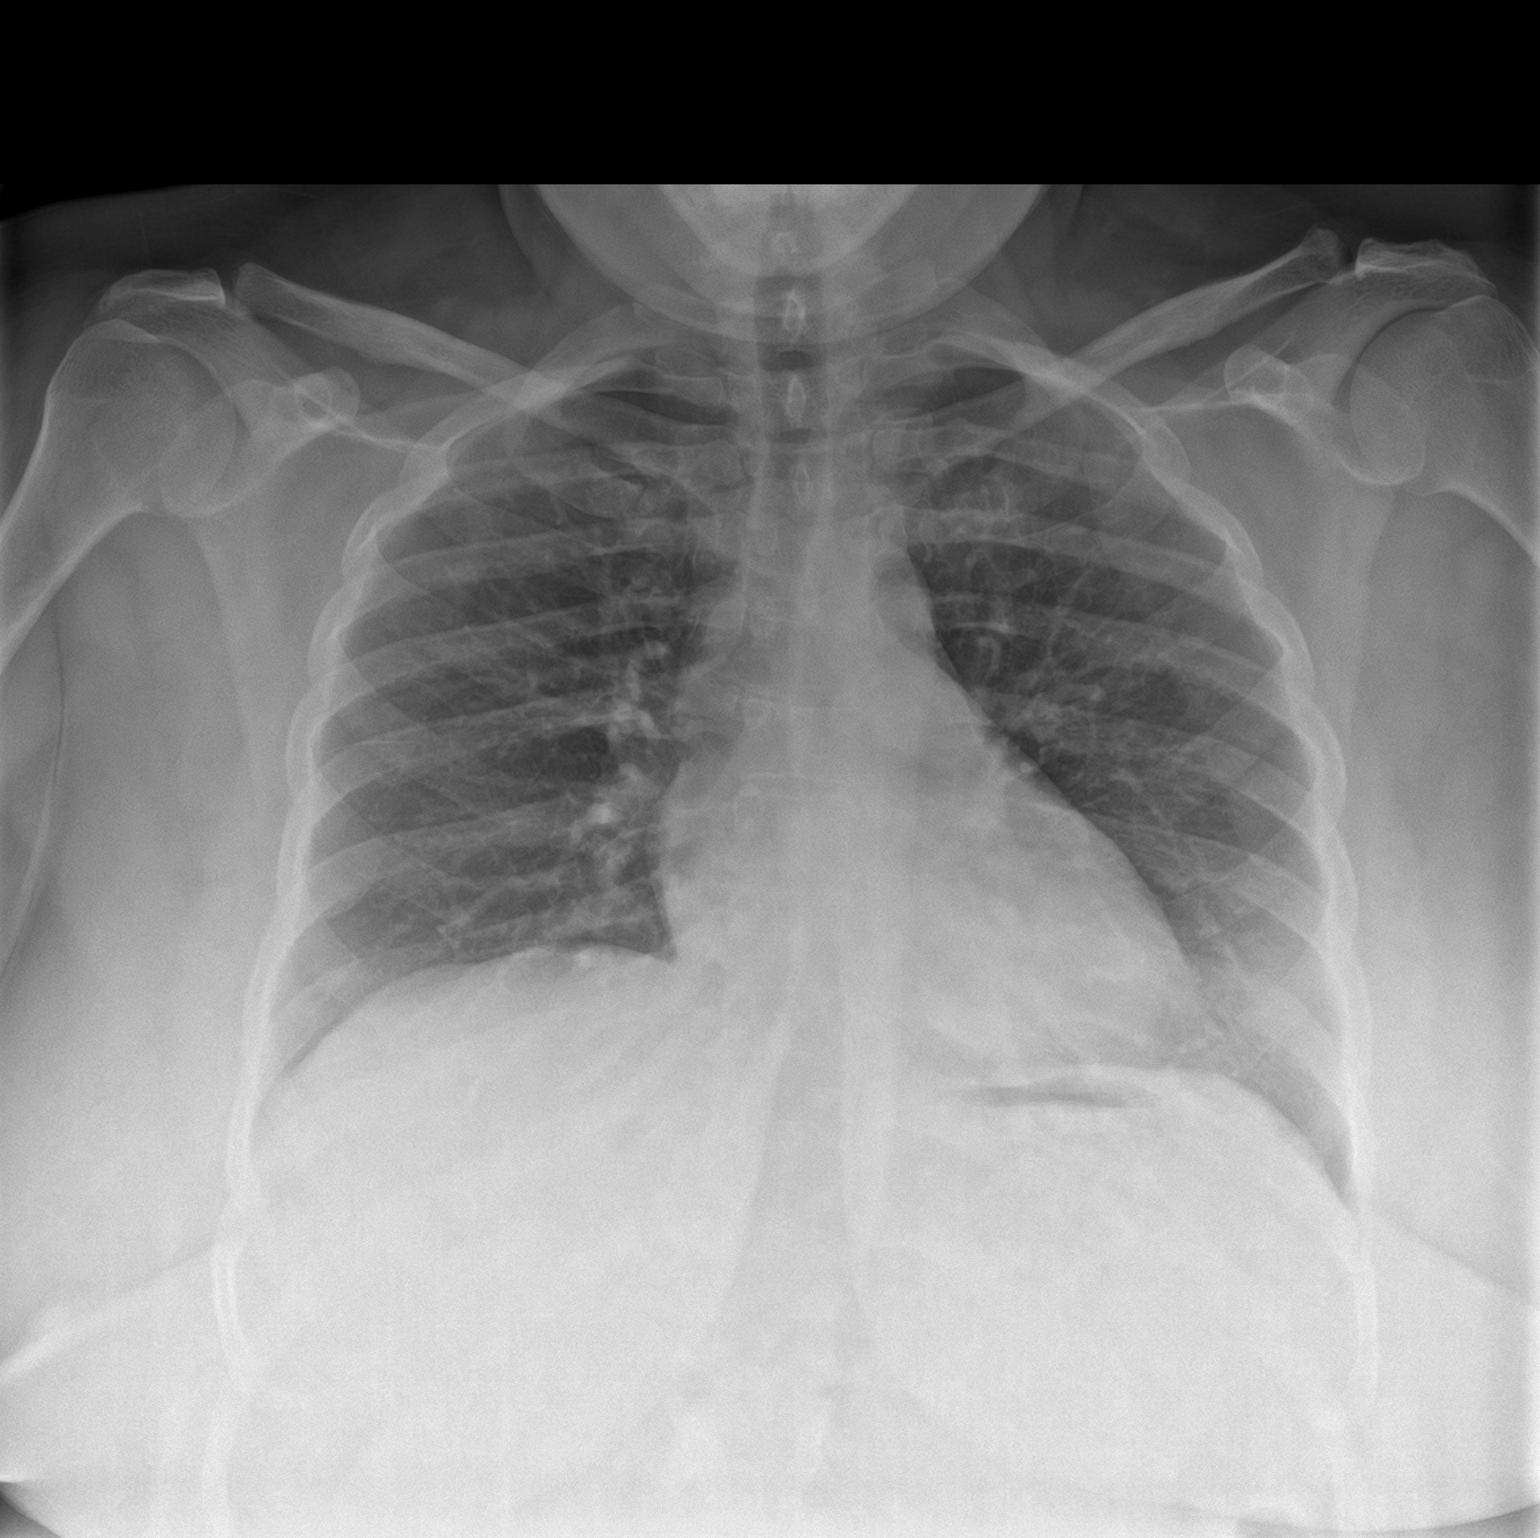

[chest lat]
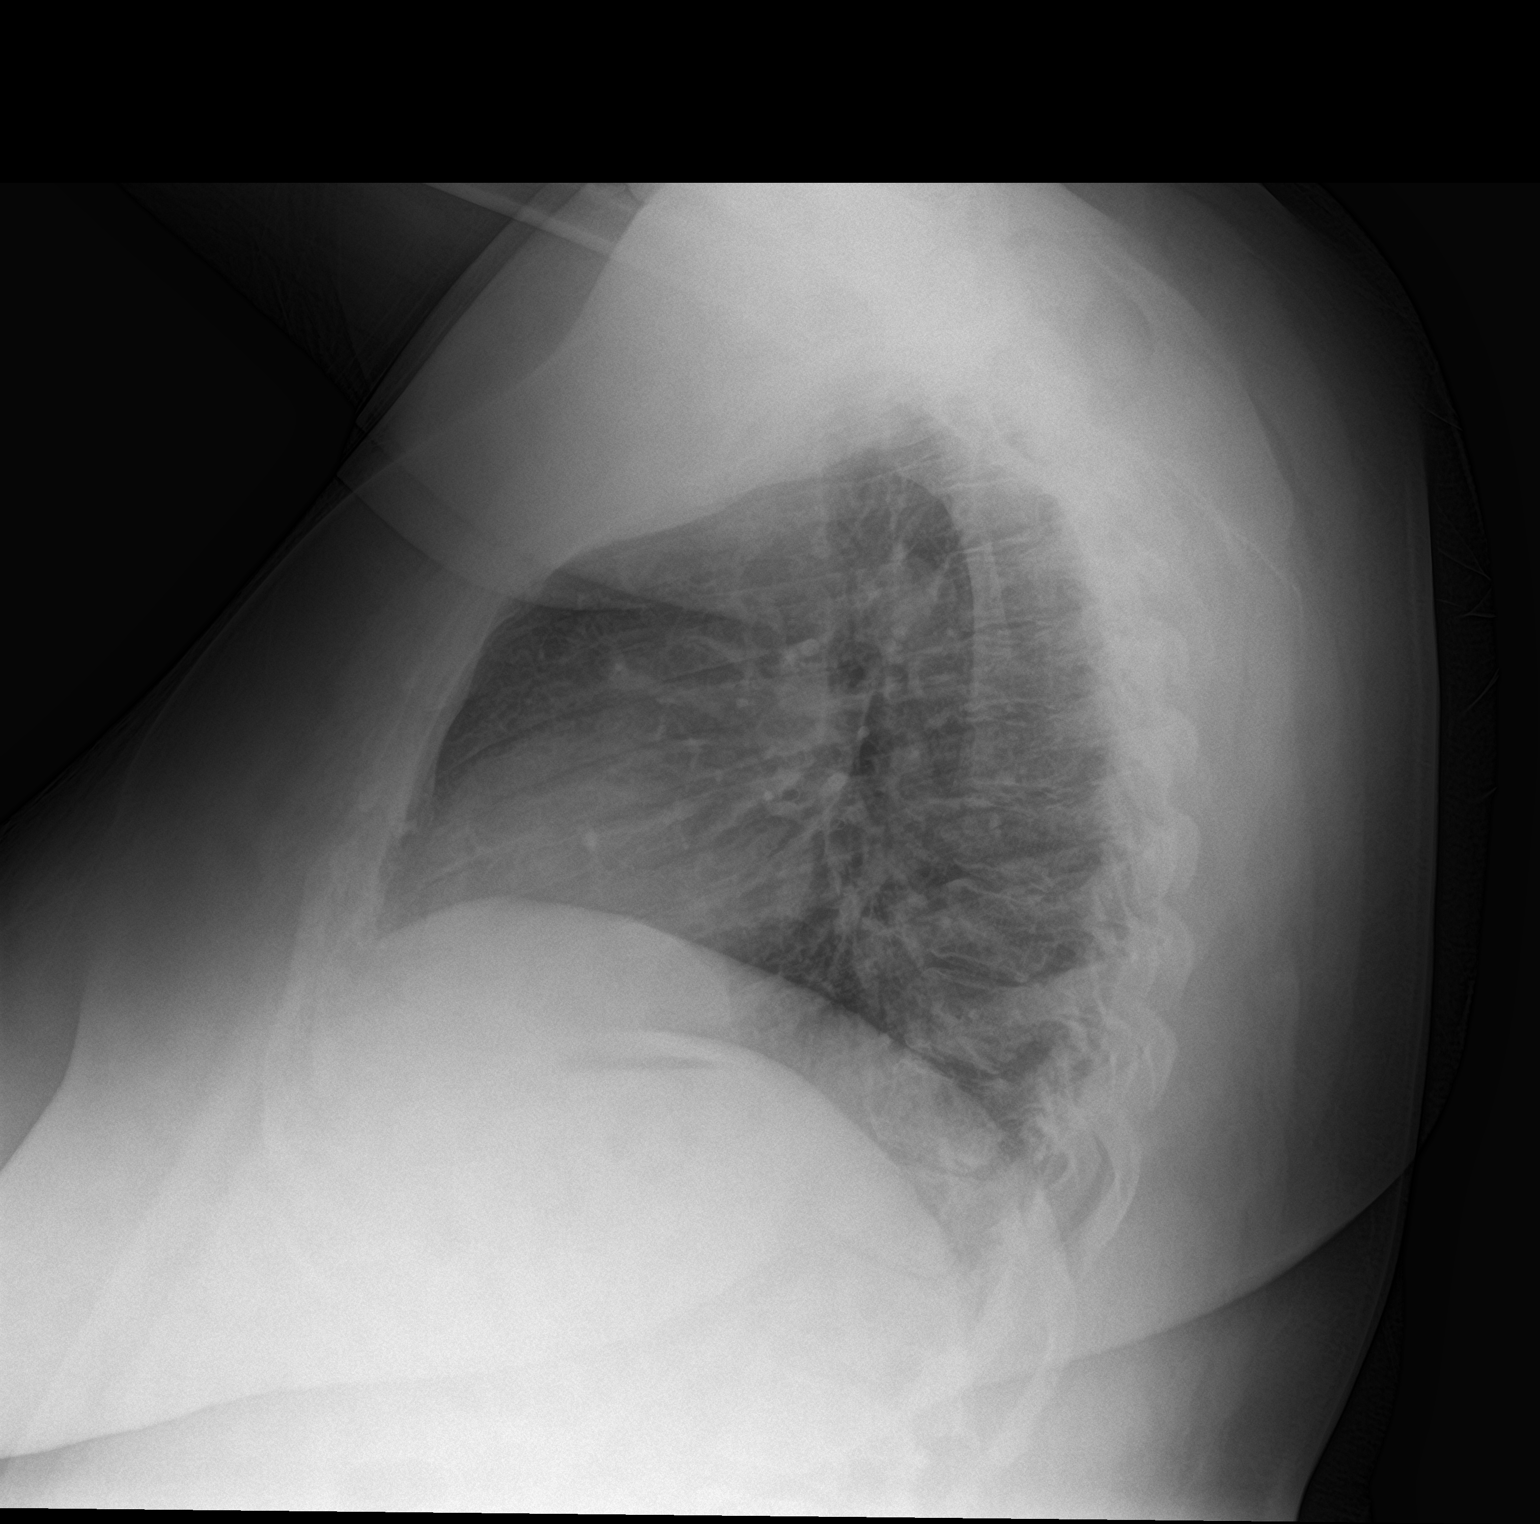

[2 of 2 positions shown; findings below may reference images not displayed]

FINDINGS: The heart size and mediastinal contours are within normal limits.
Both lungs are clear. The visualized skeletal structures are
unremarkable.
IMPRESSION: No active cardiopulmonary disease.

## 2020-10-30 ENCOUNTER — Emergency Department: Payer: Medicaid Other

## 2020-10-30 ENCOUNTER — Emergency Department
Admission: EM | Admit: 2020-10-30 | Discharge: 2020-10-30 | Disposition: A | Discharge status: Home / Self Care | Payer: Medicaid Other | Attending: Emergency Medicine | Admitting: Emergency Medicine

## 2020-10-30 ENCOUNTER — Other Ambulatory Visit: Payer: Self-pay

## 2020-10-30 DIAGNOSIS — R0789 Other chest pain: Secondary | ICD-10-CM | POA: Insufficient documentation

## 2020-10-30 DIAGNOSIS — I10 Essential (primary) hypertension: Secondary | ICD-10-CM | POA: Insufficient documentation

## 2020-10-30 DIAGNOSIS — Z79899 Other long term (current) drug therapy: Secondary | ICD-10-CM | POA: Insufficient documentation

## 2020-10-30 DIAGNOSIS — M79602 Pain in left arm: Secondary | ICD-10-CM | POA: Insufficient documentation

## 2020-10-30 DIAGNOSIS — M542 Cervicalgia: Secondary | ICD-10-CM | POA: Insufficient documentation

## 2020-10-30 DIAGNOSIS — K219 Gastro-esophageal reflux disease without esophagitis: Secondary | ICD-10-CM | POA: Insufficient documentation

## 2020-10-30 DIAGNOSIS — Z87891 Personal history of nicotine dependence: Secondary | ICD-10-CM | POA: Insufficient documentation

## 2020-10-30 LAB — CBC
HCT: 38.1 % (ref 36.0–46.0)
Hemoglobin: 11.9 g/dL — ABNORMAL LOW (ref 12.0–15.0)
MCH: 25.7 pg — ABNORMAL LOW (ref 26.0–34.0)
MCHC: 31.2 g/dL (ref 30.0–36.0)
MCV: 82.3 fL (ref 80.0–100.0)
Platelets: 401 10*3/uL — ABNORMAL HIGH (ref 150–400)
RBC: 4.63 MIL/uL (ref 3.87–5.11)
RDW: 15.3 % (ref 11.5–15.5)
WBC: 9.5 10*3/uL (ref 4.0–10.5)
nRBC: 0 % (ref 0.0–0.2)

## 2020-10-30 LAB — COMPREHENSIVE METABOLIC PANEL
ALT: 53 U/L — ABNORMAL HIGH (ref 0–44)
AST: 74 U/L — ABNORMAL HIGH (ref 15–41)
Albumin: 3.7 g/dL (ref 3.5–5.0)
Alkaline Phosphatase: 49 U/L (ref 38–126)
Anion gap: 9 (ref 5–15)
BUN: 6 mg/dL (ref 6–20)
CO2: 26 mmol/L (ref 22–32)
Calcium: 8.8 mg/dL — ABNORMAL LOW (ref 8.9–10.3)
Chloride: 104 mmol/L (ref 98–111)
Creatinine, Ser: 0.75 mg/dL (ref 0.44–1.00)
GFR, Estimated: >60 mL/min (ref >60)
Glucose, Bld: 116 mg/dL — ABNORMAL HIGH (ref 70–99)
Potassium: 3.3 mmol/L — ABNORMAL LOW (ref 3.5–5.1)
Sodium: 139 mmol/L (ref 135–145)
Total Bilirubin: 0.5 mg/dL (ref 0.3–1.2)
Total Protein: 8.2 g/dL — ABNORMAL HIGH (ref 6.5–8.1)

## 2020-10-30 LAB — TROPONIN I (HIGH SENSITIVITY)
Troponin I (High Sensitivity): 5 ng/L (ref <18)
Troponin I (High Sensitivity): 6 ng/L (ref <18)

## 2020-10-30 MED ORDER — CYCLOBENZAPRINE HCL 10 MG PO TABS: 10.0000 mg | ORAL_TABLET | Freq: Three times a day (TID) | ORAL | 0 refills | Status: AC | PRN

## 2020-10-30 MED ORDER — IBUPROFEN 600 MG PO TABS
600.0000 mg | ORAL_TABLET | Freq: Once | ORAL | Status: AC
  Administered 2020-10-30: 600 mg via ORAL
  Filled 2020-10-30: qty 1

## 2020-10-30 MED ORDER — CYCLOBENZAPRINE HCL 10 MG PO TABS
5.0000 mg | ORAL_TABLET | Freq: Once | ORAL | Status: AC
  Administered 2020-10-30: 5 mg via ORAL
  Filled 2020-10-30: qty 1

## 2020-10-30 NOTE — ED Provider Notes (Signed)
[facility] Emergency Department Provider Note   ____________________________________________   Event Date/Time   First MD Initiated Contact with Patient 10/30/20 1120     (approximate)  I have reviewed the triage vital signs and the nursing notes.   HISTORY  Chief Complaint Chest Pain    HPI Sherry Watkins is a 39 y.o. female with a stated past medical history of hypertension who presents for left neck/upper extremity/upper chest wall pain that began approximately 24 hours prior to arrival and has been stable since onset.  Patient does endorse occasional radiation from her neck to her chest.  Patient describes this pain as an aching 6/10 pain that has no exacerbating or relieving factors.         Past Medical History:  Diagnosis Date  . Complication of anesthesia   . Depression   . Endometriosis   . GERD (gastroesophageal reflux disease)    NO MEDS  . Headache    FREQUENTLY  . Hypertension   . Obesity   . Ovarian cyst     Patient Active Problem List   Diagnosis Date Noted  . Endometriosis 10/17/2018  . Adenomyosis 08/17/2018  . Chronic low back pain (Right) without sciatica 02/09/2018  . Elevated sed rate 02/01/2018  . Vitamin D deficiency 02/01/2018  . Chronic sacroiliac joint pain (Right) 02/01/2018  . Other specified dorsopathies, sacral and sacrococcygeal region 02/01/2018  . Chronic abdominal pain (RLQ) (Primary Area of Pain) (Right) 12/26/2017  . Chronic lower extremity pain (Secondary Area of Pain) (Right) 12/26/2017  . Chronic low back pain with right-sided sciatica [facility] Area of Pain) (Bilateral) (R>L) 12/26/2017  . Chronic pain syndrome 12/26/2017  . Opiate use 12/26/2017  . Problems influencing health status 12/26/2017  . Pharmacologic therapy 12/26/2017  . Disorder of skeletal system 12/26/2017    Past Surgical History:  Procedure Laterality Date  . ABLATION    . CESAREAN SECTION    . CYSTOSCOPY N/A 08/24/2018    Procedure: CYSTOSCOPY;  Surgeon: Will Bonnet, MD;  Location: [facility] ORS;  Service: Gynecology;  Laterality: N/A;  . HYSTEROSCOPY WITH NOVASURE N/A 05/27/2015   Procedure: HYSTEROSCOPY WITH NOVASURE;  Surgeon: Aletha Halim, MD;  Location: [facility] ORS;  Service: Gynecology;  Laterality: N/A;  . LAPAROSCOPY N/A 07/07/2017   Procedure: LAPAROSCOPY DIAGNOSTIC;  Surgeon: Malachy Mood, MD;  Location: [facility] ORS;  Service: Gynecology;  Laterality: N/A;  . ROBOTIC ASSISTED TOTAL HYSTERECTOMY Bilateral 08/24/2018   Procedure: ROBOTIC ASSISTED TOTAL HYSTERECTOMY BILATERAL SALPINGECTOMY;  Surgeon: Will Bonnet, MD;  Location: [facility] ORS;  Service: Gynecology;  Laterality: Bilateral;  . TUBAL LIGATION      Prior to Admission medications   Medication Sig Start Date End Date Taking? Authorizing Provider  benzonatate (TESSALON PERLES) 100 MG capsule Take 1-2 tabs TID prn cough 01/20/20   Menshew, Dannielle Karvonen, PA-C  cyclobenzaprine (FLEXERIL) 10 MG tablet Take 1 tablet (10 mg total) by mouth 3 (three) times daily as needed for up to 4 days for muscle spasms (left chest pain). 10/30/20 11/03/20  Naaman Plummer, MD  famotidine (PEPCID) 20 MG tablet Take 1 tablet (20 mg total) by mouth 2 (two) times daily. 02/27/20 02/26/21  Merlyn Lot, MD  hydrochlorothiazide (HYDRODIURIL) 25 MG tablet Take 1 tablet (25 mg total) by mouth daily. 02/25/20   Earleen Newport, MD  Multiple Vitamins-Minerals (WOMENS MULTIVITAMIN PO) Take 1 tablet by mouth at bedtime.    [provider]  ondansetron (ZOFRAN ODT) 4 MG disintegrating  tablet Take 1 tablet (4 mg total) by mouth every 8 (eight) hours as needed. 01/20/20   Menshew, Dannielle Karvonen, PA-C  potassium chloride (KLOR-CON M10) 10 MEQ tablet Take 1 tablet (10 mEq total) by mouth daily. 02/27/20   Merlyn Lot, MD  traMADol (ULTRAM) 50 MG tablet Take 1 tablet (50 mg total) by mouth every 6 (six) hours as needed for severe pain. 02/01/18 08/21/18  Milinda Pointer, MD    Allergies Patient has no known allergies.  Family History  Problem Relation Age of Onset  . Diabetes Mother   . Hypertension Mother   . Heart disease Father   . Cancer Father   . Heart disease Brother     Social History Social History   Tobacco Use  . Smoking status: Former Smoker    Years: 15.00    Types: Cigarettes    Quit date: 06/30/2004    Years since quitting: 16.3  . Smokeless tobacco: Never Used  Vaping Use  . Vaping Use: Never used  Substance Use Topics  . Alcohol use: Yes    Comment: OCC  . Drug use: No    Review of Systems Constitutional: No fever/chills Eyes: No visual changes. ENT: No sore throat. Cardiovascular: Endorses chest pain. Respiratory: Denies shortness of breath. Gastrointestinal: No abdominal pain.  No nausea, no vomiting.  No diarrhea. Genitourinary: Negative for dysuria. Musculoskeletal: Left neck pain.  Left upper extremity pain Skin: Negative for rash. Neurological: Negative for headaches, weakness/numbness/paresthesias in any extremity Psychiatric: Negative for suicidal ideation/homicidal ideation   ____________________________________________   PHYSICAL EXAM:  VITAL SIGNS: ED Triage Vitals  Enc Vitals Group     BP 10/30/20 0047 (!) 143/93     Pulse Rate 10/30/20 0047 99     Resp 10/30/20 0047 18     Temp 10/30/20 0047 99.2 F (37.3 C)     Temp Source 10/30/20 0047 Oral     SpO2 10/30/20 0047 99 %     Weight 10/30/20 0048 235 lb (106.6 kg)     Height 10/30/20 0048 5\' 2"  (1.575 m)     Head Circumference --      Peak Flow --      Pain Score 10/30/20 0048 9     Pain Loc --      Pain Edu? --      Excl. in [place]? --    Constitutional: Alert and oriented.  Obese, well appearing and in no acute distress. Eyes: Conjunctivae are normal. PERRL. Head: Atraumatic. Nose: No congestion/rhinnorhea. Mouth/Throat: Mucous membranes are moist. Neck: No stridor Cardiovascular: Grossly normal heart sounds.  Good  peripheral circulation. Respiratory: Normal respiratory effort.  No retractions. Gastrointestinal: Soft and nontender. No distention. Musculoskeletal: No obvious deformities Neurologic:  Normal speech and language. No gross focal neurologic deficits are appreciated. Skin:  Skin is warm and dry. No rash noted. Psychiatric: Mood and affect are normal. Speech and behavior are normal.  ____________________________________________   LABS (all labs ordered are listed, but only abnormal results are displayed)  Labs Reviewed  CBC - Abnormal; Notable for the following components:      Result Value   Hemoglobin 11.9 (*)    MCH 25.7 (*)    Platelets 401 (*)    All other components within normal limits  COMPREHENSIVE METABOLIC PANEL - Abnormal; Notable for the following components:   Potassium 3.3 (*)    Glucose, Bld 116 (*)    Calcium 8.8 (*)    Total Protein 8.2 (*)  AST 74 (*)    ALT 53 (*)    All other components within normal limits  TROPONIN I (HIGH SENSITIVITY)  TROPONIN I (HIGH SENSITIVITY)   ____________________________________________  EKG  ED ECG REPORT I, Naaman Plummer, the attending physician, personally viewed and interpreted this ECG.  Date: 10/30/2020 EKG Time: 0052 Rate: 96 Rhythm: normal sinus rhythm QRS Axis: normal Intervals: normal ST/T Wave abnormalities: normal Narrative Interpretation: no evidence of acute ischemia  ____________________________________________  RADIOLOGY  ED MD interpretation: 2 view x-ray of the chest shows no evidence of acute abnormalities including no pneumonia, pneumothorax, or widened mediastinum  Official radiology report(s): DG Chest 2 View  Result Date: 10/30/2020 CLINICAL DATA:  Chest pressure. EXAM: CHEST - 2 VIEW COMPARISON:  Mar 29, 2020 FINDINGS: The heart size and mediastinal contours are within normal limits. Both lungs are clear. The visualized skeletal structures are unremarkable. IMPRESSION: No active  cardiopulmonary disease. Electronically Signed   By: Virgina Norfolk M.D.   On: 10/30/2020 00:56    ____________________________________________   PROCEDURES  Procedure(s) performed (including Critical Care):  .1-3 Lead EKG Interpretation Performed by: Naaman Plummer, MD Authorized by: Naaman Plummer, MD     Interpretation: normal     ECG rate:  79   ECG rate assessment: normal     Rhythm: sinus rhythm     Ectopy: none     Conduction: normal       ____________________________________________   INITIAL IMPRESSION / ASSESSMENT AND PLAN / ED COURSE  As part of my medical decision making, I reviewed the following data within the [place] notes reviewed and incorporated, Labs reviewed, EKG interpreted, Old chart reviewed, Radiograph reviewed and Notes from prior ED visits reviewed and incorporated        Workup: ECG, CXR, CBC, BMP, Troponin Findings: ECG: No overt evidence of STEMI. No evidence of Brugadas sign, delta wave, epsilon wave, significantly prolonged QTc, or malignant arrhythmia HS Troponin: Negative x1 Other Labs unremarkable for emergent problems. CXR: Without PTX, PNA, or widened mediastinum Last Stress Test: Never Last Heart Catheterization: Never HEART Score: 1  Given History, Exam, and Workup I have low suspicion for ACS, Pneumothorax, Pneumonia, Pulmonary Embolus, Tamponade, Aortic Dissection or other emergent problem as a cause for this presentation.   Reassesment: Prior to discharge patients pain was controlled and they were well appearing.  Disposition:  Discharge. Strict return precautions discussed with patient with full understanding. Advised patient to follow up promptly with primary care provider       ____________________________________________   FINAL CLINICAL IMPRESSION(S) / ED DIAGNOSES  Final diagnoses:  Chest wall pain  Neck pain on left side  Diffuse pain in left upper extremity     ED  Discharge Orders         Ordered    cyclobenzaprine (FLEXERIL) 10 MG tablet  3 times daily PRN        10/30/20 1143           Note:  This document was prepared using Dragon voice recognition software and may include unintentional dictation errors.   Naaman Plummer, MD 10/30/20 1149

## 2020-10-30 NOTE — ED Notes (Signed)
VS obtained by this RN. Pt continues to sit in wheelchair at this time. NAD noted at this time.

## 2020-10-30 NOTE — ED Notes (Signed)
VS obtained by this RN. Pt visualized in NAD at this time. Pt speaking on phone at this time.

## 2020-10-30 NOTE — ED Notes (Signed)
Pt verbalized understanding of d/c instructions and denies further questions at this time. Pt ambulatory to lobby, visualized in NAD at this time.

## 2020-10-30 NOTE — ED Triage Notes (Signed)
Pt in with co chest pain since 0000 radiates to left arm, all wnl per EMS. Pt denies any recent illness or cold symptoms;

## 2021-03-15 ENCOUNTER — Emergency Department: Payer: Medicaid Other

## 2021-03-15 ENCOUNTER — Other Ambulatory Visit: Payer: Self-pay

## 2021-03-15 ENCOUNTER — Emergency Department
Admission: EM | Admit: 2021-03-15 | Discharge: 2021-03-15 | Disposition: A | Discharge status: Home / Self Care | Payer: Medicaid Other | Attending: Emergency Medicine | Admitting: Emergency Medicine

## 2021-03-15 DIAGNOSIS — I10 Essential (primary) hypertension: Secondary | ICD-10-CM | POA: Insufficient documentation

## 2021-03-15 DIAGNOSIS — Z79899 Other long term (current) drug therapy: Secondary | ICD-10-CM | POA: Insufficient documentation

## 2021-03-15 DIAGNOSIS — R0789 Other chest pain: Secondary | ICD-10-CM | POA: Insufficient documentation

## 2021-03-15 DIAGNOSIS — Z87891 Personal history of nicotine dependence: Secondary | ICD-10-CM | POA: Insufficient documentation

## 2021-03-15 LAB — BASIC METABOLIC PANEL
Anion gap: 9 (ref 5–15)
BUN: 8 mg/dL (ref 6–20)
CO2: 25 mmol/L (ref 22–32)
Calcium: 9.6 mg/dL (ref 8.9–10.3)
Chloride: 102 mmol/L (ref 98–111)
Creatinine, Ser: 0.74 mg/dL (ref 0.44–1.00)
GFR, Estimated: >60 mL/min (ref >60)
Glucose, Bld: 120 mg/dL — ABNORMAL HIGH (ref 70–99)
Potassium: 3.3 mmol/L — ABNORMAL LOW (ref 3.5–5.1)
Sodium: 136 mmol/L (ref 135–145)

## 2021-03-15 LAB — CBC
HCT: 42.7 % (ref 36.0–46.0)
Hemoglobin: 13.6 g/dL (ref 12.0–15.0)
MCH: 26.4 pg (ref 26.0–34.0)
MCHC: 31.9 g/dL (ref 30.0–36.0)
MCV: 82.9 fL (ref 80.0–100.0)
Platelets: 420 10*3/uL — ABNORMAL HIGH (ref 150–400)
RBC: 5.15 MIL/uL — ABNORMAL HIGH (ref 3.87–5.11)
RDW: 15.5 % (ref 11.5–15.5)
WBC: 9.6 10*3/uL (ref 4.0–10.5)
nRBC: 0 % (ref 0.0–0.2)

## 2021-03-15 LAB — TROPONIN I (HIGH SENSITIVITY): Troponin I (High Sensitivity): <2 ng/L (ref <18)

## 2021-03-15 NOTE — ED Provider Notes (Signed)
[facility] Emergency Department Provider Note   ____________________________________________    I have reviewed the triage vital signs and the nursing notes.   HISTORY  Chief Complaint Chest Pain     HPI Sherry Watkins is a 40 y.o. female presents with complaints of intermittent chest pain over the last several days.  Patient describes brief sharp left-sided chest discomfort that happens intermittently, not related to exertion.  No shortness of breath.  No pleurisy.  No nausea vomiting or diaphoresis.  Has not take anything for this.  She thinks this may be related to stress.  No calf pain or swelling.  No history of heart disease.  Past Medical History:  Diagnosis Date  . Complication of anesthesia   . Depression   . Endometriosis   . GERD (gastroesophageal reflux disease)    NO MEDS  . Headache    FREQUENTLY  . Hypertension   . Obesity   . Ovarian cyst     Patient Active Problem List   Diagnosis Date Noted  . Endometriosis 10/17/2018  . Adenomyosis 08/17/2018  . Chronic low back pain (Right) without sciatica 02/09/2018  . Elevated sed rate 02/01/2018  . Vitamin D deficiency 02/01/2018  . Chronic sacroiliac joint pain (Right) 02/01/2018  . Other specified dorsopathies, sacral and sacrococcygeal region 02/01/2018  . Chronic abdominal pain (RLQ) (Primary Area of Pain) (Right) 12/26/2017  . Chronic lower extremity pain (Secondary Area of Pain) (Right) 12/26/2017  . Chronic low back pain with right-sided sciatica [facility] Area of Pain) (Bilateral) (R>L) 12/26/2017  . Chronic pain syndrome 12/26/2017  . Opiate use 12/26/2017  . Problems influencing health status 12/26/2017  . Pharmacologic therapy 12/26/2017  . Disorder of skeletal system 12/26/2017    Past Surgical History:  Procedure Laterality Date  . ABLATION    . CESAREAN SECTION    . CYSTOSCOPY N/A 08/24/2018   Procedure: CYSTOSCOPY;  Surgeon: Will Bonnet, MD;   Location: [facility] ORS;  Service: Gynecology;  Laterality: N/A;  . HYSTEROSCOPY WITH NOVASURE N/A 05/27/2015   Procedure: HYSTEROSCOPY WITH NOVASURE;  Surgeon: Aletha Halim, MD;  Location: [facility] ORS;  Service: Gynecology;  Laterality: N/A;  . LAPAROSCOPY N/A 07/07/2017   Procedure: LAPAROSCOPY DIAGNOSTIC;  Surgeon: Malachy Mood, MD;  Location: [facility] ORS;  Service: Gynecology;  Laterality: N/A;  . PARTIAL HYSTERECTOMY    . ROBOTIC ASSISTED TOTAL HYSTERECTOMY Bilateral 08/24/2018   Procedure: ROBOTIC ASSISTED TOTAL HYSTERECTOMY BILATERAL SALPINGECTOMY;  Surgeon: Will Bonnet, MD;  Location: [facility] ORS;  Service: Gynecology;  Laterality: Bilateral;  . TUBAL LIGATION      Prior to Admission medications   Medication Sig Start Date End Date Taking? Authorizing Provider  benzonatate (TESSALON PERLES) 100 MG capsule Take 1-2 tabs TID prn cough 01/20/20   Menshew, Dannielle Karvonen, PA-C  famotidine (PEPCID) 20 MG tablet Take 1 tablet (20 mg total) by mouth 2 (two) times daily. 02/27/20 02/26/21  Merlyn Lot, MD  hydrochlorothiazide (HYDRODIURIL) 25 MG tablet Take 1 tablet (25 mg total) by mouth daily. 02/25/20   Earleen Newport, MD  Multiple Vitamins-Minerals (WOMENS MULTIVITAMIN PO) Take 1 tablet by mouth at bedtime.    [provider]  ondansetron (ZOFRAN ODT) 4 MG disintegrating tablet Take 1 tablet (4 mg total) by mouth every 8 (eight) hours as needed. 01/20/20   Menshew, Dannielle Karvonen, PA-C  potassium chloride (KLOR-CON M10) 10 MEQ tablet Take 1 tablet (10 mEq total) by mouth daily. 02/27/20   Merlyn Lot,  MD  traMADol (ULTRAM) 50 MG tablet Take 1 tablet (50 mg total) by mouth every 6 (six) hours as needed for severe pain. 02/01/18 08/21/18  Milinda Pointer, MD     Allergies Patient has no known allergies.  Family History  Problem Relation Age of Onset  . Diabetes Mother   . Hypertension Mother   . Heart disease Father   . Cancer Father   . Heart disease Brother      Social History Social History   Tobacco Use  . Smoking status: Former Smoker    Years: 15.00    Types: Cigarettes    Quit date: 06/30/2004    Years since quitting: 16.7  . Smokeless tobacco: Never Used  Vaping Use  . Vaping Use: Never used  Substance Use Topics  . Alcohol use: Yes    Comment: OCC  . Drug use: No    Review of Systems  Constitutional: No fever/chills Eyes: No visual changes.  ENT: No sore throat. Cardiovascular: As above. Respiratory: Denies shortness of breath. Gastrointestinal: No abdominal pain.  No nausea, no vomiting.   Genitourinary: Negative for dysuria. Musculoskeletal: Negative for back pain. Skin: Negative for rash. Neurological: Negative for headaches or weakness   ____________________________________________   PHYSICAL EXAM:  VITAL SIGNS: ED Triage Vitals  Enc Vitals Group     BP 03/15/21 1652 102/78     Pulse Rate 03/15/21 1652 88     Resp 03/15/21 1652 16     Temp 03/15/21 1652 98.2 F (36.8 C)     Temp Source 03/15/21 1652 Oral     SpO2 03/15/21 1652 100 %     Weight 03/15/21 1650 124.7 kg (275 lb)     Height 03/15/21 1650 1.575 m (5\' 2" )     Head Circumference --      Peak Flow --      Pain Score 03/15/21 1650 8     Pain Loc --      Pain Edu? --      Excl. in [place]? --     Constitutional: Alert and oriented.   Nose: No congestion/rhinnorhea. Mouth/Throat: Mucous membranes are moist.    Cardiovascular: Normal rate, regular rhythm. Grossly normal heart sounds.  Good peripheral circulation. Respiratory: Normal respiratory effort.  No retractions. Lungs CTAB. Gastrointestinal: Soft and nontender. No distention.    Musculoskeletal: No lower extremity tenderness nor edema.  Warm and well perfused Neurologic:  Normal speech and language. No gross focal neurologic deficits are appreciated.  Skin:  Skin is warm, dry and intact. No rash noted. Psychiatric: Mood and affect are normal. Speech and behavior are  normal.  ____________________________________________   LABS (all labs ordered are listed, but only abnormal results are displayed)  Labs Reviewed  BASIC METABOLIC PANEL - Abnormal; Notable for the following components:      Result Value   Potassium 3.3 (*)    Glucose, Bld 120 (*)    All other components within normal limits  CBC - Abnormal; Notable for the following components:   RBC 5.15 (*)    Platelets 420 (*)    All other components within normal limits  TROPONIN I (HIGH SENSITIVITY)   ____________________________________________  EKG  ED ECG REPORT I, Lavonia Drafts, the attending physician, personally viewed and interpreted this ECG.  Date: 03/15/2021  Rhythm: normal sinus rhythm QRS Axis: normal Intervals: normal ST/T Wave abnormalities: normal Narrative Interpretation: no evidence of acute ischemia  ____________________________________________  RADIOLOGY  Chest x-ray viewed by me, no abnormality  ____________________________________________   PROCEDURES  Procedure(s) performed: yes  .1-3 Lead EKG Interpretation Performed by: Lavonia Drafts, MD Authorized by: Lavonia Drafts, MD     Interpretation: normal     ECG rate assessment: normal     Rhythm: sinus rhythm     Ectopy: none     Conduction: normal       Critical Care performed: No ____________________________________________   INITIAL IMPRESSION / ASSESSMENT AND PLAN / ED COURSE  Pertinent labs & imaging results that were available during my care of the patient were reviewed by me and considered in my medical decision making (see chart for details).  Patient presents with atypical chest pain as described above, doubt ACS.  EKG is reassuring.  Chest x-ray unremarkable.  Exam is benign.  Patient is asymptomatic here in the emergency department and reports she has not any symptoms today, last episode was yesterday.  High sensitive troponin and lab work is reassuring  While the patient  follow-up closely with cardiology, return precautions discussed    ____________________________________________   FINAL CLINICAL IMPRESSION(S) / ED DIAGNOSES  Final diagnoses:  Atypical chest pain        Note:  This document was prepared using Dragon voice recognition software and may include unintentional dictation errors.   Lavonia Drafts, MD 03/15/21 2111

## 2021-03-15 NOTE — ED Triage Notes (Signed)
Pt arrives [facility] from home for CP intermittently. States sharp when it comes. Symptoms x few days. 324 asa given by EMS. VSS with EMS.

## 2021-03-30 ENCOUNTER — Telehealth: Payer: Self-pay | Admitting: Family

## 2021-03-30 ENCOUNTER — Emergency Department: Payer: Self-pay

## 2021-03-30 ENCOUNTER — Emergency Department
Admission: EM | Admit: 2021-03-30 | Discharge: 2021-03-30 | Disposition: A | Discharge status: Home / Self Care | Payer: Self-pay | Attending: Student in an Organized Health Care Education/Training Program | Admitting: Student in an Organized Health Care Education/Training Program

## 2021-03-30 DIAGNOSIS — M7989 Other specified soft tissue disorders: Secondary | ICD-10-CM | POA: Insufficient documentation

## 2021-03-30 DIAGNOSIS — Z87891 Personal history of nicotine dependence: Secondary | ICD-10-CM | POA: Insufficient documentation

## 2021-03-30 DIAGNOSIS — I10 Essential (primary) hypertension: Secondary | ICD-10-CM | POA: Insufficient documentation

## 2021-03-30 DIAGNOSIS — R0789 Other chest pain: Secondary | ICD-10-CM | POA: Insufficient documentation

## 2021-03-30 DIAGNOSIS — R609 Edema, unspecified: Secondary | ICD-10-CM

## 2021-03-30 DIAGNOSIS — Z79899 Other long term (current) drug therapy: Secondary | ICD-10-CM | POA: Insufficient documentation

## 2021-03-30 LAB — BASIC METABOLIC PANEL
Anion gap: 9 (ref 5–15)
BUN: 10 mg/dL (ref 6–20)
CO2: 26 mmol/L (ref 22–32)
Calcium: 9.1 mg/dL (ref 8.9–10.3)
Chloride: 102 mmol/L (ref 98–111)
Creatinine, Ser: 0.98 mg/dL (ref 0.44–1.00)
GFR, Estimated: >60 mL/min (ref >60)
Glucose, Bld: 97 mg/dL (ref 70–99)
Potassium: 2.8 mmol/L — ABNORMAL LOW (ref 3.5–5.1)
Sodium: 137 mmol/L (ref 135–145)

## 2021-03-30 LAB — CBC
HCT: 37.9 % (ref 36.0–46.0)
Hemoglobin: 12.3 g/dL (ref 12.0–15.0)
MCH: 26.9 pg (ref 26.0–34.0)
MCHC: 32.5 g/dL (ref 30.0–36.0)
MCV: 82.9 fL (ref 80.0–100.0)
Platelets: 381 10*3/uL (ref 150–400)
RBC: 4.57 MIL/uL (ref 3.87–5.11)
RDW: 15.9 % — ABNORMAL HIGH (ref 11.5–15.5)
WBC: 8.4 10*3/uL (ref 4.0–10.5)
nRBC: 0 % (ref 0.0–0.2)

## 2021-03-30 LAB — HEPATIC FUNCTION PANEL
ALT: 52 U/L — ABNORMAL HIGH (ref 0–44)
AST: 77 U/L — ABNORMAL HIGH (ref 15–41)
Albumin: 4 g/dL (ref 3.5–5.0)
Alkaline Phosphatase: 56 U/L (ref 38–126)
Bilirubin, Direct: <0.1 mg/dL (ref 0.0–0.2)
Total Bilirubin: 0.6 mg/dL (ref 0.3–1.2)
Total Protein: 8.2 g/dL — ABNORMAL HIGH (ref 6.5–8.1)

## 2021-03-30 LAB — TROPONIN I (HIGH SENSITIVITY)
Troponin I (High Sensitivity): 3 ng/L (ref <18)
Troponin I (High Sensitivity): 3 ng/L (ref <18)

## 2021-03-30 LAB — BRAIN NATRIURETIC PEPTIDE: B Natriuretic Peptide: 8.6 pg/mL (ref 0.0–100.0)

## 2021-03-30 MED ORDER — LORAZEPAM 0.5 MG PO TABS
0.5000 mg | ORAL_TABLET | Freq: Once | ORAL | Status: AC
  Administered 2021-03-30: 0.5 mg via ORAL
  Filled 2021-03-30: qty 1

## 2021-03-30 MED ORDER — POTASSIUM CHLORIDE CRYS ER 20 MEQ PO TBCR
40.0000 meq | EXTENDED_RELEASE_TABLET | Freq: Once | ORAL | Status: AC
  Administered 2021-03-30: 40 meq via ORAL
  Filled 2021-03-30: qty 2

## 2021-03-30 MED ORDER — POTASSIUM CHLORIDE CRYS ER 10 MEQ PO TBCR: 10.0000 meq | EXTENDED_RELEASE_TABLET | Freq: Two times a day (BID) | ORAL | 0 refills | Status: DC

## 2021-03-30 MED ORDER — FUROSEMIDE 20 MG PO TABS: 20.0000 mg | ORAL_TABLET | Freq: Every day | ORAL | 0 refills | Status: DC

## 2021-03-30 MED ORDER — FUROSEMIDE 40 MG PO TABS
40.0000 mg | ORAL_TABLET | Freq: Once | ORAL | Status: AC
  Administered 2021-03-30: 40 mg via ORAL
  Filled 2021-03-30: qty 1

## 2021-03-30 NOTE — ED Triage Notes (Signed)
Pt reports bilateral foot swelling since Friday, pt states "sometimes it happens because of my blood pressure but not like this, they won't come down."   Pt also reports chest pain, poor historian, unable to elaborate when it started. States "It comes and goes. It started maybe today or yesterday." Pt reports being seen last month for the same. Denies SOB. Unable to describe, states "it's not sharp"

## 2021-03-30 NOTE — ED Notes (Signed)
Ultrasound at bedside

## 2021-03-30 NOTE — ED Provider Notes (Signed)
[facility] Emergency Department Provider Note    Event Date/Time   First MD Initiated Contact with Patient 03/30/21 [phone]     (approximate)  I have reviewed the triage vital signs and the nursing notes.   HISTORY  Chief Complaint Leg Swelling and Chest Pain    HPI Sherry Watkins is a 40 y.o. female presents to the ER for evaluation of bilateral lower extremity swelling and edema as well as concern for elevated blood pressure.  Has been having some mild chest discomfort associated with there is no diaphoresis.  No nausea or vomiting.  Does have family history of CHF.  She does not have a personal history of CHF or cardiac disease.    Past Medical History:  Diagnosis Date  . Complication of anesthesia   . Depression   . Endometriosis   . GERD (gastroesophageal reflux disease)    NO MEDS  . Headache    FREQUENTLY  . Hypertension   . Obesity   . Ovarian cyst    Family History  Problem Relation Age of Onset  . Diabetes Mother   . Hypertension Mother   . Heart disease Father   . Cancer Father   . Heart disease Brother    Past Surgical History:  Procedure Laterality Date  . ABLATION    . CESAREAN SECTION    . CYSTOSCOPY N/A 08/24/2018   Procedure: CYSTOSCOPY;  Surgeon: Conard Novak, MD;  Location: [facility] ORS;  Service: Gynecology;  Laterality: N/A;  . HYSTEROSCOPY WITH NOVASURE N/A 05/27/2015   Procedure: HYSTEROSCOPY WITH NOVASURE;  Surgeon: Chardon Bing, MD;  Location: [facility] ORS;  Service: Gynecology;  Laterality: N/A;  . LAPAROSCOPY N/A 07/07/2017   Procedure: LAPAROSCOPY DIAGNOSTIC;  Surgeon: Vena Austria, MD;  Location: [facility] ORS;  Service: Gynecology;  Laterality: N/A;  . PARTIAL HYSTERECTOMY    . ROBOTIC ASSISTED TOTAL HYSTERECTOMY Bilateral 08/24/2018   Procedure: ROBOTIC ASSISTED TOTAL HYSTERECTOMY BILATERAL SALPINGECTOMY;  Surgeon: Conard Novak, MD;  Location: [facility] ORS;  Service: Gynecology;  Laterality: Bilateral;  .  TUBAL LIGATION     Patient Active Problem List   Diagnosis Date Noted  . Endometriosis 10/17/2018  . Adenomyosis 08/17/2018  . Chronic low back pain (Right) without sciatica 02/09/2018  . Elevated sed rate 02/01/2018  . Vitamin D deficiency 02/01/2018  . Chronic sacroiliac joint pain (Right) 02/01/2018  . Other specified dorsopathies, sacral and sacrococcygeal region 02/01/2018  . Chronic abdominal pain (RLQ) (Primary Area of Pain) (Right) 12/26/2017  . Chronic lower extremity pain (Secondary Area of Pain) (Right) 12/26/2017  . Chronic low back pain with right-sided sciatica [facility] Area of Pain) (Bilateral) (R>L) 12/26/2017  . Chronic pain syndrome 12/26/2017  . Opiate use 12/26/2017  . Problems influencing health status 12/26/2017  . Pharmacologic therapy 12/26/2017  . Disorder of skeletal system 12/26/2017      Prior to Admission medications   Medication Sig Start Date End Date Taking? Authorizing Provider  furosemide (LASIX) 20 MG tablet Take 1 tablet (20 mg total) by mouth daily. 03/30/21 03/30/22 Yes Willy Eddy, MD  potassium chloride (KLOR-CON) 10 MEQ tablet Take 1 tablet (10 mEq total) by mouth 2 (two) times daily. 03/30/21  Yes Willy Eddy, MD  benzonatate [facility] PERLES) 100 MG capsule Take 1-2 tabs TID prn cough 01/20/20   Menshew, Charlesetta Ivory, PA-C  famotidine (PEPCID) 20 MG tablet Take 1 tablet (20 mg total) by mouth 2 (two) times daily. 02/27/20 02/26/21  Willy Eddy, MD  hydrochlorothiazide (HYDRODIURIL) 25 MG tablet Take 1 tablet (25 mg total) by mouth daily. 02/25/20   Earleen Newport, MD  Multiple Vitamins-Minerals (WOMENS MULTIVITAMIN PO) Take 1 tablet by mouth at bedtime.    [provider]  ondansetron (ZOFRAN ODT) 4 MG disintegrating tablet Take 1 tablet (4 mg total) by mouth every 8 (eight) hours as needed. 01/20/20   Menshew, Dannielle Karvonen, PA-C  potassium chloride (KLOR-CON M10) 10 MEQ tablet Take 1 tablet (10 mEq total) by mouth  daily. 02/27/20   Merlyn Lot, MD  traMADol (ULTRAM) 50 MG tablet Take 1 tablet (50 mg total) by mouth every 6 (six) hours as needed for severe pain. 02/01/18 08/21/18  Milinda Pointer, MD    Allergies Patient has no known allergies.    Social History Social History   Tobacco Use  . Smoking status: Former Smoker    Years: 15.00    Types: Cigarettes    Quit date: 06/30/2004    Years since quitting: 16.7  . Smokeless tobacco: Never Used  Vaping Use  . Vaping Use: Never used  Substance Use Topics  . Alcohol use: Yes    Comment: OCC  . Drug use: No    Review of Systems Patient denies headaches, rhinorrhea, blurry vision, numbness, shortness of breath, chest pain, edema, cough, abdominal pain, nausea, vomiting, diarrhea, dysuria, fevers, rashes or hallucinations unless otherwise stated above in HPI. ____________________________________________   PHYSICAL EXAM:  VITAL SIGNS: Vitals:   03/30/21 0405 03/30/21 0500  BP: (!) 164/94 136/81  Pulse: 99 83  Resp: 20 18  Temp:    SpO2: 99% 96%    Constitutional: Alert and oriented.  Eyes: Conjunctivae are normal.  Head: Atraumatic. Nose: No congestion/rhinnorhea. Mouth/Throat: Mucous membranes are moist.   Neck: No stridor. Painless ROM.  Cardiovascular: Normal rate, regular rhythm. Grossly normal heart sounds.  Good peripheral circulation. Respiratory: Normal respiratory effort.  No retractions. Lungs CTAB. Gastrointestinal: Soft and nontender. No distention. No abdominal bruits. No CVA tenderness. Genitourinary:  Musculoskeletal: No lower extremity tenderness, 2+ bilateral lower extremity edema.  No joint effusions. Neurologic:  Normal speech and language. No gross focal neurologic deficits are appreciated. No facial droop Skin:  Skin is warm, dry and intact. No rash noted. Psychiatric: Mood and affect are normal. Speech and behavior are normal.  ____________________________________________   LABS (all labs ordered  are listed, but only abnormal results are displayed)  Results for orders placed or performed during the hospital encounter of 03/30/21 (from the past 24 hour(s))  Basic metabolic panel     Status: Abnormal   Collection Time: 03/30/21  1:46 AM  Result Value Ref Range   Sodium 137 135 - 145 mmol/L   Potassium 2.8 (L) 3.5 - 5.1 mmol/L   Chloride 102 98 - 111 mmol/L   CO2 26 22 - 32 mmol/L   Glucose, Bld 97 70 - 99 mg/dL   BUN 10 6 - 20 mg/dL   Creatinine, Ser 0.98 0.44 - 1.00 mg/dL   Calcium 9.1 8.9 - 10.3 mg/dL   GFR, Estimated >60 >60 mL/min   Anion gap 9 5 - 15  CBC     Status: Abnormal   Collection Time: 03/30/21  1:46 AM  Result Value Ref Range   WBC 8.4 4.0 - 10.5 K/uL   RBC 4.57 3.87 - 5.11 MIL/uL   Hemoglobin 12.3 12.0 - 15.0 g/dL   HCT 37.9 36.0 - 46.0 %   MCV 82.9 80.0 - 100.0 fL  MCH 26.9 26.0 - 34.0 pg   MCHC 32.5 30.0 - 36.0 g/dL   RDW 15.9 (H) 11.5 - 15.5 %   Platelets 381 150 - 400 K/uL   nRBC 0.0 0.0 - 0.2 %  Troponin I (High Sensitivity)     Status: None   Collection Time: 03/30/21  1:46 AM  Result Value Ref Range   Troponin I (High Sensitivity) 3 <18 ng/L  Brain natriuretic peptide     Status: None   Collection Time: 03/30/21  1:46 AM  Result Value Ref Range   B Natriuretic Peptide 8.6 0.0 - 100.0 pg/mL  Hepatic function panel     Status: Abnormal   Collection Time: 03/30/21  1:46 AM  Result Value Ref Range   Total Protein 8.2 (H) 6.5 - 8.1 g/dL   Albumin 4.0 3.5 - 5.0 g/dL   AST 77 (H) 15 - 41 U/L   ALT 52 (H) 0 - 44 U/L   Alkaline Phosphatase 56 38 - 126 U/L   Total Bilirubin 0.6 0.3 - 1.2 mg/dL   Bilirubin, Direct <0.1 0.0 - 0.2 mg/dL   Indirect Bilirubin NOT CALCULATED 0.3 - 0.9 mg/dL  Troponin I (High Sensitivity)     Status: None   Collection Time: 03/30/21  4:15 AM  Result Value Ref Range   Troponin I (High Sensitivity) 3 <18 ng/L   ____________________________________________  EKG My review and personal interpretation at Time: 1:49    Indication: edema  Rate: 90  Rhythm: sinus Axis: normal Other: normal intervals, no stemi, no depressions ____________________________________________  RADIOLOGY  I personally reviewed all radiographic images ordered to evaluate for the above acute complaints and reviewed radiology reports and findings.  These findings were personally discussed with the patient.  Please see medical record for radiology report.  ____________________________________________   PROCEDURES  Procedure(s) performed:  Procedures    Critical Care performed: no ____________________________________________   INITIAL IMPRESSION / ASSESSMENT AND PLAN / ED COURSE  Pertinent labs & imaging results that were available during my care of the patient were reviewed by me and considered in my medical decision making (see chart for details).   DDX: CHF, edema, DVT, ACS, electrolyte abnormality, anasarca, renal insufficiency  SHALECE STAFFA is a 40 y.o. who presents to the ED with presentation as described above.  Patient nontoxic.  No hypoxia.  EKG nonischemic.  Initial enzyme negative.  Possible CHF.  Will order DVT study.  Does not seem consistent with PE or infectious process.  Patient admits that she feels very anxious and would like something to help with.  Will order p.o. Ativan.  Clinical Course as of 03/30/21 0531  Mon Mar 30, 2021  9924 Serial enzymes negative.  Ultrasound results reassuring.  Discussed need for follow-up with PCP.  Given her edema and family history will be given referral to CHF clinic but she is not meeting any criteria for acute CHF at this time.  Discussed her low potassium will be given refill for potassium supplement.  Discussed signs and symptoms which she should return to the ER. [PR]    Clinical Course User Index [PR] Merlyn Lot, MD    The patient was evaluated in Emergency Department today for the symptoms described in the history of present illness. He/she was evaluated  in the context of the global COVID-19 pandemic, which necessitated consideration that the patient might be at risk for infection with the SARS-CoV-2 virus that causes COVID-19. Institutional protocols and algorithms that pertain to the evaluation  of patients at risk for COVID-19 are in a state of rapid change based on information released by regulatory bodies including the CDC and federal and state organizations. These policies and algorithms were followed during the patient's care in the ED.  As part of my medical decision making, I reviewed the following data within the [place] notes reviewed and incorporated, Labs reviewed, notes from prior ED visits and [place] Controlled Substance Database   ____________________________________________   FINAL CLINICAL IMPRESSION(S) / ED DIAGNOSES  Final diagnoses:  Peripheral edema      NEW MEDICATIONS STARTED DURING THIS VISIT:  New Prescriptions   FUROSEMIDE (LASIX) 20 MG TABLET    Take 1 tablet (20 mg total) by mouth daily.   POTASSIUM CHLORIDE (KLOR-CON) 10 MEQ TABLET    Take 1 tablet (10 mEq total) by mouth 2 (two) times daily.     Note:  This document was prepared using Dragon voice recognition software and may include unintentional dictation errors.    Merlyn Lot, MD 03/30/21 [phone]

## 2021-03-30 NOTE — Telephone Encounter (Signed)
LVM with patient after receiving a new patient referral from the ED after patients recent visit in attempt to schedule her for a CHF Clinic appointment.  Ronelle Smallman, NT

## 2021-04-01 ENCOUNTER — Ambulatory Visit: Payer: Self-pay | Attending: Family | Admitting: Family

## 2021-04-01 ENCOUNTER — Encounter: Payer: Self-pay | Admitting: Family

## 2021-04-01 ENCOUNTER — Other Ambulatory Visit: Payer: Self-pay

## 2021-04-01 ENCOUNTER — Other Ambulatory Visit
Admission: RE | Admit: 2021-04-01 | Discharge: 2021-04-01 | Disposition: A | Discharge status: Home / Self Care | Payer: Medicaid Other | Source: Ambulatory Visit | Attending: Family | Admitting: Family

## 2021-04-01 ENCOUNTER — Telehealth: Payer: Self-pay | Admitting: Family

## 2021-04-01 VITALS — BP 160/92 | HR 106 | Resp 18 | Ht 62.0 in | Wt 237.4 lb

## 2021-04-01 DIAGNOSIS — I110 Hypertensive heart disease with heart failure: Secondary | ICD-10-CM | POA: Insufficient documentation

## 2021-04-01 DIAGNOSIS — Z87891 Personal history of nicotine dependence: Secondary | ICD-10-CM | POA: Insufficient documentation

## 2021-04-01 DIAGNOSIS — E876 Hypokalemia: Secondary | ICD-10-CM | POA: Insufficient documentation

## 2021-04-01 DIAGNOSIS — K219 Gastro-esophageal reflux disease without esophagitis: Secondary | ICD-10-CM | POA: Insufficient documentation

## 2021-04-01 DIAGNOSIS — F419 Anxiety disorder, unspecified: Secondary | ICD-10-CM

## 2021-04-01 DIAGNOSIS — Z8249 Family history of ischemic heart disease and other diseases of the circulatory system: Secondary | ICD-10-CM | POA: Insufficient documentation

## 2021-04-01 DIAGNOSIS — R0602 Shortness of breath: Secondary | ICD-10-CM | POA: Insufficient documentation

## 2021-04-01 DIAGNOSIS — Z7901 Long term (current) use of anticoagulants: Secondary | ICD-10-CM | POA: Insufficient documentation

## 2021-04-01 DIAGNOSIS — Z79899 Other long term (current) drug therapy: Secondary | ICD-10-CM | POA: Insufficient documentation

## 2021-04-01 DIAGNOSIS — R600 Localized edema: Secondary | ICD-10-CM | POA: Insufficient documentation

## 2021-04-01 DIAGNOSIS — R42 Dizziness and giddiness: Secondary | ICD-10-CM | POA: Insufficient documentation

## 2021-04-01 DIAGNOSIS — I10 Essential (primary) hypertension: Secondary | ICD-10-CM

## 2021-04-01 DIAGNOSIS — R0789 Other chest pain: Secondary | ICD-10-CM | POA: Insufficient documentation

## 2021-04-01 DIAGNOSIS — I509 Heart failure, unspecified: Secondary | ICD-10-CM

## 2021-04-01 DIAGNOSIS — R5383 Other fatigue: Secondary | ICD-10-CM | POA: Insufficient documentation

## 2021-04-01 LAB — BASIC METABOLIC PANEL
Anion gap: 10 (ref 5–15)
BUN: 6 mg/dL (ref 6–20)
CO2: 24 mmol/L (ref 22–32)
Calcium: 9.1 mg/dL (ref 8.9–10.3)
Chloride: 103 mmol/L (ref 98–111)
Creatinine, Ser: 0.7 mg/dL (ref 0.44–1.00)
GFR, Estimated: >60 mL/min (ref >60)
Glucose, Bld: 101 mg/dL — ABNORMAL HIGH (ref 70–99)
Potassium: 3 mmol/L — ABNORMAL LOW (ref 3.5–5.1)
Sodium: 137 mmol/L (ref 135–145)

## 2021-04-01 MED ORDER — FUROSEMIDE 20 MG PO TABS: 20.0000 mg | ORAL_TABLET | Freq: Every day | ORAL | 3 refills | Status: DC

## 2021-04-01 MED ORDER — LOSARTAN POTASSIUM 50 MG PO TABS: 50.0000 mg | ORAL_TABLET | Freq: Every day | ORAL | 3 refills | Status: DC

## 2021-04-01 MED ORDER — POTASSIUM CHLORIDE CRYS ER 10 MEQ PO TBCR: 10.0000 meq | EXTENDED_RELEASE_TABLET | Freq: Two times a day (BID) | ORAL | 5 refills | Status: DC

## 2021-04-01 NOTE — Patient Instructions (Addendum)
Begin weighing daily and call for an overnight weight gain of > 2 pounds or a weekly weight gain of >5 pounds.   Try to keep sodium intake to around 2000mg  a day

## 2021-04-01 NOTE — Progress Notes (Signed)
[facility] - PHARMACIST COUNSELING NOTE  ADHERENCE ASSESSMENT    Do you ever forget to take your medication? [] Yes (1) [x] No (0)  Do you ever skip doses due to side effects? [] Yes (1) [x] No (0)  Do you have trouble affording your medicines? [] Yes (1) [x] No (0)  Are you ever unable to pick up your medication due to transportation difficulties? [] Yes (1) [x] No (0)  Do you ever stop taking your medications because you don't believe they are helping? [] Yes (1) [x] No (0)  Total score _0______     Guideline-Directed Medical Therapy/Evidence Based Medicine  ACE/ARB/ARNI: N/A Beta Blocker: N/A Aldosterone Antagonist: N/A Diuretic: furosemide    SUBJECTIVE  HPI:  Past Medical History:  Diagnosis Date   Complication of anesthesia    Depression    Endometriosis    GERD (gastroesophageal reflux disease)    NO MEDS   Headache    FREQUENTLY   Hypertension    Obesity    Ovarian cyst         OBJECTIVE   Vital signs: HR 106, BP 190/123, weight 107.7 kg ECHO: none  BMP Latest Ref Rng & Units 03/30/2021 03/15/2021 10/30/2020  Glucose 70 - 99 mg/dL 97 120(H) 116(H)  BUN 6 - 20 mg/dL 10 8 6   Creatinine 0.44 - 1.00 mg/dL 0.98 0.74 0.75  BUN/Creat Ratio 9 - 23 - - -  Sodium 135 - 145 mmol/L 137 136 139  Potassium 3.5 - 5.1 mmol/L 2.8(L) 3.3(L) 3.3(L)  Chloride 98 - 111 mmol/L 102 102 104  CO2 22 - 32 mmol/L 26 25 26   Calcium 8.9 - 10.3 mg/dL 9.1 9.6 8.8(L)    ASSESSMENT 40 yo F presenting to heart failure clinic for new patient visit. Pt was in the ED for edema on 5/9. K 2.8 during ED visit, replaced with KCl 40 mEq PO x 1 dose and discharged on KCl 10 mEq BID. PMH includes HTN, headache, GERD, and depression. Pts blood pressure elevated at 190/123 in clinic. No barriers to adherence identified during medication reconciliation.    PLAN HTN - Continue amlodipine 10 mg daily  - Begin taking losartan 50 mg daily  Edema - Continue  furosemide 20 mg daily and KCl 10 mEq twice daily  - Recheck K level - Schedule ECHO  GERD - Continue famotidine 20 mg daily  Pain - Continue acetaminophen use as needed - Avoid use of NSAIDs such as ibuprofen and naproxen due to increased risk of swelling and stomach ulceration  Sleep - Try using melatonin 3-5 mg at night for help with sleep   Time spent: 15 minutes  Benn Moulder, PharmD Pharmacy Resident  04/01/2021 8:43 AM   Current Outpatient Medications:    benzonatate (TESSALON PERLES) 100 MG capsule, Take 1-2 tabs TID prn cough, Disp: 30 capsule, Rfl: 0   famotidine (PEPCID) 20 MG tablet, Take 1 tablet (20 mg total) by mouth 2 (two) times daily., Disp: 30 tablet, Rfl: 1   furosemide (LASIX) 20 MG tablet, Take 1 tablet (20 mg total) by mouth daily., Disp: 10 tablet, Rfl: 0   hydrochlorothiazide (HYDRODIURIL) 25 MG tablet, Take 1 tablet (25 mg total) by mouth daily., Disp: 30 tablet, Rfl: 1   Multiple Vitamins-Minerals (WOMENS MULTIVITAMIN PO), Take 1 tablet by mouth at bedtime., Disp: , Rfl:    ondansetron (ZOFRAN ODT) 4 MG disintegrating tablet, Take 1 tablet (4 mg total) by mouth every 8 (eight) hours as needed., Disp: 15 tablet, Rfl:  0   potassium chloride (KLOR-CON M10) 10 MEQ tablet, Take 1 tablet (10 mEq total) by mouth daily., Disp: 14 tablet, Rfl: 0   potassium chloride (KLOR-CON) 10 MEQ tablet, Take 1 tablet (10 mEq total) by mouth 2 (two) times daily., Disp: 14 tablet, Rfl: 0   traMADol (ULTRAM) 50 MG tablet, Take 1 tablet (50 mg total) by mouth every 6 (six) hours as needed for severe pain., Disp: 120 tablet, Rfl: 0   COUNSELING POINTS/CLINICAL PEARLS  Losartan (Goal: 150 mg once daily)  Warn female patient to avoid pregnancy and to report a pregnancy that occurs during therapy.  Side effects may include dizziness, upper respiratory infection, nasal congestion, and back pain.  Warn patient to avoid use of potassium supplements or potassium-containing salt  substitutes unless they consult healthcare provider. Furosemide  Drug causes sun-sensitivity. Advise patient to use sunscreen and avoid tanning beds. Patient should avoid activities requiring coordination until drug effects are realized, as drug may cause dizziness, vertigo, or blurred vision. This drug may cause hyperglycemia, hyperuricemia, constipation, diarrhea, loss of appetite, nausea, vomiting, purpuric disorder, cramps, spasticity, asthenia, headache, paresthesia, or scaling eczema. Instruct patient to report unusual bleeding/bruising or signs/symptoms of hypotension, infection, pancreatitis, or ototoxicity (tinnitus, hearing impairment). Advise patient to report signs/symptoms of a severe skin reactions (flu-like symptoms, spreading red rash, or skin/mucous membrane blistering) or erythema multiforme. Instruct patient to eat high-potassium foods during drug therapy, as directed by healthcare professional.  Patient should not drink alcohol while taking this drug.  DRUGS TO AVOID IN HEART FAILURE  Drug or Class Mechanism  Analgesics NSAIDs COX-2 inhibitors Glucocorticoids  Sodium and water retention, increased systemic vascular resistance, decreased response to diuretics   Diabetes Medications Metformin Thiazolidinediones Rosiglitazone (Avandia) Pioglitazone (Actos) DPP4 Inhibitors Saxagliptin (Onglyza) Sitagliptin (Januvia)   Lactic acidosis Possible calcium channel blockade   Unknown  Antiarrhythmics Class I  Flecainide Disopyramide Class III Sotalol Other Dronedarone  Negative inotrope, proarrhythmic   Proarrhythmic, beta blockade  Negative inotrope  Antihypertensives Alpha Blockers Doxazosin Calcium Channel Blockers Diltiazem Verapamil Nifedipine Central Alpha Adrenergics Moxonidine Peripheral Vasodilators Minoxidil  Increases renin and aldosterone  Negative inotrope    Possible sympathetic withdrawal  Unknown   Anti-infective Itraconazole Amphotericin B  Negative inotrope Unknown  Hematologic Anagrelide Cilostazol   Possible inhibition of PD IV Inhibition of PD III causing arrhythmias  Neurologic/Psychiatric Stimulants Anti-Seizure Drugs Carbamazepine Pregabalin Antidepressants Tricyclics Citalopram Parkinsons Bromocriptine Pergolide Pramipexole Antipsychotics Clozapine Antimigraine Ergotamine Methysergide Appetite suppressants Bipolar Lithium  Peripheral alpha and beta agonist activity  Negative inotrope and chronotrope Calcium channel blockade  Negative inotrope, proarrhythmic Dose-dependent QT prolongation  Excessive serotonin activity/valvular damage Excessive serotonin activity/valvular damage Unknown  IgE mediated hypersensitivy, calcium channel blockade  Excessive serotonin activity/valvular damage Excessive serotonin activity/valvular damage Valvular damage  Direct myofibrillar degeneration, adrenergic stimulation  Antimalarials Chloroquine Hydroxychloroquine Intracellular inhibition of lysosomal enzymes  Urologic Agents Alpha Blockers Doxazosin Prazosin Tamsulosin Terazosin  Increased renin and aldosterone  Adapted from Page Carleene Overlie, et al. "Drugs That May Cause or Exacerbate Heart Failure: A Scientific Statement from the American Heart  Association." Circulation 2016; 134:e32-e69. DOI: 10.1161/CIR.0000000000000426   MEDICATION ADHERENCES TIPS AND STRATEGIES Taking medication as prescribed improves patient outcomes in heart failure (reduces hospitalizations, improves symptoms, increases survival) Side effects of medications can be managed by decreasing doses, switching agents, stopping drugs, or adding additional therapy. Please let someone in the [facility] know if you have having bothersome side effects so we can modify your regimen. Do not alter your  medication regimen without talking to [place].  Medication reminders can help patients remember  to take drugs on time. If you are missing or forgetting doses you can try linking behaviors, using pill boxes, or an electronic reminder like an alarm on your phone or an app. Some people can also get automated phone calls as medication reminders.

## 2021-04-01 NOTE — Telephone Encounter (Signed)
LM on patient's voicemail regarding BMP results that were obtained earlier today.   Renal function looks good. Potassium level from ED visit on 03/30/21 was 2.8 and she was treated in the ED. Potassium today was 3.0.   She is currently taking potassium 74meq BID.   Advised patient to increase her potassium to 34meq BID for the next 3 days and then resume 50meq BID.   Will recheck BMP at her next visit.

## 2021-04-01 NOTE — Progress Notes (Signed)
Patient ID: Sherry Watkins, female    DOB: 1981/08/05, 40 y.o.   MRN: [id number]  HPI  Ms Stambaugh is a 40 y/o female with a history of HTN, depression, GERD and previous tobacco use.   No echo has been done.   Was in the ED 03/30/21 due to peripheral edema. Ultrasound negative for DVT. Hypokalemia treated and she was released. Was in the ED 03/15/21 due to atypical chest pain where she was treated and released.   She presents today for her initial visit with a chief complaint of moderate shortness of breath with little exertion. She describes this as having been present for several months. She has associated fatigue, chest tightness, pedal edema, light-headedness, anxiety and difficulty sleeping along with this. She denies any abdominal distention, palpitations, chest pain or cough.   Does not have scales and hasn't ever seen cardiologist before. She says that she has taken her medications this morning but that her BP is "always high".   Past Medical History:  Diagnosis Date  . Complication of anesthesia   . Depression   . Endometriosis   . GERD (gastroesophageal reflux disease)    NO MEDS  . Headache    FREQUENTLY  . Hypertension   . Obesity   . Ovarian cyst    Past Surgical History:  Procedure Laterality Date  . ABLATION    . CESAREAN SECTION    . CYSTOSCOPY N/A 08/24/2018   Procedure: CYSTOSCOPY;  Surgeon: Will Bonnet, MD;  Location: [facility] ORS;  Service: Gynecology;  Laterality: N/A;  . HYSTEROSCOPY WITH NOVASURE N/A 05/27/2015   Procedure: HYSTEROSCOPY WITH NOVASURE;  Surgeon: Aletha Halim, MD;  Location: [facility] OR[facility];  Service: Gynecology;  Laterality: N/A;  . LAPAROSCOPY N/A 07/07/2017   Procedure: LAPAROSCOPY DIAGNOSTIC;  Surgeon: Malachy Mood, MD;  Location: [facility] OR[facility];  Service: Gynecology;  Laterality: N/A;  . PARTIAL HYSTERECTOMY    . ROBOTIC ASSISTED TOTAL HYSTERECTOMY Bilateral 08/24/2018   Procedure: ROBOTIC ASSISTED TOTAL HYSTERECTOMY BILATERAL SALPINGECTOMY;   Surgeon: Will Bonnet, MD;  Location: [facility] OR[facility];  Service: Gynecology;  Laterality: Bilateral;  . TUBAL LIGATION     Family History  Problem Relation Age of Onset  . Diabetes Mother   . Hypertension Mother   . Heart disease Father   . Cancer Father   . Heart disease Brother    Social History   Tobacco Use  . Smoking status: Former Smoker    Years: 15.00    Types: Cigarettes    Quit date: 06/30/2004    Years since quitting: 16.7  . Smokeless tobacco: Never Used  Substance Use Topics  . Alcohol use: Yes    Comment: OCC   No Known Allergies Prior to Admission medications   Medication Sig Start Date End Date Taking? Authorizing Provider  acetaminophen (TYLENOL) 325 MG tablet Take 650 mg by mouth every 6 (six) hours as needed.   Yes [provider]  amLODipine (NORVASC) 10 MG tablet Take 10 mg by mouth daily.   Yes [provider]  famotidine (PEPCID) 20 MG tablet Take 1 tablet (20 mg total) by mouth 2 (two) times daily. Patient taking differently: Take 20 mg by mouth daily. 02/27/20 02/26/21 Yes Merlyn Lot, MD  furosemide (LASIX) 20 MG tablet Take 1 tablet (20 mg total) by mouth daily. 03/30/21 03/30/22 Yes Merlyn Lot, MD  potassium chloride (KLOR-CON) 10 MEQ tablet Take 1 tablet (10 mEq total) by mouth 2 (two) times daily. 03/30/21  Yes Merlyn Lot, MD  traMADol (ULTRAM) 50 MG tablet Take 1 tablet (50 mg total) by mouth every 6 (six) hours as needed for severe pain. Patient not taking: Reported on 04/01/2021 02/01/18 08/21/18  Milinda Pointer, MD    Review of Systems  Constitutional: Positive for fatigue. Negative for appetite change.  HENT: Negative for congestion, postnasal drip and sore throat.   Eyes: Negative.   Respiratory: Positive for chest tightness and shortness of breath (with little exertion). Negative for cough.   Cardiovascular: Positive for leg swelling. Negative for chest pain and palpitations.  Gastrointestinal: Negative for  abdominal distention and abdominal pain.  Endocrine: Negative.   Genitourinary: Negative.   Musculoskeletal: Negative.   Skin: Negative.   Allergic/Immunologic: Negative.   Neurological: Positive for light-headedness. Negative for headaches.  Hematological: Negative for adenopathy. Does not bruise/bleed easily.  Psychiatric/Behavioral: Positive for sleep disturbance. Negative for dysphoric mood. The patient is nervous/anxious.    Vitals:   04/01/21 0819 04/01/21 0846  BP: (!) 190/123 (!) 160/92  Pulse: (!) 106   Resp: 18   SpO2: 100%   Weight: 237 lb 6 oz (107.7 kg)   Height: 5\' 2"  (1.575 m)    Wt Readings from Last 3 Encounters:  04/01/21 237 lb 6 oz (107.7 kg)  03/30/21 235 lb (106.6 kg)  03/15/21 275 lb (124.7 kg)   Lab Results  Component Value Date   CREATININE 0.98 03/30/2021   CREATININE 0.74 03/15/2021   CREATININE 0.75 10/30/2020    Physical Exam Vitals and nursing note reviewed.  Constitutional:      Appearance: She is well-developed.  HENT:     Head: Normocephalic and atraumatic.  Neck:     Vascular: No JVD.  Cardiovascular:     Rate and Rhythm: Regular rhythm. Tachycardia present.  Pulmonary:     Effort: Pulmonary effort is normal. No respiratory distress.     Breath sounds: No wheezing or rales.  Abdominal:     Palpations: Abdomen is soft.     Tenderness: There is no abdominal tenderness.  Musculoskeletal:     Cervical back: Neck supple.     Right lower leg: No tenderness. Edema (trace pitting) present.     Left lower leg: No tenderness. Edema (1+ pitting) present.     Comments: Anterior left chest wall tender to palpation  Skin:    General: Skin is warm and dry.  Neurological:     General: No focal deficit present.     Mental Status: She is alert and oriented to person, place, and time.  Psychiatric:        Mood and Affect: Mood is anxious. Affect is tearful.        Behavior: Behavior normal.    Assessment & Plan:  1: Possible heart failure  with unknown ejection fraction- - NYHA class III - euvolemic today - not weighing daily as she doesn't have any scales so a set of scales was given to her today and she was instructed to weigh every morning, write the weight down and call for an overnight weight gain of > 2 pounds or a weekly weight gain of > 5 pounds - echo scheduled for 04/16/21 - scheduled NP appt with cardiology (End) on 04/22/21 - not adding salt to her food but hasn't been reading food labels too closely; reviewed the importance of reading food labels to keep her daily sodium intake to 2000mg / day. Low sodium cookbook and written dietary information was given to her today - reports drinking 48 ounces of water  daily - BNP 03/30/21 was 8.6 - PharmD reconciled medications with the patient  2: HTN- - BP elevated today even upon recheck with manual cuff - will add losartan 50mg  daily - [facility] at 04/14/21 - BMP 03/30/21 reviewed and showed sodium 137, potassium 2.8, creatinine 0.98 and GFR >60  3: Hypokalemia- - repeat BMP today - currently taking potassium 21meq BID  4: Anxiety- - patient is tearful in the office because she just feels so bad and is concerned about her heart - emotional support provided - reviewed that her EKG was normal, BNP and troponin levels were normal - echo has been scheduled and cardiology f/u has also been scheduled   Medication bottles reviewed.   Return next week for recheck or sooner for any questions/problems before then.

## 2021-04-06 ENCOUNTER — Other Ambulatory Visit: Payer: Self-pay

## 2021-04-06 ENCOUNTER — Emergency Department: Payer: Medicaid Other

## 2021-04-06 ENCOUNTER — Emergency Department
Admission: EM | Admit: 2021-04-06 | Discharge: 2021-04-06 | Disposition: A | Discharge status: Home / Self Care | Payer: Medicaid Other | Attending: Emergency Medicine | Admitting: Emergency Medicine

## 2021-04-06 DIAGNOSIS — I10 Essential (primary) hypertension: Secondary | ICD-10-CM | POA: Insufficient documentation

## 2021-04-06 DIAGNOSIS — R0789 Other chest pain: Secondary | ICD-10-CM | POA: Insufficient documentation

## 2021-04-06 DIAGNOSIS — Z79899 Other long term (current) drug therapy: Secondary | ICD-10-CM | POA: Insufficient documentation

## 2021-04-06 DIAGNOSIS — Z87891 Personal history of nicotine dependence: Secondary | ICD-10-CM | POA: Insufficient documentation

## 2021-04-06 DIAGNOSIS — R0602 Shortness of breath: Secondary | ICD-10-CM | POA: Insufficient documentation

## 2021-04-06 LAB — BASIC METABOLIC PANEL
Anion gap: 8 (ref 5–15)
BUN: 9 mg/dL (ref 6–20)
CO2: 24 mmol/L (ref 22–32)
Calcium: 8.9 mg/dL (ref 8.9–10.3)
Chloride: 103 mmol/L (ref 98–111)
Creatinine, Ser: 0.71 mg/dL (ref 0.44–1.00)
GFR, Estimated: >60 mL/min (ref >60)
Glucose, Bld: 99 mg/dL (ref 70–99)
Potassium: 3.5 mmol/L (ref 3.5–5.1)
Sodium: 135 mmol/L (ref 135–145)

## 2021-04-06 LAB — CBC
HCT: 40.6 % (ref 36.0–46.0)
Hemoglobin: 13.1 g/dL (ref 12.0–15.0)
MCH: 26.7 pg (ref 26.0–34.0)
MCHC: 32.3 g/dL (ref 30.0–36.0)
MCV: 82.9 fL (ref 80.0–100.0)
Platelets: 458 10*3/uL — ABNORMAL HIGH (ref 150–400)
RBC: 4.9 MIL/uL (ref 3.87–5.11)
RDW: 15.9 % — ABNORMAL HIGH (ref 11.5–15.5)
WBC: 7.5 10*3/uL (ref 4.0–10.5)
nRBC: 0 % (ref 0.0–0.2)

## 2021-04-06 LAB — TROPONIN I (HIGH SENSITIVITY)
Troponin I (High Sensitivity): 3 ng/L (ref <18)
Troponin I (High Sensitivity): <2 ng/L (ref <18)

## 2021-04-06 MED ORDER — KETOROLAC TROMETHAMINE 30 MG/ML IJ SOLN
15.0000 mg | Freq: Once | INTRAMUSCULAR | Status: AC
Start: 2021-04-06 — End: 2021-04-06
  Administered 2021-04-06: 15 mg via INTRAVENOUS
  Filled 2021-04-06: qty 1

## 2021-04-06 MED ORDER — PANTOPRAZOLE SODIUM 40 MG PO TBEC: 40.0000 mg | DELAYED_RELEASE_TABLET | Freq: Every day | ORAL | 0 refills | Status: DC

## 2021-04-06 NOTE — ED Notes (Signed)
Pt provided ice water and phone as requested

## 2021-04-06 NOTE — ED Notes (Signed)
See triage note, pt reports left side chest pressure that started at 0200 this am. Denies N/V. Reports recent dx of CHF.  NAD noted Ambulatory

## 2021-04-06 NOTE — ED Notes (Signed)
Pt to restroom independently, NAd noted

## 2021-04-06 NOTE — ED Notes (Signed)
Pt requesting meds for continued cp, Dr Cherylann Banas notified

## 2021-04-06 NOTE — Discharge Instructions (Addendum)
Follow-up at the CHF clinic and with the cardiologist as scheduled.  You may start taking the Protonix that was prescribed today (this is a medication to decrease stomach acid and may help with some of the chest pain especially what you are experiencing at night.  Return to the ER for new, worsening, or persistent severe chest pain, shortness of breath, weakness or lightheadedness, or any other new or worsening symptoms that concern you.

## 2021-04-06 NOTE — ED Triage Notes (Signed)
Pt c/o chest pain since 2am this morning, tightness in nature. Denies any N/V/SOB or other sx with it.

## 2021-04-06 NOTE — ED Provider Notes (Signed)
[facility] Emergency Department Provider Note ____________________________________________   Event Date/Time   First MD Initiated Contact with Patient 04/06/21 [phone]     (approximate)  I have reviewed the triage vital signs and the nursing notes.   HISTORY  Chief Complaint Chest Pain    HPI Sherry Watkins is a 40 y.o. female with PMH as noted below including GERD, chronic pain syndrome, hypertension, and possible CHF (patient is currently being worked up) who presents with chest discomfort, cute onset around 2 AM, persistent since then, described as pressure.  She states that she has had the same type of pressure intermittently for the last few weeks, often occurring at night but sometimes also during the day.  It is not exertional.  It is not associated with lightheadedness but she does have some mild shortness of breath.  She has no nausea or vomiting.  She previously had bilateral leg swelling but was put on medication and states that it is improved.   Past Medical History:  Diagnosis Date  . Complication of anesthesia   . Depression   . Endometriosis   . GERD (gastroesophageal reflux disease)    NO MEDS  . Headache    FREQUENTLY  . Hypertension   . Obesity   . Ovarian cyst     Patient Active Problem List   Diagnosis Date Noted  . Endometriosis 10/17/2018  . Adenomyosis 08/17/2018  . Chronic low back pain (Right) without sciatica 02/09/2018  . Elevated sed rate 02/01/2018  . Vitamin D deficiency 02/01/2018  . Chronic sacroiliac joint pain (Right) 02/01/2018  . Other specified dorsopathies, sacral and sacrococcygeal region 02/01/2018  . Chronic abdominal pain (RLQ) (Primary Area of Pain) (Right) 12/26/2017  . Chronic lower extremity pain (Secondary Area of Pain) (Right) 12/26/2017  . Chronic low back pain with right-sided sciatica [facility] Area of Pain) (Bilateral) (R>L) 12/26/2017  . Chronic pain syndrome 12/26/2017  . Opiate use  12/26/2017  . Problems influencing health status 12/26/2017  . Pharmacologic therapy 12/26/2017  . Disorder of skeletal system 12/26/2017    Past Surgical History:  Procedure Laterality Date  . ABLATION    . CESAREAN SECTION    . CYSTOSCOPY N/A 08/24/2018   Procedure: CYSTOSCOPY;  Surgeon: Will Bonnet, MD;  Location: [facility] ORS;  Service: Gynecology;  Laterality: N/A;  . HYSTEROSCOPY WITH NOVASURE N/A 05/27/2015   Procedure: HYSTEROSCOPY WITH NOVASURE;  Surgeon: Aletha Halim, MD;  Location: [facility] ORS;  Service: Gynecology;  Laterality: N/A;  . LAPAROSCOPY N/A 07/07/2017   Procedure: LAPAROSCOPY DIAGNOSTIC;  Surgeon: Malachy Mood, MD;  Location: [facility] ORS;  Service: Gynecology;  Laterality: N/A;  . PARTIAL HYSTERECTOMY    . ROBOTIC ASSISTED TOTAL HYSTERECTOMY Bilateral 08/24/2018   Procedure: ROBOTIC ASSISTED TOTAL HYSTERECTOMY BILATERAL SALPINGECTOMY;  Surgeon: Will Bonnet, MD;  Location: [facility] ORS;  Service: Gynecology;  Laterality: Bilateral;  . TUBAL LIGATION      Prior to Admission medications   Medication Sig Start Date End Date Taking? Authorizing Provider  pantoprazole (PROTONIX) 40 MG tablet Take 1 tablet (40 mg total) by mouth daily. 04/06/21  Yes Arta Silence, MD  acetaminophen (TYLENOL) 325 MG tablet Take 650 mg by mouth every 6 (six) hours as needed.    [provider]  amLODipine (NORVASC) 10 MG tablet Take 10 mg by mouth daily.    [provider]  famotidine (PEPCID) 20 MG tablet Take 1 tablet (20 mg total) by mouth 2 (two) times daily. Patient taking differently:  Take 20 mg by mouth daily. 02/27/20 02/26/21  Merlyn Lot, MD  furosemide (LASIX) 20 MG tablet Take 1 tablet (20 mg total) by mouth daily. 04/01/21 04/01/22  Alisa Graff, FNP  losartan (COZAAR) 50 MG tablet Take 1 tablet (50 mg total) by mouth daily. 04/01/21 06/30/21  Alisa Graff, FNP  potassium chloride (KLOR-CON) 10 MEQ tablet Take 1 tablet (10 mEq total) by mouth 2  (two) times daily. And additional tablet twice a day for 3 days 04/01/21   Alisa Graff, FNP  traMADol (ULTRAM) 50 MG tablet Take 1 tablet (50 mg total) by mouth every 6 (six) hours as needed for severe pain. Patient not taking: Reported on 04/01/2021 02/01/18 08/21/18  Milinda Pointer, MD    Allergies Patient has no known allergies.  Family History  Problem Relation Age of Onset  . Diabetes Mother   . Hypertension Mother   . Heart disease Father   . Cancer Father   . Heart disease Brother     Social History Social History   Tobacco Use  . Smoking status: Former Smoker    Years: 15.00    Types: Cigarettes    Quit date: 06/30/2004    Years since quitting: 16.7  . Smokeless tobacco: Never Used  Vaping Use  . Vaping Use: Never used  Substance Use Topics  . Alcohol use: Yes    Comment: OCC  . Drug use: No    Review of Systems  Constitutional: No fever. Eyes: No redness. ENT: No sore throat. Cardiovascular: Positive for chest discomfort. Respiratory: Denies acute shortness of breath. Gastrointestinal: No vomiting or diarrhea.  Genitourinary: Negative for dysuria.  Musculoskeletal: Negative for back pain. Skin: Negative for rash. Neurological: Negative for headache.   ____________________________________________   PHYSICAL EXAM:  VITAL SIGNS: ED Triage Vitals  Enc Vitals Group     BP 04/06/21 0721 118/74     Pulse Rate 04/06/21 0721 78     Resp 04/06/21 0721 16     Temp 04/06/21 0721 98.4 F (36.9 C)     Temp Source 04/06/21 0721 Oral     SpO2 04/06/21 0721 100 %     Weight 04/06/21 0723 230 lb (104.3 kg)     Height 04/06/21 0723 5\' 2"  (1.575 m)     Head Circumference --      Peak Flow --      Pain Score 04/06/21 0727 9     Pain Loc --      Pain Edu? --      Excl. in [place]? --     Constitutional: Alert and oriented. Well appearing and in no acute distress. Eyes: Conjunctivae are normal.  Head: Atraumatic. Nose: No  congestion/rhinnorhea. Mouth/Throat: Mucous membranes are moist.   Neck: Normal range of motion.  Cardiovascular: Normal rate, regular rhythm. Grossly normal heart sounds.  Good peripheral circulation. Respiratory: Normal respiratory effort.  No retractions. Lungs CTAB. Gastrointestinal: No distention.  Musculoskeletal: No lower extremity edema.  Extremities warm and well perfused.  Neurologic:  Normal speech and language. No gross focal neurologic deficits are appreciated.  Skin:  Skin is warm and dry. No rash noted. Psychiatric: Mood and affect are normal. Speech and behavior are normal.  ____________________________________________   LABS (all labs ordered are listed, but only abnormal results are displayed)  Labs Reviewed  CBC - Abnormal; Notable for the following components:      Result Value   RDW 15.9 (*)    Platelets 458 (*)  All other components within normal limits  BASIC METABOLIC PANEL  POC URINE PREG, ED  TROPONIN I (HIGH SENSITIVITY)  TROPONIN I (HIGH SENSITIVITY)   ____________________________________________  EKG  ED ECG REPORT I, Arta Silence, the attending physician, personally viewed and interpreted this ECG.  Date: 04/06/2021 EKG Time: 0729 Rate: 75 Rhythm: normal sinus rhythm QRS Axis: normal Intervals: normal ST/T Wave abnormalities: normal Narrative Interpretation: no evidence of acute ischemia  ____________________________________________  RADIOLOGY  Chest x-ray interpreted by me shows no focal infiltrate or edema  ____________________________________________   PROCEDURES  Procedure(s) performed: No  Procedures  Critical Care performed: No ____________________________________________   INITIAL IMPRESSION / ASSESSMENT AND PLAN / ED COURSE  Pertinent labs & imaging results that were available during my care of the patient were reviewed by me and considered in my medical decision making (see chart for  details).  40 year old female with PMH as noted above including hypertension, GERD, chronic pain syndrome, and possible CHF presents with atypical, nonexertional chest discomfort since 2 AM and intermittently over the last few weeks.  I reviewed the past medical records in [place].  The patient was seen in the ED on 4/24 and 5/9 with similar symptoms and negative cardiac work-ups.  She was then seen in the CHF clinic last week and is scheduled for an echocardiogram and cardiology follow-up in the next several weeks.  She was noted to have lower extremity edema and had bilateral lower extremity ultrasound on 5/9 which was negative for DVT.  On exam, the patient is overall well-appearing.  Her vital signs are normal.  The physical exam is unremarkable.  Overall presentation is most consistent with musculoskeletal pain, GERD, or other benign etiology.  The patient states that she is compliant with all the medications and recommendations from the CHF clinic and has a follow-up later this week.  I have a low suspicion for acute cardiac etiology.  There is no clinical evidence for PE or other vascular etiology given the normal vital signs, intermittent nature of the pain, and well appearance of the patient.  We will obtain basic labs, troponins x2, and reassess.  ----------------------------------------- 10:56 AM on 04/06/2021 -----------------------------------------  Lab work-up is unremarkable.  Troponins are negative x2.  Chest x-ray is clear.  The patient reports improved pain after Toradol and appears comfortable.  She is stable for discharge home at this time.  I counseled her on the results of the work-up.  There may be a GERD component of this chest discomfort especially at night.  The patient was previously on famotidine but is no longer taking it.  I will start her on Protonix.  Return precautions given, and she expresses understanding.  She is following up with the CHF clinic later this  week.  ____________________________________________   FINAL CLINICAL IMPRESSION(S) / ED DIAGNOSES  Final diagnoses:  Atypical chest pain      NEW MEDICATIONS STARTED DURING THIS VISIT:  New Prescriptions   PANTOPRAZOLE (PROTONIX) 40 MG TABLET    Take 1 tablet (40 mg total) by mouth daily.     Note:  This document was prepared using Dragon voice recognition software and may include unintentional dictation errors.    Arta Silence, MD 04/06/21 1057

## 2021-04-06 NOTE — ED Notes (Signed)
Patient refusing blood work in triage

## 2021-04-07 ENCOUNTER — Emergency Department: Payer: Medicaid Other

## 2021-04-07 ENCOUNTER — Encounter: Payer: Self-pay | Admitting: Emergency Medicine

## 2021-04-07 ENCOUNTER — Other Ambulatory Visit: Payer: Self-pay

## 2021-04-07 DIAGNOSIS — R079 Chest pain, unspecified: Secondary | ICD-10-CM | POA: Insufficient documentation

## 2021-04-07 DIAGNOSIS — Z5321 Procedure and treatment not carried out due to patient leaving prior to being seen by health care provider: Secondary | ICD-10-CM | POA: Insufficient documentation

## 2021-04-07 NOTE — ED Notes (Signed)
Pt states her best arm for lab work is the L arm, states its sore and wants to wait to "let the people in the back" stick her

## 2021-04-07 NOTE — ED Triage Notes (Signed)
Pt arrived via POV with reports of L side chest pain that started tonight "before I came here"  Pt seen for chest pain yesterday too.  Pt states the pain goes down her L arm. Pt frequently taking deep breaths in triage.

## 2021-04-08 ENCOUNTER — Emergency Department
Admission: EM | Admit: 2021-04-08 | Discharge: 2021-04-08 | Disposition: A | Discharge status: Home / Self Care | Payer: Medicaid Other | Attending: Emergency Medicine | Admitting: Emergency Medicine

## 2021-04-08 NOTE — ED Notes (Signed)
No answer when called several times from lobby 

## 2021-04-09 ENCOUNTER — Encounter: Payer: Self-pay | Admitting: Pharmacist

## 2021-04-09 ENCOUNTER — Other Ambulatory Visit
Admission: RE | Admit: 2021-04-09 | Discharge: 2021-04-09 | Disposition: A | Discharge status: Home / Self Care | Payer: Medicaid Other | Source: Ambulatory Visit | Attending: Family | Admitting: Family

## 2021-04-09 ENCOUNTER — Encounter: Payer: Self-pay | Admitting: Family

## 2021-04-09 ENCOUNTER — Ambulatory Visit: Payer: Self-pay | Attending: Family | Admitting: Family

## 2021-04-09 ENCOUNTER — Other Ambulatory Visit: Payer: Self-pay

## 2021-04-09 VITALS — BP 136/83 | HR 75 | Resp 20 | Ht 62.0 in | Wt 235.2 lb

## 2021-04-09 DIAGNOSIS — E876 Hypokalemia: Secondary | ICD-10-CM | POA: Insufficient documentation

## 2021-04-09 DIAGNOSIS — I5032 Chronic diastolic (congestive) heart failure: Secondary | ICD-10-CM

## 2021-04-09 DIAGNOSIS — Z79899 Other long term (current) drug therapy: Secondary | ICD-10-CM | POA: Insufficient documentation

## 2021-04-09 DIAGNOSIS — I110 Hypertensive heart disease with heart failure: Secondary | ICD-10-CM | POA: Insufficient documentation

## 2021-04-09 DIAGNOSIS — Z87891 Personal history of nicotine dependence: Secondary | ICD-10-CM | POA: Insufficient documentation

## 2021-04-09 DIAGNOSIS — I10 Essential (primary) hypertension: Secondary | ICD-10-CM

## 2021-04-09 DIAGNOSIS — I509 Heart failure, unspecified: Secondary | ICD-10-CM | POA: Insufficient documentation

## 2021-04-09 LAB — BASIC METABOLIC PANEL
Anion gap: 9 (ref 5–15)
BUN: 7 mg/dL (ref 6–20)
CO2: 25 mmol/L (ref 22–32)
Calcium: 9.2 mg/dL (ref 8.9–10.3)
Chloride: 104 mmol/L (ref 98–111)
Creatinine, Ser: 0.75 mg/dL (ref 0.44–1.00)
GFR, Estimated: >60 mL/min (ref >60)
Glucose, Bld: 96 mg/dL (ref 70–99)
Potassium: 3.6 mmol/L (ref 3.5–5.1)
Sodium: 138 mmol/L (ref 135–145)

## 2021-04-09 MED ORDER — FUROSEMIDE 20 MG PO TABS: 20.0000 mg | ORAL_TABLET | Freq: Every day | ORAL | 3 refills | Status: DC

## 2021-04-09 NOTE — Patient Instructions (Addendum)
Continue weighing daily and call for an overnight weight gain of > 2 pounds or a weekly weight gain of >5 pounds.   Stop taking amlodipine.    Increase losartan to 2 tablets every morning

## 2021-04-09 NOTE — Progress Notes (Addendum)
[facility] - PHARMACIST COUNSELING NOTE  ADHERENCE ASSESSMENT    Do you ever forget to take your medication? [] Yes (1) [x] No (0)  Do you ever skip doses due to side effects? [] Yes (1) [x] No (0)  Do you have trouble affording your medicines? [] Yes (1) [x] No (0)  Are you ever unable to pick up your medication due to transportation difficulties? [] Yes (1) [x] No (0)  Do you ever stop taking your medications because you don't believe they are helping? [] Yes (1) [x] No (0)   Recommendations given to patient about increasing adherence: pt endorses adherence and denies any barriers to obtaining medications. Pt requesting refills for furosemide.   Guideline-Directed Medical Therapy/Evidence Based Medicine  ACE/ARB/ARNI: Losartan Beta Blocker: None Aldosterone Antagonist: None Diuretic: Furosemide    SUBJECTIVE  HPI: 40yo female with medical history for GRED, depression, and HTN presenting to the HF clinic for a follow up visit. Pt complaining of not getting sleep and has tried melatonin with no benefit. Pt also reports her BP has been high, however after reviewing BP log, BP readings are within goal. Pt also reports her legs have been swollen. Pt has had multiple ED visits for chest pain - 03/15/21, 03/30/21, 04/06/21.  Past Medical History:  Diagnosis Date  . Complication of anesthesia   . Depression   . Endometriosis   . GERD (gastroesophageal reflux disease)    NO MEDS  . Headache    FREQUENTLY  . Hypertension   . Obesity   . Ovarian cyst         OBJECTIVE   Vital signs: HR 75, BP 136/83, weight (pounds) 235 ECHO: Date: scheduled for 04/16/21  BMP Latest Ref Rng & Units 04/06/2021 04/01/2021 03/30/2021  Glucose 70 - 99 mg/dL 99 101(H) 97  BUN 6 - 20 mg/dL 9 6 10   Creatinine 0.44 - 1.00 mg/dL 0.71 0.70 0.98  BUN/Creat Ratio 9 - 23 - - -  Sodium 135 - 145 mmol/L 135 137 137  Potassium 3.5 - 5.1 mmol/L 3.5 3.0(L) 2.8(L)  Chloride 98  - 111 mmol/L 103 103 102  CO2 22 - 32 mmol/L 24 24 26   Calcium 8.9 - 10.3 mg/dL 8.9 9.1 9.1    ASSESSMENT/PLAN HTN -amlodipine discontinued 04/09/21 - see "Edema" -losartan increased to 100mg  daily  -continue furosemide 20mg  daily  Edema  -pt reported her legs were swollen; inquired about how long she has been on amlodipine - pt states she has been taking for "a while"; possible that amlodipine is contributing to her edema -amlodipine discontinued 04/09/2021  Hypokalemia -04/06/21 K 3.5 -continue KCl 35mEq BID -losartan dose also increased which may help with hypokalemia  GERD -continue famotidine 20mg  daily   Trouble sleeping -pt has tried melatonin 20mg  QHS with no benefit -follow up with PCP for sleep regimen -counseled pt on proper sleep hygiene   **Follow up echo to assess EF/LV function**  Time spent: 10 minutes  Sherilyn Banker, PharmD Pharmacy Resident  04/09/2021 9:55 AM    Current Outpatient Medications:  .  acetaminophen (TYLENOL) 500 MG tablet, Take 500 mg by mouth every 6 (six) hours as needed., Disp: , Rfl:  .  aspirin EC 81 MG tablet, Take 81 mg by mouth daily. Swallow whole., Disp: , Rfl:  .  famotidine (PEPCID) 20 MG tablet, Take 1 tablet (20 mg total) by mouth 2 (two) times daily. (Patient taking differently: Take 20 mg by mouth daily.), Disp: 30 tablet, Rfl: 1 .  furosemide (  LASIX) 20 MG tablet, Take 1 tablet (20 mg total) by mouth daily., Disp: 90 tablet, Rfl: 3 .  losartan (COZAAR) 50 MG tablet, Take 1 tablet (50 mg total) by mouth daily. (Patient taking differently: Take 100 mg by mouth daily.), Disp: 90 tablet, Rfl: 3 .  Melatonin 10 MG CAPS, Take 20 mg by mouth at bedtime., Disp: , Rfl:  .  pantoprazole (PROTONIX) 40 MG tablet, Take 1 tablet (40 mg total) by mouth daily. (Patient not taking: Reported on 04/09/2021), Disp: 30 tablet, Rfl: 0 .  potassium chloride (KLOR-CON) 10 MEQ tablet, Take 1 tablet (10 mEq total) by mouth 2 (two) times daily. And  additional tablet twice a day for 3 days (Patient taking differently: Take 10 mEq by mouth 2 (two) times daily.), Disp: 64 tablet, Rfl: 5 .  traMADol (ULTRAM) 50 MG tablet, Take 1 tablet (50 mg total) by mouth every 6 (six) hours as needed for severe pain. (Patient not taking: No sig reported), Disp: 120 tablet, Rfl: 0   COUNSELING POINTS/CLINICAL PEARLS Losartan (Goal: 150 mg once daily)  Warn female patient to avoid pregnancy and to report a pregnancy that occurs during therapy.  Side effects may include dizziness, upper respiratory infection, nasal congestion, and back pain.  Warn patient to avoid use of potassium supplements or potassium-containing salt substitutes unless they consult healthcare provider. Furosemide  Drug causes sun-sensitivity. Advise patient to use sunscreen and avoid tanning beds. Patient should avoid activities requiring coordination until drug effects are realized, as drug may cause dizziness, vertigo, or blurred vision. This drug may cause hyperglycemia, hyperuricemia, constipation, diarrhea, loss of appetite, nausea, vomiting, purpuric disorder, cramps, spasticity, asthenia, headache, paresthesia, or scaling eczema. Instruct patient to report unusual bleeding/bruising or signs/symptoms of hypotension, infection, pancreatitis, or ototoxicity (tinnitus, hearing impairment). Advise patient to report signs/symptoms of a severe skin reactions (flu-like symptoms, spreading red rash, or skin/mucous membrane blistering) or erythema multiforme. Instruct patient to eat high-potassium foods during drug therapy, as directed by healthcare professional.  Patient should not drink alcohol while taking this drug. DRUGS TO AVOID IN HEART FAILURE  Drug or Class Mechanism  Analgesics . NSAIDs . COX-2 inhibitors . Glucocorticoids  Sodium and water retention, increased systemic vascular resistance, decreased response to diuretics   Diabetes  Medications . Metformin . Thiazolidinediones o Rosiglitazone (Avandia) o Pioglitazone (Actos) . DPP4 Inhibitors o Saxagliptin (Onglyza) o Sitagliptin (Januvia)   Lactic acidosis Possible calcium channel blockade   Unknown  Antiarrhythmics . Class I  o Flecainide o Disopyramide . Class III o Sotalol . Other o Dronedarone  Negative inotrope, proarrhythmic   Proarrhythmic, beta blockade  Negative inotrope  Antihypertensives . Alpha Blockers o Doxazosin . Calcium Channel Blockers o Diltiazem o Verapamil o Nifedipine . Central Alpha Adrenergics o Moxonidine . Peripheral Vasodilators o Minoxidil  Increases renin and aldosterone  Negative inotrope    Possible sympathetic withdrawal  Unknown  Anti-infective . Itraconazole . Amphotericin B  Negative inotrope Unknown  Hematologic . Anagrelide . Cilostazol   Possible inhibition of PD IV Inhibition of PD III causing arrhythmias  Neurologic/Psychiatric . Stimulants . Anti-Seizure Drugs o Carbamazepine o Pregabalin . Antidepressants o Tricyclics o Citalopram . Parkinsons o Bromocriptine o Pergolide o Pramipexole . Antipsychotics o Clozapine . Antimigraine o Ergotamine o Methysergide . Appetite suppressants . Bipolar o Lithium  Peripheral alpha and beta agonist activity  Negative inotrope and chronotrope Calcium channel blockade  Negative inotrope, proarrhythmic Dose-dependent QT prolongation  Excessive serotonin activity/valvular damage Excessive serotonin activity/valvular damage Unknown  IgE mediated hypersensitivy, calcium channel blockade  Excessive serotonin activity/valvular damage Excessive serotonin activity/valvular damage Valvular damage  Direct myofibrillar degeneration, adrenergic stimulation  Antimalarials . Chloroquine . Hydroxychloroquine Intracellular inhibition of lysosomal enzymes  Urologic Agents . Alpha  Blockers o Doxazosin o Prazosin o Tamsulosin o Terazosin  Increased renin and aldosterone  Adapted from Page RL, et al. "Drugs That May Cause or Exacerbate Heart Failure: A Scientific Statement from the [place]." Circulation 2016; 921:J94-R74. DOI: 10.1161/CIR.0000000000000426   MEDICATION ADHERENCES TIPS AND STRATEGIES 1. Taking medication as prescribed improves patient outcomes in heart failure (reduces hospitalizations, improves symptoms, increases survival) 2. Side effects of medications can be managed by decreasing doses, switching agents, stopping drugs, or adding additional therapy. Please let someone in the [facility] know if you have having bothersome side effects so we can modify your regimen. Do not alter your medication regimen without talking to [place].  3. Medication reminders can help patients remember to take drugs on time. If you are missing or forgetting doses you can try linking behaviors, using pill boxes, or an electronic reminder like an alarm on your phone or an app. Some people can also get automated phone calls as medication reminders.

## 2021-04-09 NOTE — Progress Notes (Signed)
Patient ID: Sherry Watkins, female    DOB: Mar 30, 1981, 40 y.o.   MRN: [id number]   Sherry Watkins is a 40 y/o female with a history of HTN, depression, GERD and previous tobacco use.   No echo has been done.   Was in the ED 04/08/21 but LWBS. Was in the ED 04/06/21 due to chest discomfort. Troponins were negative. Pain improved after toradol and she was also placed on protonix and was released. Was in the ED 03/30/21 due to peripheral edema. Ultrasound negative for DVT. Hypokalemia treated and she was released. Was in the ED 03/15/21 due to atypical chest pain where she was treated and released.   She presents today for a follow-up visit with a chief complaint of moderate shortness of breath with little exertion. She describes this as having been present for several months. She has associated fatigue, intermittent chest tightness, pedal edema (improvign), light-headedness, anxiety and difficulty sleeping along with this. She denies any abdominal distention, palpitations, chest pain, cough or weight gain.   Has worn compression socks at work but typically doesn't wear them at home. Has tolerated losartan without known side effects.   Past Medical History:  Diagnosis Date  . Complication of anesthesia   . Depression   . Endometriosis   . GERD (gastroesophageal reflux disease)    NO MEDS  . Headache    FREQUENTLY  . Hypertension   . Obesity   . Ovarian cyst    Past Surgical History:  Procedure Laterality Date  . ABLATION    . CESAREAN SECTION    . CYSTOSCOPY N/A 08/24/2018   Procedure: CYSTOSCOPY;  Surgeon: Will Bonnet, MD;  Location: [facility];  Service: Gynecology;  Laterality: N/A;  . HYSTEROSCOPY WITH NOVASURE N/A 05/27/2015   Procedure: HYSTEROSCOPY WITH NOVASURE;  Surgeon: Aletha Halim, MD;  Location: [facility];  Service: Gynecology;  Laterality: N/A;  . LAPAROSCOPY N/A 07/07/2017   Procedure: LAPAROSCOPY DIAGNOSTIC;  Surgeon: Malachy Mood, MD;  Location: [facility];  Service:  Gynecology;  Laterality: N/A;  . PARTIAL HYSTERECTOMY    . ROBOTIC ASSISTED TOTAL HYSTERECTOMY Bilateral 08/24/2018   Procedure: ROBOTIC ASSISTED TOTAL HYSTERECTOMY BILATERAL SALPINGECTOMY;  Surgeon: Will Bonnet, MD;  Location: [facility];  Service: Gynecology;  Laterality: Bilateral;  . TUBAL LIGATION     Family History  Problem Relation Age of Onset  . Diabetes Mother   . Hypertension Mother   . Heart disease Father   . Cancer Father   . Heart disease Brother    Social History   Tobacco Use  . Smoking status: Former Smoker    Years: 15.00    Types: Cigarettes    Quit date: 06/30/2004    Years since quitting: 16.7  . Smokeless tobacco: Never Used  Substance Use Topics  . Alcohol use: Yes    Comment: OCC   No Known Allergies  Prior to Admission medications   Medication Sig Start Date End Date Taking? Authorizing Provider  acetaminophen (TYLENOL) 500 MG tablet Take 500 mg by mouth every 6 (six) hours as needed.   Yes [provider]  amLODipine (NORVASC) 10 MG tablet Take 10 mg by mouth daily.   Yes [provider]  aspirin EC 81 MG tablet Take 81 mg by mouth daily. Swallow whole.   Yes [provider]  famotidine (PEPCID) 20 MG tablet Take 1 tablet (20 mg total) by mouth 2 (two) times daily. Patient taking differently: Take 20 mg by mouth daily. 02/27/20 02/26/21  Yes Merlyn Lot, MD  furosemide (LASIX) 20 MG tablet Take 1 tablet (20 mg total) by mouth daily. 04/01/21 04/01/22 Yes Luzelena Heeg, Otila Kluver A, FNP  losartan (COZAAR) 50 MG tablet Take 1 tablet (50 mg total) by mouth daily. 04/01/21 06/30/21 Yes Arieon Corcoran A, FNP  Melatonin 10 MG CAPS Take 20 mg by mouth at bedtime.   Yes [provider]  potassium chloride (KLOR-CON) 10 MEQ tablet Take 1 tablet (10 mEq total) by mouth 2 (two) times daily. And additional tablet twice a day for 3 days Patient taking differently: Take 10 mEq by mouth 2 (two) times daily. 04/01/21  Yes Donis Kotowski, Otila Kluver A, FNP   pantoprazole (PROTONIX) 40 MG tablet Take 1 tablet (40 mg total) by mouth daily. Patient not taking: Reported on 04/09/2021 04/06/21   Arta Silence, MD  traMADol (ULTRAM) 50 MG tablet Take 1 tablet (50 mg total) by mouth every 6 (six) hours as needed for severe pain. Patient not taking: No sig reported 02/01/18 08/21/18  Milinda Pointer, MD    Review of Systems  Constitutional: Positive for fatigue. Negative for appetite change.  HENT: Negative for congestion, postnasal drip and sore throat.   Eyes: Negative.   Respiratory: Positive for chest tightness and shortness of breath (with little exertion). Negative for cough.   Cardiovascular: Positive for leg swelling. Negative for chest pain and palpitations.  Gastrointestinal: Negative for abdominal distention and abdominal pain.  Endocrine: Negative.   Genitourinary: Negative.   Musculoskeletal: Negative.   Skin: Negative.   Allergic/Immunologic: Negative.   Neurological: Positive for light-headedness. Negative for headaches.  Hematological: Negative for adenopathy. Does not bruise/bleed easily.  Psychiatric/Behavioral: Positive for sleep disturbance. Negative for dysphoric mood. The patient is nervous/anxious.    Vitals:   04/09/21 0857  BP: 136/83  Pulse: 75  Resp: 20  SpO2: 99%  Weight: 235 lb 4 oz (106.7 kg)  Height: 5\' 2"  (1.575 m)   Wt Readings from Last 3 Encounters:  04/09/21 235 lb 4 oz (106.7 kg)  04/07/21 230 lb (104.3 kg)  04/06/21 230 lb (104.3 kg)   Lab Results  Component Value Date   CREATININE 0.71 04/06/2021   CREATININE 0.70 04/01/2021   CREATININE 0.98 03/30/2021    Physical Exam Vitals and nursing note reviewed.  Constitutional:      Appearance: She is well-developed.  HENT:     Head: Normocephalic and atraumatic.  Neck:     Vascular: No JVD.  Cardiovascular:     Rate and Rhythm: Normal rate and regular rhythm.  Pulmonary:     Effort: Pulmonary effort is normal. No respiratory distress.      Breath sounds: No wheezing or rales.  Abdominal:     Palpations: Abdomen is soft.     Tenderness: There is no abdominal tenderness.  Musculoskeletal:        General: No tenderness.     Cervical back: Neck supple.     Right lower leg: No tenderness. Edema (trace pitting) present.     Left lower leg: No tenderness. Edema (trace pitting) present.     Comments: Anterior left chest wall tender to palpation  Skin:    General: Skin is warm and dry.  Neurological:     General: No focal deficit present.     Mental Status: She is alert and oriented to person, place, and time.  Psychiatric:        Mood and Affect: Mood is anxious.        Behavior: Behavior normal.  Assessment & Plan:  1: Heart failure with preserved ejection fraction- - NYHA class III - euvolemic today - weighing daily; reminded to call for an overnight weight gain of > 2 pounds or a weekly weight gain of > 5 pounds - weight down 2 pounds from last visit here 1 week ago - echo scheduled for 04/16/21 - has NP appt with cardiology (End) on 04/22/21 - not adding salt to her food but hasn't been reading food labels too closely; reviewed the importance of reading food labels to keep her daily sodium intake to 2000mg / day.  - encouraged her to wear her compression socks daily with removal at bedtime - reports drinking 48 ounces of water daily - BNP 03/30/21 was 8.6 - PharmD reconciled medications with the patient  2: HTN- - BP improved - increase losartan to 100mg  daily; recheck BMP next visit - will stop amlodipine and see if edema improves - sees PCP at [facility] at 04/14/21 - BMP 04/06/21 reviewed and showed sodium 135, potassium 3.5, creatinine 0.71 and GFR >60  3: Hypokalemia- - repeat BMP today - currently taking potassium 78meq BID   Medication bottles reviewed.   Return in 3 weeks after echo and cardiology appointment, sooner if needed.

## 2021-04-10 ENCOUNTER — Encounter: Payer: Self-pay | Admitting: Family

## 2021-04-16 ENCOUNTER — Other Ambulatory Visit: Payer: Self-pay

## 2021-04-16 ENCOUNTER — Ambulatory Visit
Admission: RE | Admit: 2021-04-16 | Discharge: 2021-04-16 | Disposition: A | Discharge status: Home / Self Care | Payer: Medicaid Other | Source: Ambulatory Visit | Attending: Family | Admitting: Family

## 2021-04-16 DIAGNOSIS — I5030 Unspecified diastolic (congestive) heart failure: Secondary | ICD-10-CM

## 2021-04-16 DIAGNOSIS — I509 Heart failure, unspecified: Secondary | ICD-10-CM | POA: Insufficient documentation

## 2021-04-16 DIAGNOSIS — I110 Hypertensive heart disease with heart failure: Secondary | ICD-10-CM | POA: Insufficient documentation

## 2021-04-16 LAB — ECHOCARDIOGRAM COMPLETE
AR max vel: 2.16 cm2
AV Area VTI: 2.26 cm2
AV Area mean vel: 1.81 cm2
AV Mean grad: 5 mmHg
AV Peak grad: 8.9 mmHg
Ao pk vel: 1.49 m/s
Area-P 1/2: 3.76 cm2
MV VTI: 2.4 cm2
S' Lateral: 2.1 cm

## 2021-04-16 NOTE — Progress Notes (Signed)
*  PRELIMINARY RESULTS* Echocardiogram 2D Echocardiogram has been performed.  Sherry Watkins Sherry Watkins 04/16/2021, 10:51 AM

## 2021-04-18 IMAGING — CR DG CHEST 2V
1 series · 2 of 2 positions shown · non-contrast
Comparison: March 29, 2020

CLINICAL DATA: Chest pressure.

EXAM:
CHEST - 2 VIEW

[Series 1: dg chest 2 view · 0.14mm/px · 2 of 2 slices shown]
[im 1/2]
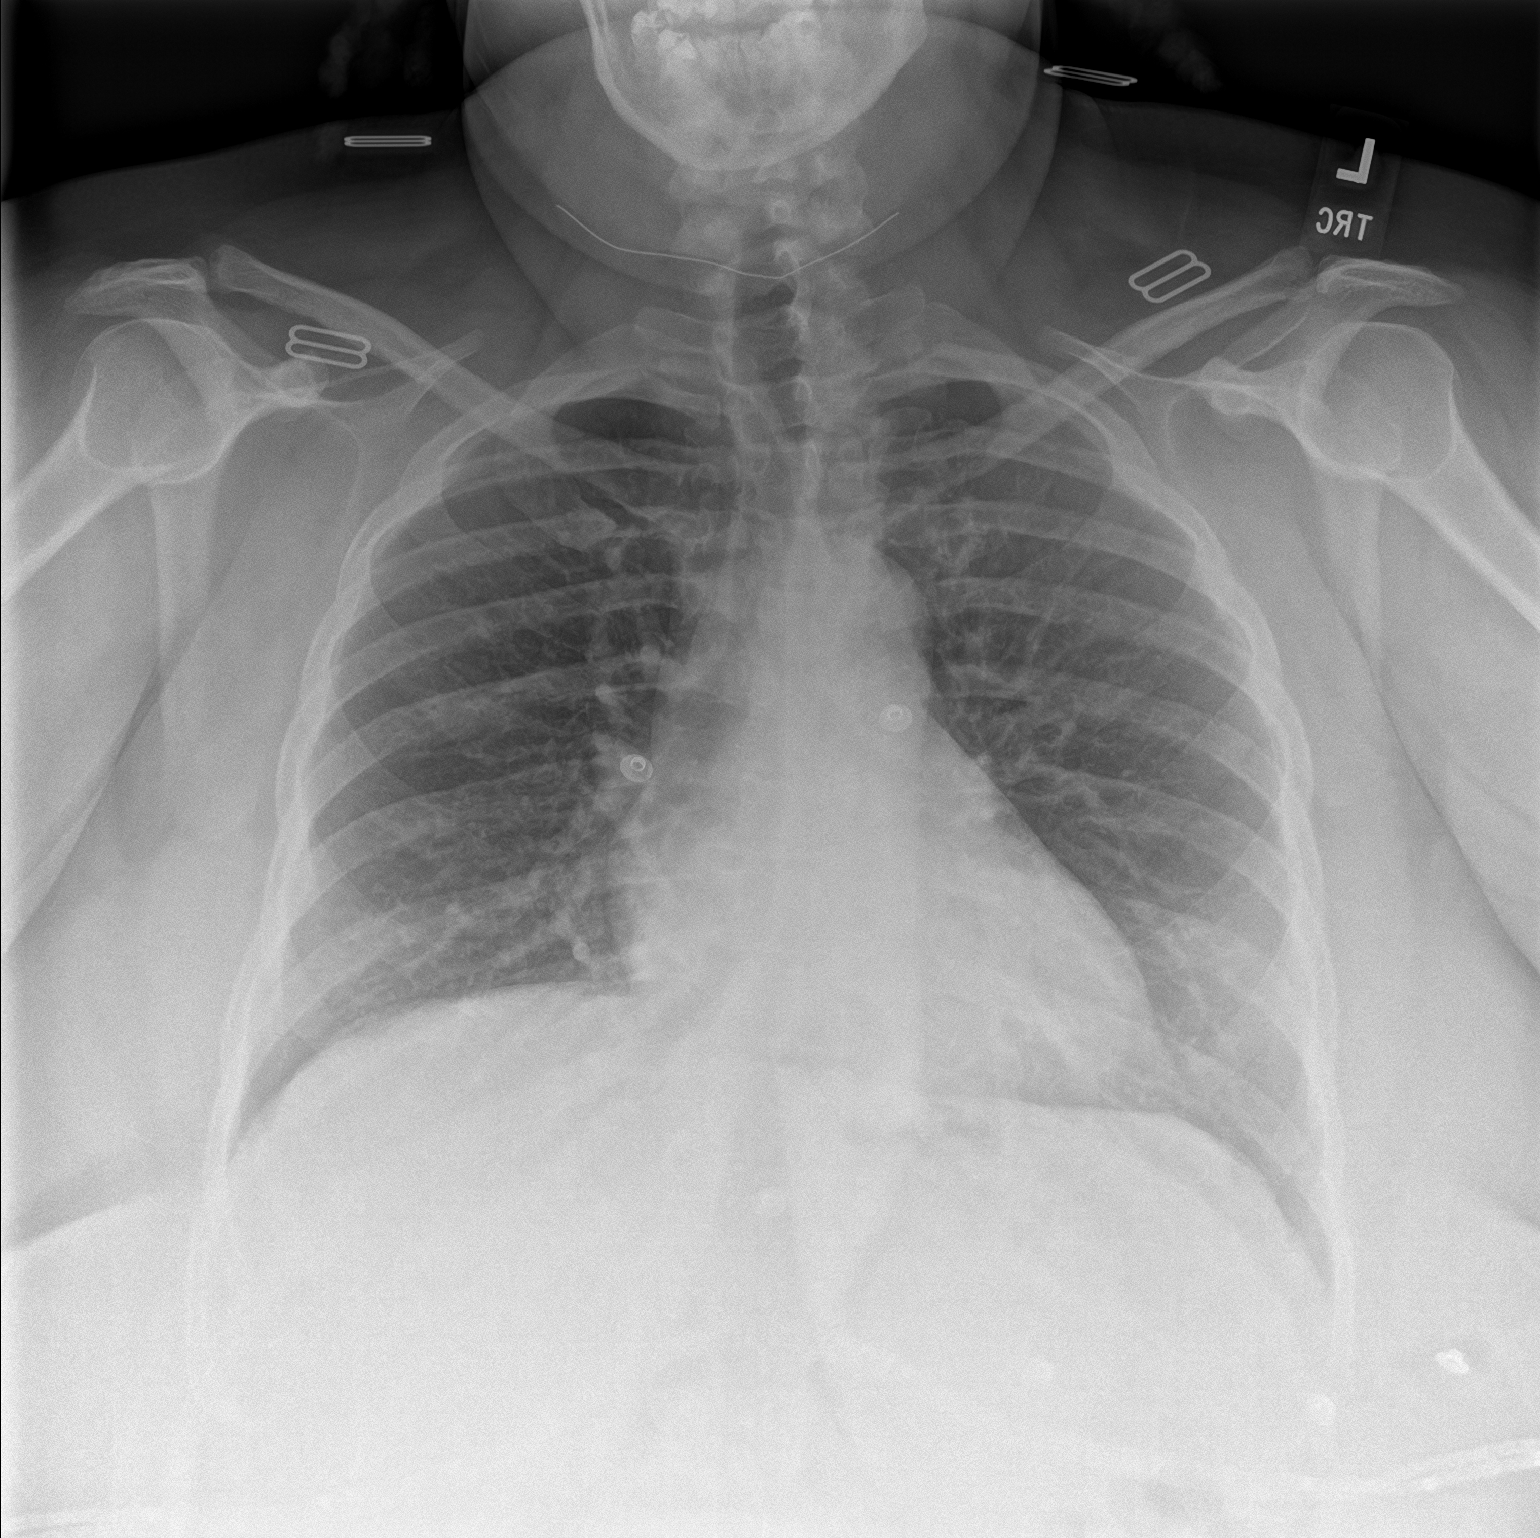
[im 2/2]
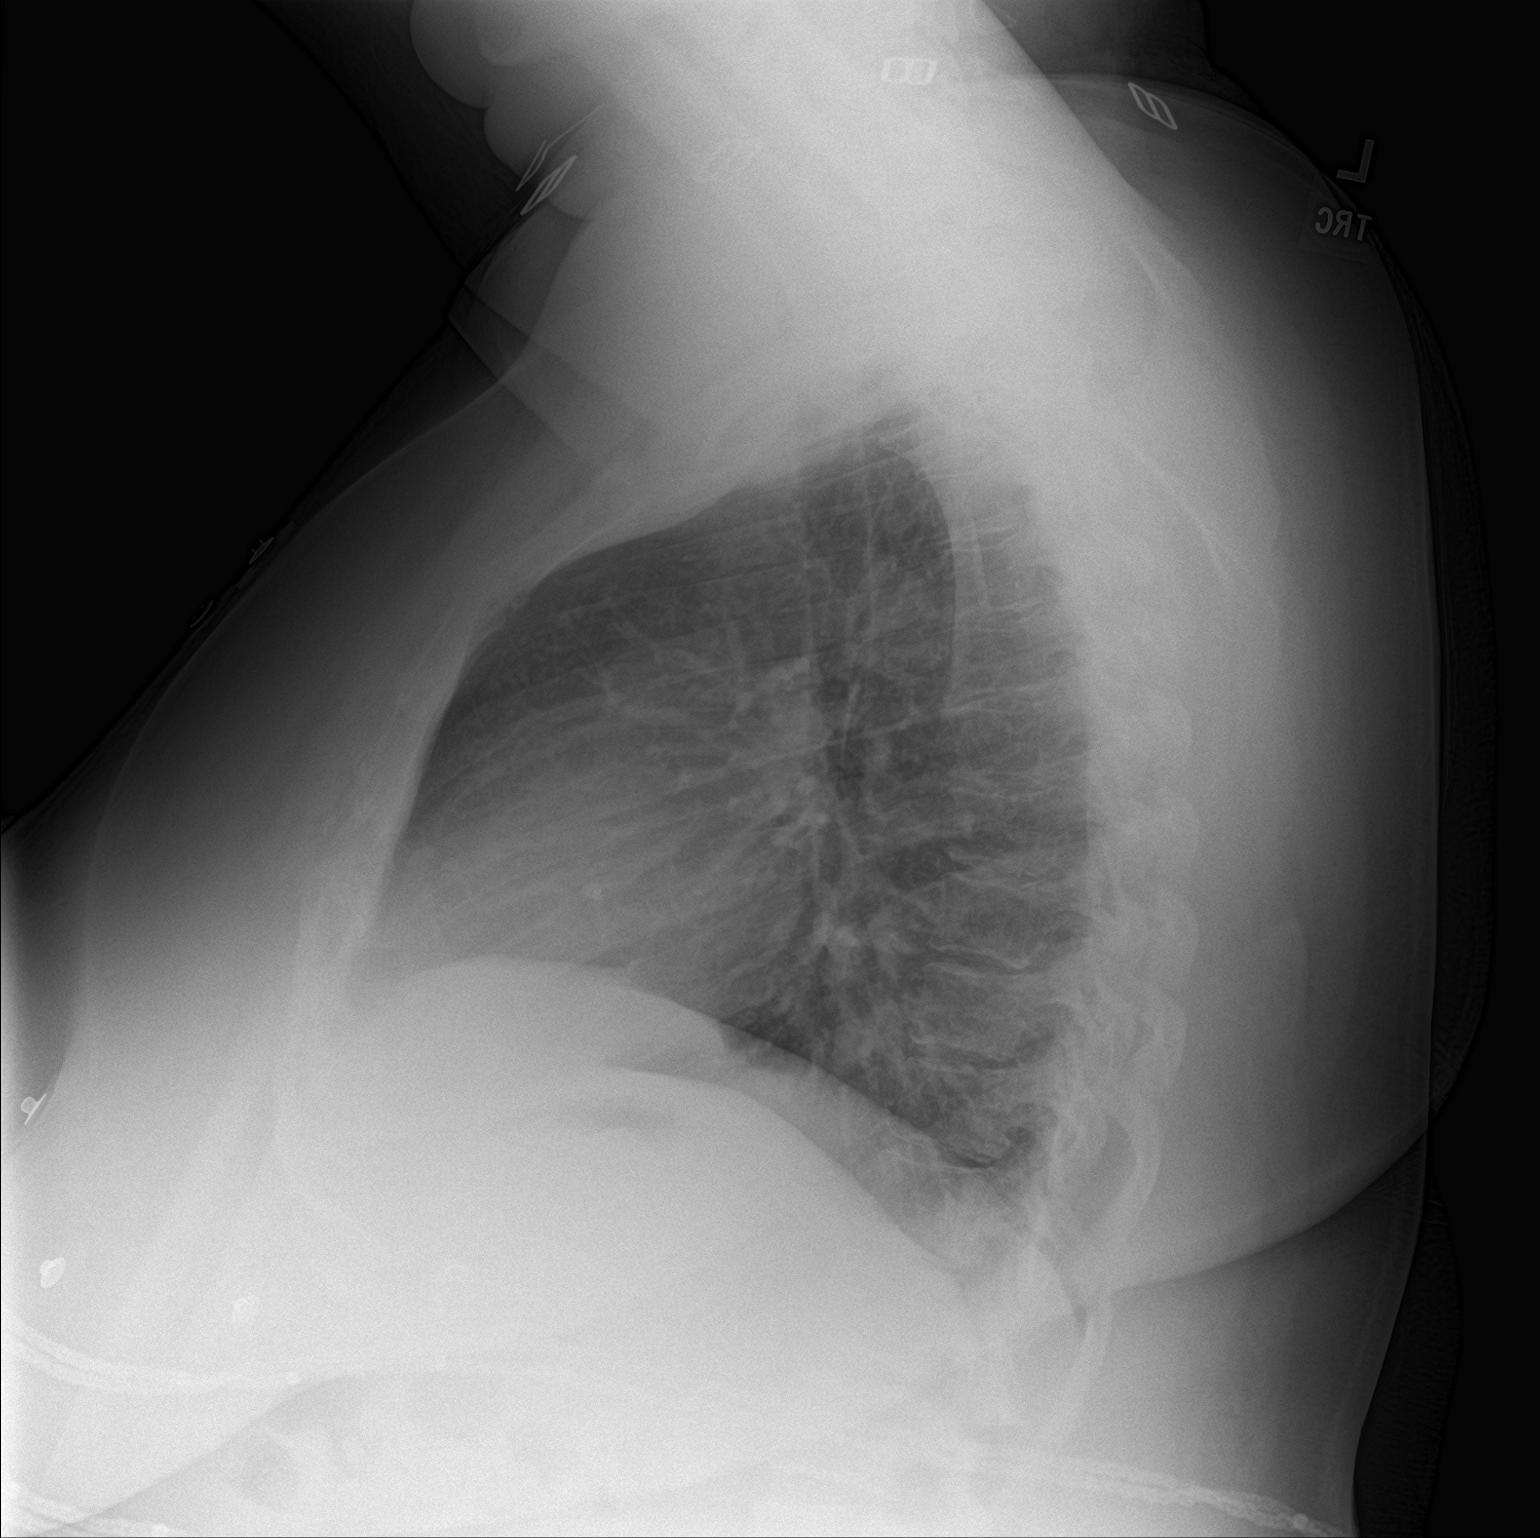

[2 of 2 positions shown; findings below may reference images not displayed]

FINDINGS: The heart size and mediastinal contours are within normal limits.
Both lungs are clear. The visualized skeletal structures are
unremarkable.
IMPRESSION: No active cardiopulmonary disease.

## 2021-04-22 ENCOUNTER — Encounter: Payer: Self-pay | Admitting: Internal Medicine

## 2021-04-22 ENCOUNTER — Ambulatory Visit (INDEPENDENT_AMBULATORY_CARE_PROVIDER_SITE_OTHER): Payer: Self-pay | Admitting: Internal Medicine

## 2021-04-22 ENCOUNTER — Other Ambulatory Visit: Payer: Self-pay

## 2021-04-22 VITALS — BP 110/80 | HR 72 | Ht 62.0 in | Wt 238.0 lb

## 2021-04-22 DIAGNOSIS — R079 Chest pain, unspecified: Secondary | ICD-10-CM

## 2021-04-22 DIAGNOSIS — I5032 Chronic diastolic (congestive) heart failure: Secondary | ICD-10-CM | POA: Insufficient documentation

## 2021-04-22 DIAGNOSIS — R0602 Shortness of breath: Secondary | ICD-10-CM | POA: Insufficient documentation

## 2021-04-22 MED ORDER — METOPROLOL SUCCINATE ER 25 MG PO TB24: 25.0000 mg | ORAL_TABLET | Freq: Every day | ORAL | 1 refills | Status: DC

## 2021-04-22 NOTE — Progress Notes (Signed)
New Outpatient Visit Date: 04/22/2021  Primary Care provider: [facility] [address] [place],  [place] [id number]  Chief Complaint: Chest pain and shortness of breath  HPI:  Sherry Watkins is a 40 y.o. female who is being seen today for the evaluation of chronic heart failure with preserved ejection fraction. She has a history of hypertension, GERD, frequent headaches, endometriosis, ovarian cyst, and morbid obesity.  She has presented to the [facility] emergency department 6 times in the last 6 months complaining of chest pain and or heart failure symptoms.  She was recently evaluated by Darylene Price, NP, in the heart failure clinic, at which time losartan was increased to 100 mg daily.  Amlodipine was held to see if chronic leg edema would improve.  Echocardiogram last month showed LVEF of 60-65% with grade 1 diastolic dysfunction and normal global longitudinal strain.  RV size and function were normal.  No significant valvular abnormality was observed.  Today, [name] reports that for the last 2 months, she has not felt well.  She reports constant pressure in her chest on the left side.  It waxes and wanes in intensity but never completely goes away.  There are no exacerbating or alleviating factors.  She also has exertional dyspnea present for the last few months accompanied by lower extremity edema.  The swelling has gotten a bit better with discontinuation of amlodipine.  She denies palpitations and lightheadedness.  She frequently feels tired throughout the day and often naps.  She has never been evaluated for sleep apnea.  She has been told in the past that she snores but has not heard of any apneic episodes.  She previously walked on the track but has not been doing this recently.  The last time she tried it about 2 weeks ago, she quickly got out of  breath.  --------------------------------------------------------------------------------------------------  Cardiovascular History & Procedures: Cardiovascular Problems:  Chest pain  HFpEF  Risk Factors:  Hypertension, prior tobacco use, and obesity  Cath/PCI:  None  CV Surgery:  None  EP Procedures and Devices:  None  Non-Invasive Evaluation(s):  TTE (04/16/2021): Normal LV size and wall thickness with LVEF 60-65%.  Grade 1 diastolic dysfunction with normal GLS (-17.9%).  Normal RV size and function.  Normal biatrial size.  No significant valvular abnormality.  Normal CVP.  (On personal review, diastolic function is likely normal).  Recent CV Pertinent Labs: Lab Results  Component Value Date   BNP 8.6 03/30/2021   K 3.6 04/09/2021   K 3.6 01/01/2014   MG 1.8 12/26/2017   BUN 7 04/09/2021   BUN 6 12/26/2017   BUN 8 01/01/2014   CREATININE 0.75 04/09/2021   CREATININE 0.95 01/01/2014    --------------------------------------------------------------------------------------------------  Past Medical History:  Diagnosis Date  . Complication of anesthesia   . Depression   . Endometriosis   . GERD (gastroesophageal reflux disease)    NO MEDS  . Headache    FREQUENTLY  . Hypertension   . Obesity   . Ovarian cyst     Past Surgical History:  Procedure Laterality Date  . ABLATION    . CESAREAN SECTION    . CYSTOSCOPY N/A 08/24/2018   Procedure: CYSTOSCOPY;  Surgeon: Will Bonnet, MD;  Location: [facility];  Service: Gynecology;  Laterality: N/A;  . HYSTEROSCOPY WITH NOVASURE N/A 05/27/2015   Procedure: HYSTEROSCOPY WITH NOVASURE;  Surgeon: Aletha Halim, MD;  Location: [facility];  Service: Gynecology;  Laterality: N/A;  .  LAPAROSCOPY N/A 07/07/2017   Procedure: LAPAROSCOPY DIAGNOSTIC;  Surgeon: Malachy Mood, MD;  Location: [facility];  Service: Gynecology;  Laterality: N/A;  . PARTIAL HYSTERECTOMY    . ROBOTIC ASSISTED TOTAL HYSTERECTOMY Bilateral  08/24/2018   Procedure: ROBOTIC ASSISTED TOTAL HYSTERECTOMY BILATERAL SALPINGECTOMY;  Surgeon: Will Bonnet, MD;  Location: [facility];  Service: Gynecology;  Laterality: Bilateral;  . TUBAL LIGATION      Current Meds  Medication Sig  . acetaminophen (TYLENOL) 500 MG tablet Take 500 mg by mouth every 6 (six) hours as needed.  . famotidine (PEPCID) 20 MG tablet Take 20 mg by mouth daily.  . furosemide (LASIX) 20 MG tablet Take 1 tablet (20 mg total) by mouth daily.  Marland Kitchen losartan (COZAAR) 100 MG tablet Take 100 mg by mouth daily.  . Melatonin 10 MG CAPS Take 20 mg by mouth at bedtime.  . pantoprazole (PROTONIX) 40 MG tablet Take 1 tablet (40 mg total) by mouth daily.  . potassium chloride (KLOR-CON) 10 MEQ tablet Take 10 mEq by mouth 2 (two) times daily.    Allergies: Patient has no known allergies.  Social History   Tobacco Use  . Smoking status: Former Smoker    Years: 15.00    Types: Cigarettes    Quit date: 06/30/2004    Years since quitting: 16.8  . Smokeless tobacco: Never Used  Vaping Use  . Vaping Use: Never used  Substance Use Topics  . Alcohol use: Yes    Alcohol/week: 4.0 standard drinks    Types: 4 Glasses of wine per week  . Drug use: No    Family History  Problem Relation Age of Onset  . Diabetes Mother   . Hypertension Mother   . Kidney failure Mother   . Cancer Father   . Heart attack Father   . Heart failure Father   . Heart disease Brother     Review of Systems: A 12-system review of systems was performed and was negative except as noted in the HPI.  --------------------------------------------------------------------------------------------------  Physical Exam: BP 110/80 (BP Location: Right Arm, Patient Position: Sitting, Cuff Size: Large)   Pulse 72   Ht 5\' 2"  (1.575 m)   Wt 238 lb (108 kg)   LMP 05/13/2015 (Approximate)   SpO2 98%   BMI 43.53 kg/m   General: NAD. HEENT: No conjunctival pallor or scleral icterus. Facemask in  place. Neck: Supple without lymphadenopathy, thyromegaly, JVD, or HJR. No carotid bruit. Lungs: Normal work of breathing. Clear to auscultation bilaterally without wheezes or crackles. Heart: Regular rate and rhythm without murmurs, rubs, or gallops.  Unable to assess PMI due to body habitus. Abd: Bowel sounds present. Soft, NT/ND without hepatosplenomegaly Ext: No lower extremity edema. Radial, PT, and DP pulses are 2+ bilaterally Skin: Warm and dry without rash. Neuro: CNIII-XII intact. Strength and fine-touch sensation intact in upper and lower extremities bilaterally. Psych: Normal mood and affect.  EKG: Normal sinus rhythm without abnormality.  Lab Results  Component Value Date   WBC 7.5 04/06/2021   HGB 13.1 04/06/2021   HCT 40.6 04/06/2021   MCV 82.9 04/06/2021   PLT 458 (H) 04/06/2021    Lab Results  Component Value Date   NA 138 04/09/2021   K 3.6 04/09/2021   CL 104 04/09/2021   CO2 25 04/09/2021   BUN 7 04/09/2021   CREATININE 0.75 04/09/2021   GLUCOSE 96 04/09/2021   ALT 52 (H) 03/30/2021    No results found for: CHOL, HDL, LDLCALC, LDLDIRECT,  TRIG, CHOLHDL   --------------------------------------------------------------------------------------------------  ASSESSMENT AND PLAN: Chest pain, shortness of breath, and chronic HFpEF: Patient reports at least 2 months of symptoms, though review of her chart indicates that she has been seeking evaluation in the emergency department since at least 06/2019 for chest pain and/or shortness of breath.  Her examination today is unrevealing.  EKG is also normal.  I have personally reviewed her recent echocardiogram which shows normal LVEF and borderline diastolic dysfunction.  Global longitudinal strain is also normal.  She is most concerned about chest tightness that his constant and not associated with particular activity.  Risk factors include hypertension, prior tobacco use, and morbid obesity.  We discussed the role for  ischemia evaluation and have agreed to proceed with an exercise tolerance test.  We have also agreed to add metoprolol succinate 25 mg daily.  Shared Decision Making/Informed Consent The risks [chest pain, shortness of breath, cardiac arrhythmias, dizziness, blood pressure fluctuations, myocardial infarction, stroke/transient ischemic attack, and life-threatening complications (estimated to be 1 in 10,000)], benefits (risk stratification, diagnosing coronary artery disease, treatment guidance) and alternatives of an exercise tolerance test were discussed in detail with Ms. Frysinger and she agrees to proceed.  Follow-up: Return to clinic in 1 month.  Nelva Bush, MD 04/22/2021 10:37 AM

## 2021-04-22 NOTE — Patient Instructions (Signed)
Medication Instructions:  Your physician has recommended you make the following change in your medication:   START Metoprolol Succinate 25mg  DAILY  *If you need a refill on your cardiac medications before your next appointment, please call your pharmacy*   Lab Work:  None ordered  Testing/Procedures:  Your physician has requested that you have an exercise tolerance test. For further information please visit [email or web address]. Please also follow instruction sheet, as given.  Please follow these instructions for your Exercise Tolerance Test:  - you may eat a light breakfast/ lunch prior to your procedure - no caffeine for 24 hours prior to your test (coffee, tea, soft drinks, or chocolate)  - no smoking/ vaping for 4 hours prior to your test - you may take your regular medications the day of your test except for:     DO NOT take your Metoprolol Succinate TWO DAYS prior to your test  - bring any inhalers with you to your test - wear comfortable clothing & tennis/ non-skid shoes to walk on the treadmill   Follow-Up: At [facility], you and your health needs are our priority.  As part of our continuing mission to provide you with exceptional heart care, we have created designated Provider Care Teams.  These Care Teams include your primary Cardiologist (physician) and Advanced Practice Providers (APPs -  Physician Assistants and Nurse Practitioners) who all work together to provide you with the care you need, when you need it.  We recommend signing up for the patient portal called "MyChart".  Sign up information is provided on this After Visit Summary.  MyChart is used to connect with patients for Virtual Visits (Telemedicine).  Patients are able to view lab/test results, encounter notes, upcoming appointments, etc.  Non-urgent messages can be sent to your provider as well.   To learn more about what you can do with MyChart, go to [email or web address].    Your next appointment:    1 month(s)  The format for your next appointment:   In Person  Provider:   Nelva Bush, MD

## 2021-04-22 NOTE — Addendum Note (Signed)
Addended by: [place] Prout A on: 04/22/2021 01:28 PM   Modules accepted: Level of Service

## 2021-04-28 ENCOUNTER — Ambulatory Visit: Payer: Medicaid Other | Admitting: Family

## 2021-04-29 ENCOUNTER — Telehealth: Payer: Self-pay

## 2021-04-29 NOTE — Telephone Encounter (Signed)
Patient made aware of pre-test instructions for her scheduled gxt on 04/30/21 @ 10 am.  Patient confirmed appt and verbalized understanding. Patient has no questions or concerns at this time.  - you may eat a light breakfast/ lunch prior to your procedure - no caffeine for 24 hours prior to your test (coffee, tea, soft drinks, or chocolate)  - no smoking/ vaping for 4 hours prior to your test - you may take your regular medications the day of your test except for: DO NOT take Metoprolol or Lasix the morning of the test. They can be taken after. - bring any inhalers with you to your test - wear comfortable clothing & tennis/ non-skid shoes to walk on the treadmill

## 2021-04-30 ENCOUNTER — Other Ambulatory Visit: Payer: Self-pay

## 2021-04-30 ENCOUNTER — Ambulatory Visit (INDEPENDENT_AMBULATORY_CARE_PROVIDER_SITE_OTHER): Payer: Self-pay

## 2021-04-30 DIAGNOSIS — R079 Chest pain, unspecified: Secondary | ICD-10-CM

## 2021-05-01 ENCOUNTER — Telehealth: Payer: Self-pay | Admitting: *Deleted

## 2021-05-01 LAB — EXERCISE TOLERANCE TEST
Estimated workload: 7 METS
Exercise duration (min): 5 min
Exercise duration (sec): 59 s
MPHR: 178 {beats}/min
Peak HR: 155 {beats}/min
Percent HR: 87 %
RPE: 13
Rest HR: 91 {beats}/min

## 2021-05-01 NOTE — Telephone Encounter (Signed)
The patient has been notified of the result and verbalized understanding.  All questions (if any) were answered. Pt will follow up as scheduled 05/22/21.

## 2021-05-01 NOTE — Telephone Encounter (Signed)
Attempted to call pt to discuss stress test results and provider's recc.  No answer. Lmtcb.

## 2021-05-01 NOTE — Telephone Encounter (Signed)
-----   Message from Nelva Bush, MD sent at 05/01/2021 10:08 AM EDT ----- Please let Sherry Watkins know that her stress test does not show evidence of a blockage in her heart arteries.  Blood pressure was noted to be quite high during the stress test.  I recommend that she continue her current medications and follow-up as previously arranged to reassess her symptoms and blood pressure.

## 2021-05-17 ENCOUNTER — Other Ambulatory Visit: Payer: Self-pay | Admitting: Internal Medicine

## 2021-05-19 ENCOUNTER — Ambulatory Visit: Payer: Medicaid Other | Admitting: Family

## 2021-05-22 ENCOUNTER — Ambulatory Visit: Payer: Medicaid Other | Admitting: Physician Assistant

## 2021-05-26 NOTE — Progress Notes (Signed)
Patient ID: Sherry Watkins, female    DOB: Apr 01, 1981, 40 y.o.   MRN: [id number]   Sherry Watkins is a 40 y/o female with a history of HTN, depression, GERD and previous tobacco use.   Echo report from 04/16/21 reviewed and showed an EF of 60-65% without structural changes.   Was in the ED 04/08/21 but LWBS. Was in the ED 04/06/21 due to chest discomfort. Troponins were negative. Pain improved after toradol and she was also placed on protonix and was released. Was in the ED 03/30/21 due to peripheral edema. Ultrasound negative for DVT. Hypokalemia treated and she was released. Was in the ED 03/15/21 due to atypical chest pain where she was treated and released.   She presents today for a follow-up visit with a chief complaint of moderate shortness of breath with minimal exertion. She describes this as chronic in nature having been present for several months. She has associated fatigue, chest tightness (improving), pedal edema, light-headedness and chronic difficulty sleeping along with this. She denies any abdominal distention, palpitations, chest pain or cough.   Has recently moved and requests a new scale so she can start weighing herself again. Said that she was unable to complete her stress test because her BP started getting too high.   Past Medical History:  Diagnosis Date   Complication of anesthesia    Depression    Endometriosis    GERD (gastroesophageal reflux disease)    NO MEDS   Headache    FREQUENTLY   Hypertension    Obesity    Ovarian cyst    Past Surgical History:  Procedure Laterality Date   ABLATION     CESAREAN SECTION     CYSTOSCOPY N/A 08/24/2018   Procedure: CYSTOSCOPY;  Surgeon: Will Bonnet, MD;  Location: [facility];  Service: Gynecology;  Laterality: N/A;   HYSTEROSCOPY WITH NOVASURE N/A 05/27/2015   Procedure: HYSTEROSCOPY WITH NOVASURE;  Surgeon: Aletha Halim, MD;  Location: [facility];  Service: Gynecology;  Laterality: N/A;   LAPAROSCOPY N/A 07/07/2017    Procedure: LAPAROSCOPY DIAGNOSTIC;  Surgeon: Malachy Mood, MD;  Location: [facility];  Service: Gynecology;  Laterality: N/A;   PARTIAL HYSTERECTOMY     ROBOTIC ASSISTED TOTAL HYSTERECTOMY Bilateral 08/24/2018   Procedure: ROBOTIC ASSISTED TOTAL HYSTERECTOMY BILATERAL SALPINGECTOMY;  Surgeon: Will Bonnet, MD;  Location: [facility];  Service: Gynecology;  Laterality: Bilateral;   TUBAL LIGATION     Family History  Problem Relation Age of Onset   Diabetes Mother    Hypertension Mother    Kidney failure Mother    Cancer Father    Heart attack Father    Heart failure Father    Heart disease Brother    Social History   Tobacco Use   Smoking status: Former    Years: 15.00    Pack years: 0.00    Types: Cigarettes    Quit date: 06/30/2004    Years since quitting: 16.9   Smokeless tobacco: Never  Substance Use Topics   Alcohol use: Yes    Alcohol/week: 4.0 standard drinks    Types: 4 Glasses of wine per week   No Known Allergies  Prior to Admission medications   Medication Sig Start Date End Date Taking? Authorizing Provider  acetaminophen (TYLENOL) 500 MG tablet Take 500 mg by mouth every 6 (six) hours as needed.   Yes [provider]  famotidine (PEPCID) 20 MG tablet Take 20 mg by mouth daily.   Yes [provider]  furosemide (LASIX) 20 MG tablet Take 1 tablet (20 mg total) by mouth daily. 04/09/21 04/09/22 Yes Elfa Wooton, Otila Kluver A, FNP  losartan (COZAAR) 100 MG tablet Take 1 tablet (100 mg total) by mouth daily. 05/27/21  Yes Marshawn Normoyle A, FNP  Melatonin 10 MG CAPS Take 20 mg by mouth at bedtime.   Yes [provider]  metoprolol succinate (TOPROL-XL) 25 MG 24 hr tablet TAKE 1 TABLET (25 MG TOTAL) BY MOUTH DAILY. 05/18/21 07/17/21 Yes End, Harrell Gave, MD  potassium chloride (KLOR-CON) 10 MEQ tablet Take 10 mEq by mouth 2 (two) times daily.   Yes [provider]    Review of Systems  Constitutional:  Positive for fatigue. Negative for appetite  change.  HENT:  Negative for congestion, postnasal drip and sore throat.   Eyes: Negative.   Respiratory:  Positive for chest tightness (improving) and shortness of breath (with little exertion). Negative for cough.   Cardiovascular:  Positive for leg swelling. Negative for chest pain and palpitations.  Gastrointestinal:  Negative for abdominal distention and abdominal pain.  Endocrine: Negative.   Genitourinary: Negative.   Musculoskeletal: Negative.   Skin: Negative.   Allergic/Immunologic: Negative.   Neurological:  Positive for light-headedness (with heat). Negative for headaches.  Hematological:  Negative for adenopathy. Does not bruise/bleed easily.  Psychiatric/Behavioral:  Positive for sleep disturbance (sleeping better; sleeping on 3 pillows). Negative for dysphoric mood. The patient is not nervous/anxious.    Vitals:   05/27/21 1631  BP: 133/75  Pulse: 66  Resp: 18  SpO2: 99%  Weight: 245 lb 6 oz (111.3 kg)  Height: 5\' 2"  (1.575 m)   Wt Readings from Last 3 Encounters:  05/27/21 245 lb 6 oz (111.3 kg)  04/22/21 238 lb (108 kg)  04/09/21 235 lb 4 oz (106.7 kg)   Lab Results  Component Value Date   CREATININE 0.75 04/09/2021   CREATININE 0.71 04/06/2021   CREATININE 0.70 04/01/2021    Physical Exam Vitals and nursing note reviewed.  Constitutional:      Appearance: She is well-developed.  HENT:     Head: Normocephalic and atraumatic.  Neck:     Vascular: No JVD.  Cardiovascular:     Rate and Rhythm: Normal rate and regular rhythm.  Pulmonary:     Effort: Pulmonary effort is normal. No respiratory distress.     Breath sounds: No wheezing or rales.  Abdominal:     Palpations: Abdomen is soft.     Tenderness: There is no abdominal tenderness.  Musculoskeletal:        General: No tenderness.     Cervical back: Neck supple.     Right lower leg: No tenderness. Edema (trace pitting) present.     Left lower leg: No tenderness. Edema (trace pitting) present.   Skin:    General: Skin is warm and dry.  Neurological:     General: No focal deficit present.     Mental Status: She is alert and oriented to person, place, and time.  Psychiatric:        Mood and Affect: Mood is not anxious.        Behavior: Behavior normal.   Assessment & Plan:  1: Heart failure with preserved ejection fraction without structural changes- - NYHA class III - euvolemic today - not weighing daily as she needs scales; scales given to her today and she was reminded to call for an overnight weight gain of > 2 pounds or a weekly weight gain of > 5  pounds - weight up 10 pounds from last visit here 6 weeks ago - saw cardiology (End) on 04/22/21 - not adding salt to her food but hasn't been reading food labels too closely; reviewed the importance of reading food labels to keep her daily sodium intake to 2000mg / day.  - reports drinking 48 ounces of water daily - BNP 03/30/21 was 8.6  2: HTN- - BP looks good today - sees PCP at [facility] 04/09/21 reviewed and showed sodium 138, potassium 3.6, creatinine 0.75 and GFR >60   Medication bottles reviewed.   Due to HF stability, will not make a return appointment at this time. Advised patient that she could call back at anytime for any questions or to schedule another appointment and she was comfortable with this plan.

## 2021-05-27 ENCOUNTER — Ambulatory Visit: Payer: Medicaid Other | Attending: Family | Admitting: Family

## 2021-05-27 ENCOUNTER — Other Ambulatory Visit: Payer: Self-pay

## 2021-05-27 ENCOUNTER — Encounter: Payer: Self-pay | Admitting: Family

## 2021-05-27 VITALS — BP 133/75 | HR 66 | Resp 18 | Ht 62.0 in | Wt 245.4 lb

## 2021-05-27 DIAGNOSIS — I5032 Chronic diastolic (congestive) heart failure: Secondary | ICD-10-CM

## 2021-05-27 DIAGNOSIS — I110 Hypertensive heart disease with heart failure: Secondary | ICD-10-CM | POA: Insufficient documentation

## 2021-05-27 DIAGNOSIS — I5030 Unspecified diastolic (congestive) heart failure: Secondary | ICD-10-CM | POA: Insufficient documentation

## 2021-05-27 DIAGNOSIS — Z8249 Family history of ischemic heart disease and other diseases of the circulatory system: Secondary | ICD-10-CM | POA: Insufficient documentation

## 2021-05-27 DIAGNOSIS — I10 Essential (primary) hypertension: Secondary | ICD-10-CM

## 2021-05-27 DIAGNOSIS — K219 Gastro-esophageal reflux disease without esophagitis: Secondary | ICD-10-CM | POA: Insufficient documentation

## 2021-05-27 DIAGNOSIS — Z87891 Personal history of nicotine dependence: Secondary | ICD-10-CM | POA: Insufficient documentation

## 2021-05-27 DIAGNOSIS — Z79899 Other long term (current) drug therapy: Secondary | ICD-10-CM | POA: Insufficient documentation

## 2021-05-27 MED ORDER — LOSARTAN POTASSIUM 100 MG PO TABS: 100.0000 mg | ORAL_TABLET | Freq: Every day | ORAL | 5 refills | Status: DC

## 2021-05-27 NOTE — Patient Instructions (Addendum)
Continue weighing daily and call for an overnight weight gain of > 2 pounds or a weekly weight gain of >5 pounds.    Call us in the future for any questions or if you need to schedule an appointment

## 2021-06-01 ENCOUNTER — Other Ambulatory Visit: Payer: Self-pay | Admitting: Internal Medicine

## 2021-06-16 ENCOUNTER — Encounter: Payer: Self-pay | Admitting: Family

## 2021-08-02 NOTE — Progress Notes (Signed)
Office Visit    Patient Name: Sherry Watkins Date of Encounter: 08/03/2021  PCP:  [facility]  Cardiologist:  Dr. Saunders Revel Advanced Practice Provider:  No care team member to display Electrophysiologist:  None [phone]   Chief Complaint    Chief Complaint  Patient presents with   Other    1 month f/u c/o chest pain x2 days. Meds reviewed verbally with pt.    40 y.o. female with history of HFpEF, hypertension, GERD, frequent headaches, endometriosis, ovarian cyst, and morbid obesity, and here today for 1 month follow-up with report of chest pain x2 days.  Past Medical History    Past Medical History:  Diagnosis Date   Complication of anesthesia    Depression    Endometriosis    GERD (gastroesophageal reflux disease)    NO MEDS   Headache    FREQUENTLY   Hypertension    Obesity    Ovarian cyst    Past Surgical History:  Procedure Laterality Date   ABLATION     CESAREAN SECTION     CYSTOSCOPY N/A 08/24/2018   Procedure: CYSTOSCOPY;  Surgeon: Will Bonnet, MD;  Location: [facility];  Service: Gynecology;  Laterality: N/A;   HYSTEROSCOPY WITH NOVASURE N/A 05/27/2015   Procedure: HYSTEROSCOPY WITH NOVASURE;  Surgeon: Aletha Halim, MD;  Location: [facility];  Service: Gynecology;  Laterality: N/A;   LAPAROSCOPY N/A 07/07/2017   Procedure: LAPAROSCOPY DIAGNOSTIC;  Surgeon: Malachy Mood, MD;  Location: [facility];  Service: Gynecology;  Laterality: N/A;   PARTIAL HYSTERECTOMY     ROBOTIC ASSISTED TOTAL HYSTERECTOMY Bilateral 08/24/2018   Procedure: ROBOTIC ASSISTED TOTAL HYSTERECTOMY BILATERAL SALPINGECTOMY;  Surgeon: Will Bonnet, MD;  Location: [facility];  Service: Gynecology;  Laterality: Bilateral;   TUBAL LIGATION      Allergies  No Known Allergies  History of Present Illness    Sherry Watkins is a 40 y.o. female with PMH as above.  She has history of chronic heart failure with preserved  ejection fraction, hypertension, GERD, frequent headaches, endometriosis, ovarian cysts, and morbid obesity.  At her previous clinic visit 04/22/2021, it was noted she presented to [facility] emergency department 6 times in the last 6 months with report of chest pain and/or heart failure symptoms.  She had recently been evaluated by Darylene Price, NP, in the heart failure clinic.  Losartan was increased to 100 mg daily.  Amlodipine was held to see if improvement in leg edema.  03/2021 echo showed LVEF 60 to 65%, G1 DD, normal GLS.  No significant valvular abnormality.  She was seen 04/22/2021 and reported not feeling well over the last 2 months.  She reported constant chest pressure on her left side.  This would come and go but never completely dissipate.  No clear exacerbating or alleviating factors.  She also reported exertional dyspnea over the last few months and lower extremity edema.  With holding amlodipine, the swelling had improved slightly.  She frequently felt tired throughout the day and often napped.  She had never been evaluated for sleep study/sleep apnea.  She had been told she snores in the past but not heard that she has apneic episodes.  She had previously walked on the track but had stopped walking lately.  She reported the last time she attempted walking was 2 weeks earlier and got out of breath.  Given her ongoing symptoms, recommendation was for exercise tolerance test.  Metoprolol  succinate 25 mg daily was also added for antianginal effect.  Subsequent ETT showed a hypertensive BP response to exercise but no significant ST/T changes or arrhythmia during stress or recovery.  It was overall ruled a low risk exercise tolerance test without evidence of ischemia.    Today, 08/03/2021, she returns to clinic and notes ongoing symptoms of chest pain.  She reports current chest pain at the time of the visit with BP significantly elevated at 140/100.  She has not taken any of her medications yet today as she ran  out of her medications.  She reports that when on medications, SBP 130s.  She has ongoing fatigue, LAE, and dyspnea.  Recent studies reviewed as below.    Home Medications   Current Outpatient Medications  Medication Instructions   acetaminophen (TYLENOL) 500 mg, Oral, Every 6 hours PRN   famotidine (PEPCID) 20 mg, Daily   furosemide (LASIX) 20 mg, Oral, Daily   losartan (COZAAR) 100 mg, Oral, Daily   Melatonin 20 mg, Oral, Daily at bedtime   metoprolol succinate (TOPROL-XL) 25 mg, Oral, Daily   potassium chloride (KLOR-CON) 10 MEQ tablet 10 mEq, Oral, 2 times daily     Review of Systems    She reports chest pain as above.  She reports fatigue and LEE. She denies palpitations, dyspnea, pnd, orthopnea, n, v, dizziness, syncope, weight gain, or early satiety.    All other systems reviewed and are otherwise negative except as noted above.  Physical Exam    VS:  BP (!) 140/100 (BP Location: Left Arm, Patient Position: Sitting, Cuff Size: Large)   Pulse 70   Ht '5\' 2"'$  (1.575 m)   Wt 253 lb 4 oz (114.9 kg)   LMP 05/13/2015 (Approximate)   SpO2 97%   BMI 46.32 kg/m  , BMI Body mass index is 46.32 kg/m. GEN: Well nourished, well developed, in no acute distress. HEENT: normal. Neck: Supple, no JVD, carotid bruits, or masses. Cardiac: RRR, no murmurs, rubs, or gallops. No clubbing, cyanosis, edema.  Radials/DP/PT 2+ and equal bilaterally.  Respiratory:  Respirations regular and unlabored, clear to auscultation bilaterally. GI: Soft, nontender, nondistended, BS + x 4. MS: no deformity or atrophy. Skin: warm and dry, no rash. Neuro:  Strength and sensation are intact. Psych: Normal affect.  Accessory Clinical Findings    ECG personally reviewed by me today -NSR, T WI in lead III, QRS 78 ms, 70 bpm- no acute changes.  VITALS Reviewed today   Temp Readings from Last 3 Encounters:  04/07/21 97.8 F (36.6 C) (Oral)  04/06/21 98.2 F (36.8 C) (Oral)  03/30/21 98 F (36.7 C) (Oral)    BP Readings from Last 3 Encounters:  08/03/21 (!) 140/100  05/27/21 133/75  04/22/21 110/80   Pulse Readings from Last 3 Encounters:  08/03/21 70  05/27/21 66  04/22/21 72    Wt Readings from Last 3 Encounters:  08/03/21 253 lb 4 oz (114.9 kg)  05/27/21 245 lb 6 oz (111.3 kg)  04/22/21 238 lb (108 kg)     LABS  reviewed today   Lab Results  Component Value Date   WBC 7.5 04/06/2021   HGB 13.1 04/06/2021   HCT 40.6 04/06/2021   MCV 82.9 04/06/2021   PLT 458 (H) 04/06/2021   Lab Results  Component Value Date   CREATININE 0.75 04/09/2021   BUN 7 04/09/2021   NA 138 04/09/2021   K 3.6 04/09/2021   CL 104 04/09/2021   CO2 25 04/09/2021  Lab Results  Component Value Date   ALT 52 (H) 03/30/2021   AST 77 (H) 03/30/2021   ALKPHOS 56 03/30/2021   BILITOT 0.6 03/30/2021   No results found for: CHOL, HDL, LDLCALC, LDLDIRECT, TRIG, CHOLHDL  No results found for: HGBA1C No results found for: TSH   STUDIES/PROCEDURES reviewed today   ETT 04/30/2021 Baseline EKG demonstrates normal sinus rhythm. The patient exhibited fair exercise capacity with a hypertensive blood pressure response. No angina was reported. No significant ST segment or T wave changes were observed during stress or recovery. No significant arrhythmia occurred. Low risk exercise tolerance test (Duke Treadmill Score = +6). Low risk exercise tolerance test without evidence of ischemia at workload achieved.  Hypertensive blood pressure response noted  Echo 04/16/2021  1. Left ventricular ejection fraction, by estimation, is 60 to 65%. The  left ventricle has normal function. The left ventricle has no regional  wall motion abnormalities. Left ventricular diastolic parameters are  consistent with Grade I diastolic  dysfunction (impaired relaxation). The average left ventricular global  longitudinal strain is -17.9 %. The global longitudinal strain is normal.   2. Right ventricular systolic function is  normal. The right ventricular  size is normal. Tricuspid regurgitation signal is inadequate for assessing  PA pressure.   Assessment & Plan    Chest pain, atypical, chronic --Reports ongoing chest pain, including chest pain at the time of the visit.   She has not taken any medications today with BP significantly elevated.  In addition, she is volume up on exam.  We discussed that elevated BP and volume overload could certainly be contributing to her discomfort.  Metoprolol administered in clinic with recommendation that she fill her medications and take them soon as possible.  Also discussed was possible symptoms due to GERD with recommendation to continue famotidine.  Continue to monitor BP/heart rate at home.  Studies reviewed with the patient reassurance that her exercise tolerance test was overall low risk.  Recommend lifestyle changes including regular exercise and heart healthy diet.  Reviewed recommendations for salt and fluid restrictions.  In addition, recommended she always ensure prescriptions are called in with enough time to fill them to reduce elevated BP / volume overload episodes in the future. Of note, she reports a history of headache with nitro thus will avoid Imdur.  With ongoing chest pain, consider increased dose metoprolol at RTC.  Acute on chronic HFpEF --Volume up on exam with elevated BP after missing her medications, which is likely contributing to her symptoms.  Previous echo as above with EF 60 to 65%, NR WMA.  Restart current medications.  Given her missed doses of Lasix, we will increase to Lasix 40 mg daily x3 days and potassium 40 M EQ x3 days and then drop down to her previous doses.  Salt and fluid restrictions reviewed.  Continue metoprolol and losartan.  Recommended she monitor weight and BP at home and bring this log into the office at her next visit.  Essential hypertension --BP very elevated at 140/100 in the setting of missing her medications.  Restart  medications as above.  Given she has been off of her Lasix, recommend Lasix 40 mg daily x3 days then reduce back down to Lasix 20 mg daily.  During the 3 days of increased Lasix, she should take potassium 40 M EQ and then reduce to potassium 20 M EQ daily thereafter.  She should continue her metoprolol and losartan.  Strong recommendation that she call the office if  she ever needs refills for these medications in the future with enough time for [place] to get them called in and avoid elevated BP in the future.  Salt and fluid restrictions reviewed.  Lifestyle changes/increased activity recommended.  GERD --Recommend restart famotidine.  If famotidine does not adequately control GI symptoms, Protonix could be considered in the future.  Will defer to PCP.  Medication changes:  --Restart medications.   --Increase to Lasix 40 mg daily and potassium 40 M EQ x3 days only. Labs ordered:  --BMET in 1 week Studies / Imaging ordered:  --None Disposition:  --RTC 1 week to recheck BP/volume status  *Please be aware that the above documentation was completed voice recognition software and may contain dictation errors.    Arvil Chaco, PA-C 08/03/2021

## 2021-08-03 ENCOUNTER — Other Ambulatory Visit: Payer: Self-pay

## 2021-08-03 ENCOUNTER — Encounter: Payer: Self-pay | Admitting: Physician Assistant

## 2021-08-03 ENCOUNTER — Ambulatory Visit (INDEPENDENT_AMBULATORY_CARE_PROVIDER_SITE_OTHER): Payer: Self-pay | Admitting: Physician Assistant

## 2021-08-03 VITALS — BP 140/100 | HR 70 | Ht 62.0 in | Wt 253.2 lb

## 2021-08-03 DIAGNOSIS — I5033 Acute on chronic diastolic (congestive) heart failure: Secondary | ICD-10-CM

## 2021-08-03 DIAGNOSIS — G8929 Other chronic pain: Secondary | ICD-10-CM

## 2021-08-03 DIAGNOSIS — I10 Essential (primary) hypertension: Secondary | ICD-10-CM

## 2021-08-03 DIAGNOSIS — R079 Chest pain, unspecified: Secondary | ICD-10-CM

## 2021-08-03 DIAGNOSIS — I5032 Chronic diastolic (congestive) heart failure: Secondary | ICD-10-CM

## 2021-08-03 DIAGNOSIS — K219 Gastro-esophageal reflux disease without esophagitis: Secondary | ICD-10-CM

## 2021-08-03 MED ORDER — METOPROLOL SUCCINATE ER 25 MG PO TB24
25.0000 mg | ORAL_TABLET | Freq: Every day | ORAL | 1 refills | Status: DC
Start: 2021-08-03 — End: 2022-03-08

## 2021-08-03 MED ORDER — LOSARTAN POTASSIUM 100 MG PO TABS: 100.0000 mg | ORAL_TABLET | Freq: Every day | ORAL | 1 refills | Status: DC

## 2021-08-03 NOTE — Patient Instructions (Signed)
Medication Instructions:  Your physician has recommended you make the following change in your medication:   INCREASE Furosemide (Lasix) to 40 mg daily for 3 days. Then reduce to 20 mg daily.  INCREASE Potassium 40 meq daily for 3 days. Then reduce to 20 meq daily.  Metoprolol and Losartan has been refilled today    *If you need a refill on your cardiac medications before your next appointment, please call your pharmacy*   Lab Work: Your physician recommends that you return for lab work (Bmp) in: 1 week   If you have labs (blood work) drawn today and your tests are completely normal, you will receive your results only by: [place] (if you have MyChart) OR A paper copy in the mail If you have any lab test that is abnormal or we need to change your treatment, we will call you to review the results.   Testing/Procedures: None ordered   Follow-Up: At [facility], you and your health needs are our priority.  As part of our continuing mission to provide you with exceptional heart care, we have created designated Provider Care Teams.  These Care Teams include your primary Cardiologist (physician) and Advanced Practice Providers (APPs -  Physician Assistants and Nurse Practitioners) who all work together to provide you with the care you need, when you need it.  We recommend signing up for the patient portal called "MyChart".  Sign up information is provided on this After Visit Summary.  MyChart is used to connect with patients for Virtual Visits (Telemedicine).  Patients are able to view lab/test results, encounter notes, upcoming appointments, etc.  Non-urgent messages can be sent to your provider as well.   To learn more about what you can do with MyChart, go to [email or web address].    Your next appointment:   4 week(s)  The format for your next appointment:   In Person  Provider:   You may see Dr. Saunders Revel or one of the following Advanced Practice Providers on your  designated Care Team:   Murray Hodgkins, NP Christell Faith, PA-C Marrianne Mood, PA-C Cadence Kathlen Mody, [place]   Other Instructions N/A

## 2021-08-04 ENCOUNTER — Encounter: Payer: Self-pay | Admitting: Physician Assistant

## 2021-08-12 ENCOUNTER — Other Ambulatory Visit: Payer: Self-pay

## 2021-08-12 ENCOUNTER — Other Ambulatory Visit (INDEPENDENT_AMBULATORY_CARE_PROVIDER_SITE_OTHER): Payer: Self-pay

## 2021-08-12 DIAGNOSIS — I5033 Acute on chronic diastolic (congestive) heart failure: Secondary | ICD-10-CM

## 2021-08-13 LAB — BASIC METABOLIC PANEL
BUN/Creatinine Ratio: 8 — ABNORMAL LOW (ref 9–23)
BUN: 7 mg/dL (ref 6–24)
CO2: 19 mmol/L — ABNORMAL LOW (ref 20–29)
Calcium: 10.1 mg/dL (ref 8.7–10.2)
Chloride: 101 mmol/L (ref 96–106)
Creatinine, Ser: 0.92 mg/dL (ref 0.57–1.00)
Glucose: 90 mg/dL (ref 65–99)
Potassium: 4.2 mmol/L (ref 3.5–5.2)
Sodium: 139 mmol/L (ref 134–144)
eGFR: 80 mL/min/{1.73_m2} (ref >59)

## 2021-08-20 ENCOUNTER — Telehealth: Payer: Self-pay | Admitting: Internal Medicine

## 2021-08-20 NOTE — Telephone Encounter (Signed)
Attempted to call the patient. No answer- I left a message to please call back.  

## 2021-08-20 NOTE — Telephone Encounter (Signed)
Patient returning call for results 

## 2021-08-21 NOTE — Telephone Encounter (Signed)
The patient is aware of her lab results and voices understanding of these.  I inquired if she has ever completed a DPR in the office that we could leave her detailed messages and she advised she has not. I advised we need to have her complete one of these at her appointment on 08/28/21- added to appointment notes.  She is agreeable.

## 2021-08-28 ENCOUNTER — Ambulatory Visit: Payer: Self-pay | Admitting: Physician Assistant

## 2021-09-04 ENCOUNTER — Other Ambulatory Visit: Payer: Medicaid Other

## 2021-10-12 ENCOUNTER — Emergency Department: Payer: Medicaid Other

## 2021-10-12 ENCOUNTER — Encounter: Payer: Self-pay | Admitting: Emergency Medicine

## 2021-10-12 ENCOUNTER — Other Ambulatory Visit: Payer: Self-pay

## 2021-10-12 ENCOUNTER — Emergency Department
Admission: EM | Admit: 2021-10-12 | Discharge: 2021-10-12 | Disposition: A | Discharge status: Home / Self Care | Payer: Medicaid Other | Attending: Emergency Medicine | Admitting: Emergency Medicine

## 2021-10-12 DIAGNOSIS — Z79899 Other long term (current) drug therapy: Secondary | ICD-10-CM | POA: Insufficient documentation

## 2021-10-12 DIAGNOSIS — Z87891 Personal history of nicotine dependence: Secondary | ICD-10-CM | POA: Insufficient documentation

## 2021-10-12 DIAGNOSIS — B349 Viral infection, unspecified: Secondary | ICD-10-CM | POA: Insufficient documentation

## 2021-10-12 DIAGNOSIS — I10 Essential (primary) hypertension: Secondary | ICD-10-CM | POA: Insufficient documentation

## 2021-10-12 DIAGNOSIS — Z20822 Contact with and (suspected) exposure to covid-19: Secondary | ICD-10-CM | POA: Insufficient documentation

## 2021-10-12 LAB — RESP PANEL BY RT-PCR (FLU A&B, COVID) ARPGX2
Influenza A by PCR: NEGATIVE
Influenza B by PCR: NEGATIVE
SARS Coronavirus 2 by RT PCR: NEGATIVE

## 2021-10-12 MED ORDER — BENZONATATE 100 MG PO CAPS: 100.0000 mg | ORAL_CAPSULE | Freq: Three times a day (TID) | ORAL | 0 refills | Status: DC | PRN

## 2021-10-12 MED ORDER — PSEUDOEPH-BROMPHEN-DM 30-2-10 MG/5ML PO SYRP: 10.0000 mL | ORAL_SOLUTION | Freq: Four times a day (QID) | ORAL | 0 refills | Status: DC | PRN

## 2021-10-12 MED ORDER — FLUTICASONE PROPIONATE 50 MCG/ACT NA SUSP: 1.0000 | Freq: Two times a day (BID) | NASAL | 0 refills | Status: DC

## 2021-10-12 NOTE — ED Provider Notes (Signed)
Emergency Medicine Provider Triage Evaluation Note  Sherry Watkins , a 40 y.o. female  was evaluated in triage.  Pt complains of headache congestion, sore throat, cough and body aches.  Symptoms began yesterday.  No known sick contacts.  No shortness of breath or chest pain.  No GI complaints.  No urinary symptoms.  Review of Systems  Positive: Headache, congestion, sore throat, body aches Negative: Chest pain, shortness of breath, GI complaints  Physical Exam  BP (!) 154/72   Pulse 82   Temp 98.2 F (36.8 C) (Oral)   Resp 18   Ht 5\' 2"  (1.575 m)   Wt 111.1 kg   LMP 05/13/2015 (Approximate)   SpO2 94%   BMI 44.81 kg/m  Gen:   Awake, no distress   Resp:  Normal effort  MSK:   Moves extremities without difficulty  Other:    Medical Decision Making  Medically screening exam initiated at 6:40 PM.  Appropriate orders placed.  PHOENICIA PIRIE was informed that the remainder of the evaluation will be completed by another provider, this initial triage assessment does not replace that evaluation, and the importance of remaining in the ED until their evaluation is complete.  Patient presents with headache, congestion, sore throat, cough x2 days.  No reported fever.  No reported GI symptoms.  No chest pain or shortness of breath.  Patient will have COVID/flu swab and chest x-ray.   Brynda Peon 10/12/21 1841    Nance Pear, MD 10/12/21 [phone]

## 2021-10-12 NOTE — ED Triage Notes (Signed)
Pt reports headache, bodyaches and sore throat since yesterday, denies fevers

## 2021-10-12 NOTE — ED Provider Notes (Signed)
[facility] Emergency Department Provider Note  ____________________________________________  Time seen: Approximately 8:24 PM  I have reviewed the triage vital signs and the nursing notes.   HISTORY  Chief Complaint Generalized Body Aches, Headache, and Sore Throat    HPI Sherry Watkins is a 40 y.o. female who presents the emergency department complaining of multiple complaints including headache, congestion, sore throat, cough and body aches.  Symptoms began yesterday.  No known specific sick contact.  I had seen the patient in triage prior to her being roomed.  At that time she had denied any shortness of breath, chest pain and continues to do so.  There is no emesis, diarrhea.  No urinary symptoms.       Past Medical History:  Diagnosis Date   Complication of anesthesia    Depression    Endometriosis    GERD (gastroesophageal reflux disease)    NO MEDS   Headache    FREQUENTLY   Hypertension    Obesity    Ovarian cyst     Patient Active Problem List   Diagnosis Date Noted   Chest pain 04/22/2021   Shortness of breath 04/22/2021   Chronic heart failure with preserved ejection fraction (HFpEF) ([place]) 04/22/2021   Endometriosis 10/17/2018   Adenomyosis 08/17/2018   Chronic low back pain (Right) without sciatica 02/09/2018   Elevated sed rate 02/01/2018   Vitamin D deficiency 02/01/2018   Chronic sacroiliac joint pain (Right) 02/01/2018   Other specified dorsopathies, sacral and sacrococcygeal region 02/01/2018   Chronic abdominal pain (RLQ) (Primary Area of Pain) (Right) 12/26/2017   Chronic lower extremity pain (Secondary Area of Pain) (Right) 12/26/2017   Chronic low back pain with right-sided sciatica [facility] Area of Pain) (Bilateral) (R>L) 12/26/2017   Chronic pain syndrome 12/26/2017   Opiate use 12/26/2017   Problems influencing health status 12/26/2017   Pharmacologic therapy 12/26/2017   Disorder of skeletal system 12/26/2017     Past Surgical History:  Procedure Laterality Date   ABLATION     CESAREAN SECTION     CYSTOSCOPY N/A 08/24/2018   Procedure: CYSTOSCOPY;  Surgeon: Will Bonnet, MD;  Location: [facility] ORS;  Service: Gynecology;  Laterality: N/A;   HYSTEROSCOPY WITH NOVASURE N/A 05/27/2015   Procedure: HYSTEROSCOPY WITH NOVASURE;  Surgeon: Aletha Halim, MD;  Location: [facility] ORS;  Service: Gynecology;  Laterality: N/A;   LAPAROSCOPY N/A 07/07/2017   Procedure: LAPAROSCOPY DIAGNOSTIC;  Surgeon: Malachy Mood, MD;  Location: [facility] ORS;  Service: Gynecology;  Laterality: N/A;   PARTIAL HYSTERECTOMY     ROBOTIC ASSISTED TOTAL HYSTERECTOMY Bilateral 08/24/2018   Procedure: ROBOTIC ASSISTED TOTAL HYSTERECTOMY BILATERAL SALPINGECTOMY;  Surgeon: Will Bonnet, MD;  Location: [facility] ORS;  Service: Gynecology;  Laterality: Bilateral;   TUBAL LIGATION      Prior to Admission medications   Medication Sig Start Date End Date Taking? Authorizing Provider  benzonatate (TESSALON PERLES) 100 MG capsule Take 1 capsule (100 mg total) by mouth 3 (three) times daily as needed for cough. 10/12/21 10/12/22 Yes Ezinne Yogi, Charline Bills, PA-C  brompheniramine-pseudoephedrine-DM 30-2-10 MG/5ML syrup Take 10 mLs by mouth 4 (four) times daily as needed. 10/12/21  Yes Javier Gell, Charline Bills, PA-C  fluticasone (FLONASE) 50 MCG/ACT nasal spray Place 1 spray into both nostrils 2 (two) times daily. 10/12/21  Yes Jonnae Fonseca, Charline Bills, PA-C  acetaminophen (TYLENOL) 500 MG tablet Take 500 mg by mouth every 6 (six) hours as needed.    [provider]  famotidine (PEPCID) 20 MG  tablet Take 20 mg by mouth daily. Patient not taking: Reported on 08/03/2021    [provider]  furosemide (LASIX) 20 MG tablet Take 1 tablet (20 mg total) by mouth daily. 04/09/21 04/09/22  Alisa Graff, FNP  losartan (COZAAR) 100 MG tablet Take 1 tablet (100 mg total) by mouth daily. 08/03/21   Marrianne Mood D, PA-C  Melatonin 10 MG CAPS  Take 20 mg by mouth at bedtime.    [provider]  metoprolol succinate (TOPROL-XL) 25 MG 24 hr tablet Take 1 tablet (25 mg total) by mouth daily. 08/03/21 10/02/21  Marrianne Mood D, PA-C  potassium chloride (KLOR-CON) 10 MEQ tablet Take 10 mEq by mouth 2 (two) times daily.    [provider]    Allergies Patient has no known allergies.  Family History  Problem Relation Age of Onset   Diabetes Mother    Hypertension Mother    Kidney failure Mother    Cancer Father    Heart attack Father    Heart failure Father    Heart disease Brother     Social History Social History   Tobacco Use   Smoking status: Former    Years: 15.00    Types: Cigarettes    Quit date: 06/30/2004    Years since quitting: 17.2   Smokeless tobacco: Never  Vaping Use   Vaping Use: Never used  Substance Use Topics   Alcohol use: Yes    Alcohol/week: 4.0 standard drinks    Types: 4 Glasses of wine per week   Drug use: No     Review of Systems  Constitutional: No fever/chills.  Positive for body aches Eyes: No visual changes. No discharge ENT: Positive for nasal congestion and sore throat Cardiovascular: no chest pain. Respiratory: Positive cough. No SOB. Gastrointestinal: No abdominal pain.  No nausea, no vomiting.  No diarrhea.  No constipation. Musculoskeletal: Negative for musculoskeletal pain. Skin: Negative for rash, abrasions, lacerations, ecchymosis. Neurological: Negative for headaches, focal weakness or numbness.  10 System ROS otherwise negative.  ____________________________________________   PHYSICAL EXAM:  VITAL SIGNS: ED Triage Vitals  Enc Vitals Group     BP 10/12/21 1837 (!) 154/72     Pulse Rate 10/12/21 1836 82     Resp 10/12/21 1836 18     Temp 10/12/21 1836 98.2 F (36.8 C)     Temp Source 10/12/21 1836 Oral     SpO2 10/12/21 1836 94 %     Weight 10/12/21 1838 245 lb (111.1 kg)     Height 10/12/21 1838 5\' 2"  (1.575 m)     Head Circumference --       Peak Flow --      Pain Score 10/12/21 1838 8     Pain Loc --      Pain Edu? --      Excl. in [place]? --      Constitutional: Alert and oriented. Well appearing and in no acute distress. Eyes: Conjunctivae are normal. PERRL. EOMI. Head: Atraumatic. ENT:      Ears: EACs and TMs unremarkable bilaterally.      Nose: No congestion/rhinnorhea.      Mouth/Throat: Mucous membranes are moist.  Oropharynx is mildly erythematous but not edematous and no exudates. Neck: No stridor.  Neck is supple full range of motion.  No tenderness on physical exam Hematological/Lymphatic/Immunilogical: No cervical lymphadenopathy. Cardiovascular: Normal rate, regular rhythm. Normal S1 and S2.  Good peripheral circulation. Respiratory: Normal respiratory effort without tachypnea or retractions.  Lungs CTAB. Good air entry to the bases with no decreased or absent breath sounds. Gastrointestinal: Bowel sounds 4 quadrants. Soft and nontender to palpation. No guarding or rigidity. No palpable masses. No distention. No CVA tenderness. Musculoskeletal: Full range of motion to all extremities. No gross deformities appreciated. Neurologic:  Normal speech and language. No gross focal neurologic deficits are appreciated.  Skin:  Skin is warm, dry and intact. No rash noted. Psychiatric: Mood and affect are normal. Speech and behavior are normal. Patient exhibits appropriate insight and judgement.   ____________________________________________   LABS (all labs ordered are listed, but only abnormal results are displayed)  Labs Reviewed  RESP PANEL BY RT-PCR (FLU A&B, COVID) ARPGX2   ____________________________________________  EKG   ____________________________________________  RADIOLOGY I personally viewed and evaluated these images as part of my medical decision making, as well as reviewing the written report by the radiologist.  ED Provider Interpretation: No consolidation concerning for pneumonia  DG  Chest 2 View  Result Date: 10/12/2021 CLINICAL DATA:  Cough EXAM: CHEST - 2 VIEW COMPARISON:  04/08/2021 FINDINGS: The heart size and mediastinal contours are within normal limits. Both lungs are clear. The visualized skeletal structures are unremarkable. IMPRESSION: No active cardiopulmonary disease. Electronically Signed   By: Donavan Foil M.D.   On: 10/12/2021 19:11    ____________________________________________    PROCEDURES  Procedure(s) performed:    Procedures    Medications - No data to display   ____________________________________________   INITIAL IMPRESSION / ASSESSMENT AND PLAN / ED COURSE  Pertinent labs & imaging results that were available during my care of the patient were reviewed by me and considered in my medical decision making (see chart for details).  Review of the [place] CSRS was performed in accordance of the [place] prior to dispensing any controlled drugs.           Patient's diagnosis is consistent with viral illness.  Patient presented with headache, congestion, sore throat, cough x2 days.  No fever at home.  Patient denied any chest pain, shortness of breath, GI complaints, urinary symptoms.  Swab was negative for influenza and COVID.  Chest x-ray was reassuring with no evidence of consolidation concerning for pneumonia..  Return precautions discussed with the patient.  Follow-up with primary care as needed.  Patient is given ED precautions to return to the ED for any worsening or new symptoms.     ____________________________________________  FINAL CLINICAL IMPRESSION(S) / ED DIAGNOSES  Final diagnoses:  Viral illness      NEW MEDICATIONS STARTED DURING THIS VISIT:  ED Discharge Orders          Ordered    brompheniramine-pseudoephedrine-DM 30-2-10 MG/5ML syrup  4 times daily PRN        10/12/21 2036    benzonatate (TESSALON PERLES) 100 MG capsule  3 times daily PRN        10/12/21 2036    fluticasone (FLONASE) 50 MCG/ACT nasal spray   2 times daily        10/12/21 2036                This chart was dictated using voice recognition software/Dragon. Despite best efforts to proofread, errors can occur which can change the meaning. Any change was purely unintentional.    Darletta Moll, PA-C 10/12/21 2036    Nance Pear, MD 10/12/21 2223

## 2021-10-20 ENCOUNTER — Other Ambulatory Visit: Payer: Self-pay

## 2021-10-20 ENCOUNTER — Emergency Department
Admission: EM | Admit: 2021-10-20 | Discharge: 2021-10-20 | Disposition: A | Discharge status: Home / Self Care | Payer: Medicaid Other | Attending: Emergency Medicine | Admitting: Emergency Medicine

## 2021-10-20 DIAGNOSIS — Z79899 Other long term (current) drug therapy: Secondary | ICD-10-CM | POA: Insufficient documentation

## 2021-10-20 DIAGNOSIS — Z87891 Personal history of nicotine dependence: Secondary | ICD-10-CM | POA: Insufficient documentation

## 2021-10-20 DIAGNOSIS — I110 Hypertensive heart disease with heart failure: Secondary | ICD-10-CM | POA: Insufficient documentation

## 2021-10-20 DIAGNOSIS — Z20822 Contact with and (suspected) exposure to covid-19: Secondary | ICD-10-CM | POA: Insufficient documentation

## 2021-10-20 DIAGNOSIS — I509 Heart failure, unspecified: Secondary | ICD-10-CM | POA: Insufficient documentation

## 2021-10-20 DIAGNOSIS — J101 Influenza due to other identified influenza virus with other respiratory manifestations: Secondary | ICD-10-CM | POA: Insufficient documentation

## 2021-10-20 LAB — RESP PANEL BY RT-PCR (FLU A&B, COVID) ARPGX2
Influenza A by PCR: POSITIVE — AB
Influenza B by PCR: NEGATIVE
SARS Coronavirus 2 by RT PCR: NEGATIVE

## 2021-10-20 NOTE — ED Provider Notes (Signed)
[facility] Emergency Department Provider Note    ____________________________________________   Event Date/Time   First MD Initiated Contact with Patient 10/20/21 1636     (approximate)  I have reviewed the triage vital signs and the nursing notes.   HISTORY  Chief Complaint Headache, Fever, and Chills   HPI Sherry Watkins is a 40 y.o. female, history of hypertension, depression, and GERD, presents the emergency department for evaluation of flulike symptoms.  Patient reports headache, sinus congestion, sore throat, cough, and fever. She states that her symptoms began approximately 8 days ago.  She was seen here in the emergency department on 10/12/2021 where she was tested for COVID/influenza, which were both ultimately negative.  She has been treating her symptoms at home with cough syrup.  She states that she started feeling better after her last visit, but has now progressively  gotten worse over the past 4 days.  Denies chest pain, shortness of breath, abdominal pain, back pain, or urinary symptoms.  History limited by: No limitations.  Past Medical History:  Diagnosis Date   Complication of anesthesia    Depression    Endometriosis    GERD (gastroesophageal reflux disease)    NO MEDS   Headache    FREQUENTLY   Hypertension    Obesity    Ovarian cyst     Patient Active Problem List   Diagnosis Date Noted   Chest pain 04/22/2021   Shortness of breath 04/22/2021   Chronic heart failure with preserved ejection fraction (HFpEF) ([place]) 04/22/2021   Endometriosis 10/17/2018   Adenomyosis 08/17/2018   Chronic low back pain (Right) without sciatica 02/09/2018   Elevated sed rate 02/01/2018   Vitamin D deficiency 02/01/2018   Chronic sacroiliac joint pain (Right) 02/01/2018   Other specified dorsopathies, sacral and sacrococcygeal region 02/01/2018   Chronic abdominal pain (RLQ) (Primary Area of Pain) (Right) 12/26/2017   Chronic lower extremity  pain (Secondary Area of Pain) (Right) 12/26/2017   Chronic low back pain with right-sided sciatica [facility] Area of Pain) (Bilateral) (R>L) 12/26/2017   Chronic pain syndrome 12/26/2017   Opiate use 12/26/2017   Problems influencing health status 12/26/2017   Pharmacologic therapy 12/26/2017   Disorder of skeletal system 12/26/2017    Past Surgical History:  Procedure Laterality Date   ABLATION     CESAREAN SECTION     CYSTOSCOPY N/A 08/24/2018   Procedure: CYSTOSCOPY;  Surgeon: Will Bonnet, MD;  Location: [facility] ORS;  Service: Gynecology;  Laterality: N/A;   HYSTEROSCOPY WITH NOVASURE N/A 05/27/2015   Procedure: HYSTEROSCOPY WITH NOVASURE;  Surgeon: Aletha Halim, MD;  Location: [facility] OR[facility];  Service: Gynecology;  Laterality: N/A;   LAPAROSCOPY N/A 07/07/2017   Procedure: LAPAROSCOPY DIAGNOSTIC;  Surgeon: Malachy Mood, MD;  Location: [facility] ORS;  Service: Gynecology;  Laterality: N/A;   PARTIAL HYSTERECTOMY     ROBOTIC ASSISTED TOTAL HYSTERECTOMY Bilateral 08/24/2018   Procedure: ROBOTIC ASSISTED TOTAL HYSTERECTOMY BILATERAL SALPINGECTOMY;  Surgeon: Will Bonnet, MD;  Location: [facility] ORS;  Service: Gynecology;  Laterality: Bilateral;   TUBAL LIGATION      Prior to Admission medications   Medication Sig Start Date End Date Taking? Authorizing Provider  acetaminophen (TYLENOL) 500 MG tablet Take 500 mg by mouth every 6 (six) hours as needed.    [provider]  benzonatate (TESSALON PERLES) 100 MG capsule Take 1 capsule (100 mg total) by mouth 3 (three) times daily as needed for cough. 10/12/21 10/12/22  Cuthriell, Charline Bills, PA-C  brompheniramine-pseudoephedrine-DM 30-2-10 MG/5ML syrup Take 10 mLs by mouth 4 (four) times daily as needed. 10/12/21   Cuthriell, Charline Bills, PA-C  famotidine (PEPCID) 20 MG tablet Take 20 mg by mouth daily. Patient not taking: Reported on 08/03/2021    [provider]  fluticasone (FLONASE) 50 MCG/ACT nasal spray Place 1 spray  into both nostrils 2 (two) times daily. 10/12/21   Cuthriell, Charline Bills, PA-C  furosemide (LASIX) 20 MG tablet Take 1 tablet (20 mg total) by mouth daily. 04/09/21 04/09/22  Alisa Graff, FNP  losartan (COZAAR) 100 MG tablet Take 1 tablet (100 mg total) by mouth daily. 08/03/21   Marrianne Mood D, PA-C  Melatonin 10 MG CAPS Take 20 mg by mouth at bedtime.    [provider]  metoprolol succinate (TOPROL-XL) 25 MG 24 hr tablet Take 1 tablet (25 mg total) by mouth daily. 08/03/21 10/02/21  Marrianne Mood D, PA-C  potassium chloride (KLOR-CON) 10 MEQ tablet Take 10 mEq by mouth 2 (two) times daily.    [provider]    Allergies Patient has no known allergies.  Family History  Problem Relation Age of Onset   Diabetes Mother    Hypertension Mother    Kidney failure Mother    Cancer Father    Heart attack Father    Heart failure Father    Heart disease Brother     Social History Social History   Tobacco Use   Smoking status: Former    Years: 15.00    Types: Cigarettes    Quit date: 06/30/2004    Years since quitting: 17.3   Smokeless tobacco: Never  Vaping Use   Vaping Use: Never used  Substance Use Topics   Alcohol use: Yes    Alcohol/week: 4.0 standard drinks    Types: 4 Glasses of wine per week   Drug use: No    Review of Systems  Constitutional: Positive for fever.  Negative for fever/chills, weight loss, or fatigue.  Eyes: Negative for visual changes or discharge.  ENT: Positive for sinus congestion and cough.  Negative for  hearing changes, or sore throat.  Gastrointestinal: Negative for abdominal pain, nausea/vomiting, or diarrhea.  Genitourinary: Negative for dysuria or hematuria.  Musculoskeletal: Negative for back pain or joint pain.  Skin: Negative for rashes or lesions.  Neurological: Negative for headache, syncope, dizziness, tremors, or numbness/tingling.   10-point ROS otherwise  negative. ____________________________________________   PHYSICAL EXAM:  VITAL SIGNS: ED Triage Vitals  Enc Vitals Group     BP 10/20/21 1435 (!) 146/92     Pulse Rate 10/20/21 1435 80     Resp 10/20/21 1435 16     Temp 10/20/21 1435 98.6 F (37 C)     Temp Source 10/20/21 1435 Oral     SpO2 10/20/21 1435 100 %     Weight 10/20/21 1436 245 lb (111.1 kg)     Height 10/20/21 1436 5\' 2"  (1.575 m)     Head Circumference --      Peak Flow --      Pain Score 10/20/21 1436 8     Pain Loc --      Pain Edu? --      Excl. in [place]? --     Physical Exam Constitutional:      Appearance: She is well-developed.  HENT:     Head: Normocephalic and atraumatic.     Nose: Nose normal.     Mouth/Throat:     Mouth: Mucous membranes  are moist.     Pharynx: Oropharynx is clear. No oropharyngeal exudate or posterior oropharyngeal erythema.  Eyes:     Extraocular Movements: Extraocular movements intact.     Conjunctiva/sclera: Conjunctivae normal.     Pupils: Pupils are equal, round, and reactive to light.  Cardiovascular:     Rate and Rhythm: Normal rate and regular rhythm.     Pulses: Normal pulses.     Heart sounds: Normal heart sounds.  Pulmonary:     Effort: Pulmonary effort is normal.     Breath sounds: Normal breath sounds. No wheezing or rhonchi.  Abdominal:     General: Abdomen is flat.     Palpations: Abdomen is soft.  Musculoskeletal:        General: Normal range of motion.     Cervical back: Normal range of motion.  Skin:    General: Skin is warm and dry.  Neurological:     Mental Status: She is alert and oriented to person, place, and time.  Psychiatric:        Mood and Affect: Mood normal.        Behavior: Behavior normal.        Thought Content: Thought content normal.        Judgment: Judgment normal.     ____________________________________________    LABS  (all labs ordered are listed, but only abnormal results are displayed)  Labs Reviewed  RESP PANEL BY  RT-PCR (FLU A&B, COVID) ARPGX2 - Abnormal; Notable for the following components:      Result Value   Influenza A by PCR POSITIVE (*)    All other components within normal limits     ____________________________________________   EKG None.   ____________________________________________    RADIOLOGY I personally viewed and evaluated these images as part of my medical decision making, as well as reviewing the written report by the radiologist.  ED Provider Interpretation: None.  No results found.  ____________________________________________   PROCEDURES  Procedures   Medications - No data to display  Critical Care performed: No  ____________________________________________   INITIAL IMPRESSION / ASSESSMENT AND PLAN / ED COURSE  Pertinent labs & imaging results that were available during my care of the patient were reviewed by me and considered in my medical decision making (see chart for details).        Sherry [facility] is a 40 y.o. female, history of hypertension, depression, and GERD, presents the emergency department for evaluation of flulike symptoms.  Patient reports headache, sinus congestion, sore throat, cough, and fever. She states that her symptoms began approximately 8 days ago.  She was seen here in the emergency department on 10/12/2021 where she was tested for COVID/influenza, which were both ultimately negative.  She has been treating her symptoms at home with cough syrup.  She states that she started feeling better after her last visit, but has now progressively  gotten worse over the past 4 days.    Upon presentation, patient appears well.  Lung sounds are clear bilaterally.  Overall unremarkable physical exam.  Respiratory panel shows positive influenza A infection.  I suspect this to be the reason for her double worsening of symptoms, going from initial viral infection to now recent positive influenza A infection. No concern for any secondary  complications or other life-threatening pathology.  No further work-up or treatment in the emergency department is indicated at this time.  We will plan to discharge with anticipatory guidance and follow-up as needed  ____________________________________________   FINAL CLINICAL IMPRESSION(S) / ED DIAGNOSES  Final diagnoses:  Influenza A     NEW MEDICATIONS STARTED DURING THIS VISIT:  ED Discharge Orders     None        Note:  This document was prepared using Dragon voice recognition software and may include unintentional dictation errors.    Teodoro Spray, [place] 10/20/21 1949    Vladimir Crofts, MD 10/23/21 [phone]

## 2021-10-20 NOTE — ED Triage Notes (Signed)
Pt states that she cont to have headache, chills, and fever, states that she can't smell

## 2021-12-27 NOTE — Progress Notes (Deleted)
Cardiology Office Note:    Date:  12/27/2021   ID:  Sherry Watkins, DOB January 14, 1981, MRN [id number]  PCP:  [facility] [place] Providers Cardiologist:  None { Click to update primary MD,subspecialty MD or APP then REFRESH:1}    Referring MD: [facility] [place]   No chief complaint on file. ***  History of Present Illness:    Sherry Watkins is a 41 y.o. female with a hx of chronic HFpEF, chronic low back pain, HTN, GERD, chronic chest pain    Past medical history is significant for multiple ED visit with chest pain between Feb 2021 and last office visit with Sherry Mood, PA on 08/03/21.  She had ETT June 2022 that revealed low risk without evidence of ischemia, hypertensive BP response.   At her last office visit 9/22, she was felt to be volume up and her BP was elevated in the setting of being off her medications including Lasix, metoprolol, and losartan. She was advised to follow-up in 4 weeks.   Today, she   Past Medical History:  Diagnosis Date   Complication of anesthesia    Depression    Endometriosis    GERD (gastroesophageal reflux disease)    NO MEDS   Headache    FREQUENTLY   Hypertension    Obesity    Ovarian cyst     Past Surgical History:  Procedure Laterality Date   ABLATION     CESAREAN SECTION     CYSTOSCOPY N/A 08/24/2018   Procedure: CYSTOSCOPY;  Surgeon: Will Bonnet, MD;  Location: [facility];  Service: Gynecology;  Laterality: N/A;   HYSTEROSCOPY WITH NOVASURE N/A 05/27/2015   Procedure: HYSTEROSCOPY WITH NOVASURE;  Surgeon: Aletha Halim, MD;  Location: [facility];  Service: Gynecology;  Laterality: N/A;   LAPAROSCOPY N/A 07/07/2017   Procedure: LAPAROSCOPY DIAGNOSTIC;  Surgeon: Malachy Mood, MD;  Location: [facility];  Service: Gynecology;  Laterality: N/A;   PARTIAL HYSTERECTOMY     ROBOTIC ASSISTED TOTAL HYSTERECTOMY Bilateral 08/24/2018   Procedure: ROBOTIC ASSISTED TOTAL HYSTERECTOMY BILATERAL  SALPINGECTOMY;  Surgeon: Will Bonnet, MD;  Location: [facility];  Service: Gynecology;  Laterality: Bilateral;   TUBAL LIGATION      Current Medications: No outpatient medications have been marked as taking for the 12/28/21 encounter (Appointment) with Rise Mu, PA-C.     Allergies:   Patient has no known allergies.   Social History   Socioeconomic History   Marital status: Single    Spouse name: Not on file   Number of children: Not on file   Years of education: Not on file   Highest education level: Not on file  Occupational History   Not on file  Tobacco Use   Smoking status: Former    Years: 15.00    Types: Cigarettes    Quit date: 06/30/2004    Years since quitting: 17.5   Smokeless tobacco: Never  Vaping Use   Vaping Use: Never used  Substance and Sexual Activity   Alcohol use: Yes    Alcohol/week: 4.0 standard drinks    Types: 4 Glasses of wine per week   Drug use: No   Sexual activity: Yes    Birth control/protection: None  Other Topics Concern   Not on file  Social History Narrative   Not on file   Social Determinants of Health   Financial Resource Strain: Not on file  Food Insecurity: Not on file  Transportation Needs:  Not on file  Physical Activity: Not on file  Stress: Not on file  Social Connections: Not on file     Family History: The patient's ***family history includes Cancer in her father; Diabetes in her mother; Heart attack in her father; Heart disease in her brother; Heart failure in her father; Hypertension in her mother; Kidney failure in her mother.  ROS:   Please see the history of present illness.    *** All other systems reviewed and are negative.  Labs/Other Studies Reviewed:    The following studies were reviewed today:  ETT 6/22  Baseline EKG demonstrates normal sinus rhythm. The patient exhibited fair exercise capacity with a hypertensive blood pressure response. No angina was reported. No significant ST segment or T  wave changes were observed during stress or recovery. No significant arrhythmia occurred. Low risk exercise tolerance test (Duke Treadmill Score = +6).   Low risk exercise tolerance test without evidence of ischemia at workload achieved.  Hypertensive blood pressure response noted.  Echo 5/22 Left Ventricle: Left ventricular ejection fraction, by estimation, is 60  to 65%. The left ventricle has normal function. The left ventricle has no  regional wall motion abnormalities. The average left ventricular global  longitudinal strain is -17.9 %.  The global longitudinal strain is normal. The left ventricular internal  cavity size was normal in size. There is no left ventricular hypertrophy.  Left ventricular diastolic parameters are consistent with Grade I  diastolic dysfunction (impaired  relaxation).  Right Ventricle: The right ventricular size is normal. No increase in  right ventricular wall thickness. Right ventricular systolic function is  normal. Tricuspid regurgitation signal is inadequate for assessing PA  pressure.  Left Atrium: Left atrial size was normal in size.  Right Atrium: Right atrial size was normal in size.  Pericardium: There is no evidence of pericardial effusion.  Mitral Valve: The mitral valve is normal in structure. No evidence of  mitral valve regurgitation. No evidence of mitral valve stenosis. MV peak  gradient, 4.4 mmHg. The mean mitral valve gradient is 2.0 mmHg.  Tricuspid Valve: The tricuspid valve is normal in structure. Tricuspid  valve regurgitation is not demonstrated. No evidence of tricuspid  stenosis.  Aortic Valve: The aortic valve is normal in structure. Aortic valve  regurgitation is not visualized. No aortic stenosis is present. Aortic  valve mean gradient measures 5.0 mmHg. Aortic valve peak gradient measures  8.9 mmHg. Aortic valve area, by VTI  measures 2.26 cm.  Pulmonic Valve: The pulmonic valve was normal in structure. Pulmonic valve   regurgitation is not visualized. No evidence of pulmonic stenosis.  Aorta: The aortic root is normal in size and structure.  Venous: The inferior vena cava is normal in size with greater than 50%  respiratory variability, suggesting right atrial pressure of 3 mmHg.  IAS/Shunts: No atrial level shunt detected by color flow Doppler.    CTA Chest PE 4/21  IMPRESSION: 1. No evidence for acute pulmonary embolus. 2. Moderate size hiatal hernia.   Recent Labs: 03/30/2021: ALT 52; B Natriuretic Peptide 8.6 04/06/2021: Hemoglobin 13.1; Platelets 458 08/12/2021: BUN 7; Creatinine, Ser 0.92; Potassium 4.2; Sodium 139  Recent Lipid Panel No results found for: CHOL, TRIG, HDL, CHOLHDL, VLDL, LDLCALC, LDLDIRECT   Risk Assessment/Calculations:   {Does this patient have ATRIAL FIBRILLATION?:[phone]}       Physical Exam:    VS:  LMP 05/13/2015 (Approximate)     Wt Readings from Last 3 Encounters:  10/20/21 245 lb (111.1  kg)  10/12/21 245 lb (111.1 kg)  08/03/21 253 lb 4 oz (114.9 kg)     GEN: *** Well nourished, well developed in no acute distress HEENT: Normal NECK: No JVD; No carotid bruits CARDIAC: ***RRR, no murmurs, rubs, gallops RESPIRATORY:  Clear to auscultation without rales, wheezing or rhonchi  ABDOMEN: Soft, non-tender, non-distended MUSCULOSKELETAL:  No edema; No deformity. *** pedal pulses, ***bilaterally SKIN: Warm and dry NEUROLOGIC:  Alert and oriented x 3 PSYCHIATRIC:  Normal affect   EKG:  EKG is *** ordered today.  The ekg ordered today demonstrates ***  Diagnoses:    No diagnosis found. Assessment and Plan:     ***      {Are you ordering a CV Procedure (e.g. stress test, cath, DCCV, TEE, etc)?   Press F2        :[id number]}    Medication Adjustments/Labs and Tests Ordered: Current medicines are reviewed at length with the patient today.  Concerns regarding medicines are outlined above.  No orders of the defined types were placed in this  encounter.  No orders of the defined types were placed in this encounter.   There are no Patient Instructions on file for this visit.   Signed, Emmaline Life, NP  12/27/2021 4:55 PM    [facility]

## 2021-12-28 ENCOUNTER — Ambulatory Visit: Payer: Self-pay | Admitting: Physician Assistant

## 2022-01-02 NOTE — Progress Notes (Unsigned)
Cardiology Office Note    Date:  01/02/2022   ID:  Sherry Watkins, Sherry Watkins November 20, 1981, MRN [id number]  PCP:  [facility] [place]  Cardiologist:  Nelva Bush, MD  Electrophysiologist:  None   Chief Complaint: Follow-up  History of Present Illness:   Sherry Watkins is a 41 y.o. female with history of HFpEF, HTN, COVID in 12/2019, endometriosis, frequent headaches, GERD, morbid obesity, and influenza A in 09/2021 who presents for follow-up of  ***.  Throughout 2022, it was noted she has been evaluated in the ER multiple times with symptoms of chest discomfort.  Subsequent echo in 03/2021 demonstrated an EF of 60 to 65%, no regional wall motion maladies, grade 1 diastolic dysfunction, normal RV systolic function and ventricular cavity size, and no significant valvular abnormalities.  ETT in 04/2021 was low risk without evidence of ischemia.  There was a hypertensive response noted.  She was last seen in the office in 07/2021 and continued to note ongoing symptoms of chest discomfort.  BP was elevated at 140/100, she had not yet taken her medications at that time.  It was noted her weight had trended from 238 pounds to 253 pounds when compared to her visit in 04/2021.  She was given metoprolol in the office.  Reassurance was provided given the patient's chronic chest pain.  It was recommended she increase her furosemide to 40 mg daily for 3 days followed by reduction back down to her prior dose.  She was continued on metoprolol and losartan.  ***   Labs independently reviewed: 07/2021 - BUN 7, serum creatinine 0.92, potassium 4.2 03/2021 - Hgb 13.1, PLT 458, albumin 4.0, AST 77, ALT 52  Past Medical History:  Diagnosis Date   Complication of anesthesia    Depression    Endometriosis    GERD (gastroesophageal reflux disease)    NO MEDS   Headache    FREQUENTLY   Hypertension    Obesity    Ovarian cyst     Past Surgical History:  Procedure Laterality Date   ABLATION      CESAREAN SECTION     CYSTOSCOPY N/A 08/24/2018   Procedure: CYSTOSCOPY;  Surgeon: Will Bonnet, MD;  Location: [facility];  Service: Gynecology;  Laterality: N/A;   HYSTEROSCOPY WITH NOVASURE N/A 05/27/2015   Procedure: HYSTEROSCOPY WITH NOVASURE;  Surgeon: Aletha Halim, MD;  Location: [facility];  Service: Gynecology;  Laterality: N/A;   LAPAROSCOPY N/A 07/07/2017   Procedure: LAPAROSCOPY DIAGNOSTIC;  Surgeon: Malachy Mood, MD;  Location: [facility];  Service: Gynecology;  Laterality: N/A;   PARTIAL HYSTERECTOMY     ROBOTIC ASSISTED TOTAL HYSTERECTOMY Bilateral 08/24/2018   Procedure: ROBOTIC ASSISTED TOTAL HYSTERECTOMY BILATERAL SALPINGECTOMY;  Surgeon: Will Bonnet, MD;  Location: [facility];  Service: Gynecology;  Laterality: Bilateral;   TUBAL LIGATION      Current Medications: No outpatient medications have been marked as taking for the 01/04/22 encounter (Appointment) with Rise Mu, PA-C.    Allergies:   Patient has no known allergies.   Social History   Socioeconomic History   Marital status: Single    Spouse name: Not on file   Number of children: Not on file   Years of education: Not on file   Highest education level: Not on file  Occupational History   Not on file  Tobacco Use   Smoking status: Former    Years: 15.00    Types: Cigarettes    Quit date: 06/30/2004  Years since quitting: 17.5   Smokeless tobacco: Never  Vaping Use   Vaping Use: Never used  Substance and Sexual Activity   Alcohol use: Yes    Alcohol/week: 4.0 standard drinks    Types: 4 Glasses of wine per week   Drug use: No   Sexual activity: Yes    Birth control/protection: None  Other Topics Concern   Not on file  Social History Narrative   Not on file   Social Determinants of Health   Financial Resource Strain: Not on file  Food Insecurity: Not on file  Transportation Needs: Not on file  Physical Activity: Not on file  Stress: Not on file  Social Connections: Not on  file     Family History:  The patient's family history includes Cancer in her father; Diabetes in her mother; Heart attack in her father; Heart disease in her brother; Heart failure in her father; Hypertension in her mother; Kidney failure in her mother.  ROS:   ROS   EKGs/Labs/Other Studies Reviewed:    Studies reviewed were summarized above. The additional studies were reviewed today:  ETT 04/30/2021: Baseline EKG demonstrates normal sinus rhythm. The patient exhibited fair exercise capacity with a hypertensive blood pressure response. No angina was reported. No significant ST segment or T wave changes were observed during stress or recovery. No significant arrhythmia occurred. Low risk exercise tolerance test (Duke Treadmill Score = +6).   Low risk exercise tolerance test without evidence of ischemia at workload achieved. Hypertensive blood pressure response noted. __________  2D echo 04/16/2021: 1. Left ventricular ejection fraction, by estimation, is 60 to 65%. The  left ventricle has normal function. The left ventricle has no regional  wall motion abnormalities. Left ventricular diastolic parameters are  consistent with Grade I diastolic  dysfunction (impaired relaxation). The average left ventricular global  longitudinal strain is -17.9 %. The global longitudinal strain is normal.   2. Right ventricular systolic function is normal. The right ventricular  size is normal. Tricuspid regurgitation signal is inadequate for assessing  PA pressure.    EKG:  EKG is ordered today.  The EKG ordered today demonstrates ***  Recent Labs: 03/30/2021: ALT 52; B Natriuretic Peptide 8.6 04/06/2021: Hemoglobin 13.1; Platelets 458 08/12/2021: BUN 7; Creatinine, Ser 0.92; Potassium 4.2; Sodium 139  Recent Lipid Panel No results found for: CHOL, TRIG, HDL, CHOLHDL, VLDL, LDLCALC, LDLDIRECT  PHYSICAL EXAM:    VS:  LMP 05/13/2015 (Approximate)   BMI: There is no height or weight on file to  calculate BMI.  Physical Exam  Wt Readings from Last 3 Encounters:  10/20/21 245 lb (111.1 kg)  10/12/21 245 lb (111.1 kg)  08/03/21 253 lb 4 oz (114.9 kg)     ASSESSMENT & PLAN:   HFpEF:  Chronic chest pain:  HTN: Blood pressure ***   {Are you ordering a CV Procedure (e.g. stress test, cath, DCCV, TEE, etc)?   Press F2        :[id number]}     Disposition: F/u with Dr. Saunders Revel or an APP in ***.   Medication Adjustments/Labs and Tests Ordered: Current medicines are reviewed at length with the patient today.  Concerns regarding medicines are outlined above. Medication changes, Labs and Tests ordered today are summarized above and listed in the Patient Instructions accessible in Encounters.   SignedChristell Faith, PA-C 01/02/2022 11:42 AM     [place] [address] [place] [address] [place], [place] [address] [phone]

## 2022-01-04 ENCOUNTER — Ambulatory Visit: Payer: Self-pay | Admitting: Physician Assistant

## 2022-03-03 ENCOUNTER — Other Ambulatory Visit: Payer: Self-pay

## 2022-03-03 ENCOUNTER — Emergency Department
Admission: EM | Admit: 2022-03-03 | Discharge: 2022-03-03 | Disposition: A | Discharge status: Home / Self Care | Payer: BC Managed Care – PPO | Attending: Student in an Organized Health Care Education/Training Program | Admitting: Student in an Organized Health Care Education/Training Program

## 2022-03-03 ENCOUNTER — Emergency Department: Payer: BC Managed Care – PPO

## 2022-03-03 DIAGNOSIS — I509 Heart failure, unspecified: Secondary | ICD-10-CM | POA: Diagnosis not present

## 2022-03-03 DIAGNOSIS — R0789 Other chest pain: Secondary | ICD-10-CM | POA: Diagnosis present

## 2022-03-03 LAB — CBC
HCT: 46 % (ref 36.0–46.0)
Hemoglobin: 14.7 g/dL (ref 12.0–15.0)
MCH: 28 pg (ref 26.0–34.0)
MCHC: 32 g/dL (ref 30.0–36.0)
MCV: 87.6 fL (ref 80.0–100.0)
Platelets: 428 10*3/uL — ABNORMAL HIGH (ref 150–400)
RBC: 5.25 MIL/uL — ABNORMAL HIGH (ref 3.87–5.11)
RDW: 13.2 % (ref 11.5–15.5)
WBC: 8.1 10*3/uL (ref 4.0–10.5)
nRBC: 0 % (ref 0.0–0.2)

## 2022-03-03 LAB — BASIC METABOLIC PANEL
Anion gap: 6 (ref 5–15)
BUN: 7 mg/dL (ref 6–20)
CO2: 25 mmol/L (ref 22–32)
Calcium: 9.3 mg/dL (ref 8.9–10.3)
Chloride: 106 mmol/L (ref 98–111)
Creatinine, Ser: 0.82 mg/dL (ref 0.44–1.00)
GFR, Estimated: >60 mL/min (ref >60)
Glucose, Bld: 98 mg/dL (ref 70–99)
Potassium: 3.6 mmol/L (ref 3.5–5.1)
Sodium: 137 mmol/L (ref 135–145)

## 2022-03-03 LAB — TROPONIN I (HIGH SENSITIVITY)
Troponin I (High Sensitivity): 3 ng/L (ref <18)
Troponin I (High Sensitivity): 3 ng/L (ref <18)

## 2022-03-03 MED ORDER — OXYCODONE-ACETAMINOPHEN 5-325 MG PO TABS
1.0000 | ORAL_TABLET | Freq: Once | ORAL | Status: AC
  Administered 2022-03-03: 1 via ORAL
  Filled 2022-03-03: qty 1

## 2022-03-03 NOTE — ED Provider Notes (Signed)
? ?[facility] ?Provider Note ? ? ? Event Date/Time  ? First MD Initiated Contact with Patient 03/03/22 1420   ?  (approximate) ? ? ?History  ? ?Chest Pain ? ? ?HPI ? ?Sherry Watkins is a 41 y.o. female with a history of CHF but been compliant with her medication presents to the ER for evaluation of left-sided chest pain anterior left chest wall as well as behind her left shoulder.  It is worsened with movement.  Denies any shortness of breath no cough no fevers no chills no diaphoresis no nausea or vomiting.  Denies any trauma or specific injury.  Has never had pain like this before.  Denies any abdominal pain no diarrhea. ?  ? ? ?Physical Exam  ? ?Triage Vital Signs: ?ED Triage Vitals  ?Enc Vitals Group  ?   BP 03/03/22 1217 (!) 146/87  ?   Pulse Rate 03/03/22 1217 91  ?   Resp 03/03/22 1217 18  ?   Temp 03/03/22 1217 98.6 ?F (37 ?C)  ?   Temp Source 03/03/22 1217 Oral  ?   SpO2 03/03/22 1217 97 %  ?   Weight 03/03/22 1216 230 lb (104.3 kg)  ?   Height 03/03/22 1415 '5\' 2"'$  (1.575 m)  ?   Head Circumference --   ?   Peak Flow --   ?   Pain Score 03/03/22 1216 8  ?   Pain Loc --   ?   Pain Edu? --   ?   Excl. in [place]? --   ? ? ?Most recent vital signs: ?Vitals:  ? 03/03/22 1217 03/03/22 1501  ?BP: (!) 146/87 140/80  ?Pulse: 91 88  ?Resp: 18 18  ?Temp: 98.6 ?F (37 ?C)   ?SpO2: 97% 98%  ? ? ? ?Constitutional: Alert  ?Eyes: Conjunctivae are normal.  ?Head: Atraumatic. ?Nose: No congestion/rhinnorhea. ?Mouth/Throat: Mucous membranes are moist.   ?Neck: Painless ROM.  ?Cardiovascular:   Good peripheral circulation.  No murmurs gallops or rubs ?Respiratory: Normal respiratory effort.  No retractions.  Air movement throughout ?Gastrointestinal: Soft and nontender in all 4 quadrants ?Musculoskeletal:  no deformity.  Pain reproduced with palpation of the anterior left chest wall and left upper posterior shoulder no overlying warmth or erythema no effusion. ?Neurologic:  MAE spontaneously. No gross focal  neurologic deficits are appreciated.  ?Skin:  Skin is warm, dry and intact. No rash noted. ?Psychiatric: Mood and affect are normal. Speech and behavior are normal. ? ? ? ?ED Results / Procedures / Treatments  ? ?Labs ?(all labs ordered are listed, but only abnormal results are displayed) ?Labs Reviewed  ?CBC - Abnormal; Notable for the following components:  ?    Result Value  ? RBC 5.25 (*)   ? Platelets 428 (*)   ? All other components within normal limits  ?BASIC METABOLIC PANEL  ?TROPONIN I (HIGH SENSITIVITY)  ?TROPONIN I (HIGH SENSITIVITY)  ? ? ? ?EKG ? ?ED ECG REPORT ?I, Merlyn Lot, the attending physician, personally viewed and interpreted this ECG. ? ? Date: 03/03/2022 ? EKG Time: 12:12 ? Rate: 70 ? Rhythm: sinus ? Axis: right ? Intervals:normal ? ST&T Change: poor r wave progression, no stemi ? ? ? ?RADIOLOGY ?Please see ED Course for my review and interpretation. ? ?I personally reviewed all radiographic images ordered to evaluate for the above acute complaints and reviewed radiology reports and findings.  These findings were personally discussed with the patient.  Please see medical record  for radiology report. ? ? ? ?PROCEDURES: ? ?Critical Care performed:  ? ?Procedures ? ? ?MEDICATIONS ORDERED IN ED: ?Medications  ?oxyCODONE-acetaminophen (PERCOCET/ROXICET) 5-325 MG per tablet 1 tablet (1 tablet Oral Given 03/03/22 1442)  ? ? ? ?IMPRESSION / MDM / ASSESSMENT AND PLAN / ED COURSE  ?I reviewed the triage vital signs and the nursing notes. ?             ?               ? ?Differential diagnosis includes, but is not limited to, ACS, pericarditis, pe, dissection, pna, bronchitis, costochondritis ? ?Patient presenting to the ER for evaluation of chest pain as described above.  Seems very musculoskeletal is very reproducible on exam.  Her EKG is nonischemic her initial troponin is negative.  Is not clinically consistent with dissection she is clinically very well-appearing she is low risk by [place]  criteria and is PERC negative.  Will give pain medication will check for repeat troponin but if negative I think she will be appropriate for outpatient follow-up as her abdominal exam is soft and benign I doubt this is referred from the abdomen.  Patient is requesting 2 day a work note.   ? ? ? ?  ? ? ?FINAL CLINICAL IMPRESSION(S) / ED DIAGNOSES  ? ?Final diagnoses:  ?Chest wall pain  ? ? ? ?Rx / DC Orders  ? ?ED Discharge Orders   ? ? None  ? ?  ? ? ? ?Note:  This document was prepared using Dragon voice recognition software and may include unintentional dictation errors. ? ?  ?Merlyn Lot, MD ?03/03/22 1520 ? ?

## 2022-03-03 NOTE — ED Triage Notes (Signed)
Pt comes pov with chest pain since yesterday with radiation to back and left arm. Pt is on BP meds. Has CBHF but denies increase in swelling or shob.  ?

## 2022-03-07 ENCOUNTER — Encounter (HOSPITAL_COMMUNITY): Payer: Self-pay | Admitting: Emergency Medicine

## 2022-03-07 ENCOUNTER — Emergency Department (HOSPITAL_COMMUNITY): Payer: BC Managed Care – PPO

## 2022-03-07 ENCOUNTER — Other Ambulatory Visit: Payer: Self-pay

## 2022-03-07 ENCOUNTER — Emergency Department (HOSPITAL_COMMUNITY)
Admission: EM | Admit: 2022-03-07 | Discharge: 2022-03-08 | Disposition: A | Discharge status: Home / Self Care | Payer: BC Managed Care – PPO | Attending: Emergency Medicine | Admitting: Emergency Medicine

## 2022-03-07 DIAGNOSIS — I110 Hypertensive heart disease with heart failure: Secondary | ICD-10-CM | POA: Diagnosis not present

## 2022-03-07 DIAGNOSIS — Z79899 Other long term (current) drug therapy: Secondary | ICD-10-CM | POA: Insufficient documentation

## 2022-03-07 DIAGNOSIS — I509 Heart failure, unspecified: Secondary | ICD-10-CM | POA: Diagnosis not present

## 2022-03-07 DIAGNOSIS — R0600 Dyspnea, unspecified: Secondary | ICD-10-CM | POA: Insufficient documentation

## 2022-03-07 DIAGNOSIS — R079 Chest pain, unspecified: Secondary | ICD-10-CM

## 2022-03-07 LAB — CBC WITH DIFFERENTIAL/PLATELET
Abs Immature Granulocytes: 0.04 10*3/uL (ref 0.00–0.07)
Basophils Absolute: 0 10*3/uL (ref 0.0–0.1)
Basophils Relative: 0 %
Eosinophils Absolute: 0.2 10*3/uL (ref 0.0–0.5)
Eosinophils Relative: 2 %
HCT: 43.1 % (ref 36.0–46.0)
Hemoglobin: 13.9 g/dL (ref 12.0–15.0)
Immature Granulocytes: 0 %
Lymphocytes Relative: 34 %
Lymphs Abs: 3.6 10*3/uL (ref 0.7–4.0)
MCH: 28.5 pg (ref 26.0–34.0)
MCHC: 32.3 g/dL (ref 30.0–36.0)
MCV: 88.5 fL (ref 80.0–100.0)
Monocytes Absolute: 0.6 10*3/uL (ref 0.1–1.0)
Monocytes Relative: 6 %
Neutro Abs: 5.9 10*3/uL (ref 1.7–7.7)
Neutrophils Relative %: 58 %
Platelets: 411 10*3/uL — ABNORMAL HIGH (ref 150–400)
RBC: 4.87 MIL/uL (ref 3.87–5.11)
RDW: 13.3 % (ref 11.5–15.5)
WBC: 10.3 10*3/uL (ref 4.0–10.5)
nRBC: 0 % (ref 0.0–0.2)

## 2022-03-07 LAB — COMPREHENSIVE METABOLIC PANEL
ALT: 23 U/L (ref 0–44)
AST: 24 U/L (ref 15–41)
Albumin: 3.5 g/dL (ref 3.5–5.0)
Alkaline Phosphatase: 44 U/L (ref 38–126)
Anion gap: 10 (ref 5–15)
BUN: <5 mg/dL — ABNORMAL LOW (ref 6–20)
CO2: 23 mmol/L (ref 22–32)
Calcium: 9.1 mg/dL (ref 8.9–10.3)
Chloride: 104 mmol/L (ref 98–111)
Creatinine, Ser: 0.8 mg/dL (ref 0.44–1.00)
GFR, Estimated: >60 mL/min (ref >60)
Glucose, Bld: 88 mg/dL (ref 70–99)
Potassium: 3.4 mmol/L — ABNORMAL LOW (ref 3.5–5.1)
Sodium: 137 mmol/L (ref 135–145)
Total Bilirubin: 0.2 mg/dL — ABNORMAL LOW (ref 0.3–1.2)
Total Protein: 7.4 g/dL (ref 6.5–8.1)

## 2022-03-07 LAB — TROPONIN I (HIGH SENSITIVITY)
Troponin I (High Sensitivity): 4 ng/L (ref <18)
Troponin I (High Sensitivity): 3 ng/L (ref <18)

## 2022-03-07 LAB — I-STAT BETA HCG BLOOD, ED (MC, WL, AP ONLY): I-stat hCG, quantitative: <5 m[IU]/mL (ref <5)

## 2022-03-07 LAB — LIPASE, BLOOD: Lipase: 36 U/L (ref 11–51)

## 2022-03-07 NOTE — ED Provider Triage Note (Signed)
Emergency Medicine Provider Triage Evaluation Note ? ?Sherry Watkins , a 41 y.o. female  was evaluated in triage.  Pt complains of chest pain.  She reports that she has had chest pain since last Wednesday.  She denies any fevers or leg swelling.  Shortness of breath when going up stairs.  ? ? ?Physical Exam  ?BP (!) 156/91 (BP Location: Left Arm)   Pulse 75   Temp 98.4 ?F (36.9 ?C) (Oral)   Resp 18   LMP 05/13/2015 (Approximate)   SpO2 97%  ?Gen:   Awake, no distress   ?Resp:  Normal effort  ?MSK:   Moves extremities without difficulty  ?Other:  Normal speech.  RRR.  ? ?Medical Decision Making  ?Medically screening exam initiated at 5:20 PM.  Appropriate orders placed.  Sherry Watkins was informed that the remainder of the evaluation will be completed by another provider, this initial triage assessment does not replace that evaluation, and the importance of remaining in the ED until their evaluation is complete. ? ? ?  ?Sherry Watkins, [place] ?03/07/22 1725 ? ?

## 2022-03-07 NOTE — ED Triage Notes (Signed)
C/o L sided chest pain that radiates to back since 4/11.  Denies sob, nausea, and vomiting.  Seen at [facility] on 4/12 for same. ?

## 2022-03-08 ENCOUNTER — Encounter (HOSPITAL_COMMUNITY): Payer: Self-pay | Admitting: Student

## 2022-03-08 MED ORDER — ACETAMINOPHEN 325 MG PO TABS
650.0000 mg | ORAL_TABLET | Freq: Once | ORAL | Status: AC
  Administered 2022-03-08: 650 mg via ORAL
  Filled 2022-03-08: qty 2

## 2022-03-08 MED ORDER — LIDOCAINE 5 % EX PTCH: 1.0000 | MEDICATED_PATCH | Freq: Every day | CUTANEOUS | 0 refills | Status: DC | PRN

## 2022-03-08 MED ORDER — METHOCARBAMOL 500 MG PO TABS
500.0000 mg | ORAL_TABLET | Freq: Once | ORAL | Status: AC
Start: 2022-03-08 — End: 2022-03-08
  Administered 2022-03-08: 500 mg via ORAL
  Filled 2022-03-08: qty 1

## 2022-03-08 MED ORDER — KETOROLAC TROMETHAMINE 30 MG/ML IJ SOLN
15.0000 mg | Freq: Once | INTRAMUSCULAR | Status: AC
  Administered 2022-03-08: 15 mg via INTRAMUSCULAR
  Filled 2022-03-08: qty 1

## 2022-03-08 MED ORDER — METHOCARBAMOL 500 MG PO TABS: 500.0000 mg | ORAL_TABLET | Freq: Three times a day (TID) | ORAL | 0 refills | Status: DC | PRN

## 2022-03-08 MED ORDER — LIDOCAINE 5 % EX PTCH
2.0000 | MEDICATED_PATCH | CUTANEOUS | Status: DC
  Administered 2022-03-08: 2 via TRANSDERMAL
  Filled 2022-03-08: qty 2

## 2022-03-08 NOTE — Discharge Instructions (Addendum)
You were seen in the emergency department today for chest pain. Your work-up in the emergency department has been overall reassuring. Your labs have been fairly normal and or similar to previous blood work you have had done- your platelets were mildly elevated and your potassium is mildly low. Your EKG and the enzyme we use to check your heart did not show an acute heart attack at this time. Your chest x-ray was normal. We are sending you home with the following medicines to help with your pain:  ? ?- Robaxin- this is the muscle relaxer I have prescribed, this is meant to help with muscle tightness/spasms. Be aware that this medication may make you drowsy therefore the first time you take this it should be at a time you are in an environment where you can rest. Do not drive or operate heavy machinery when taking this medication. Do not drink alcohol or take other sedating medications with this medicine such as narcotics or benzodiazepines.  ? ?- Lidoderm patch- Apply 1 patch to your area of most significant pain once per day to help numb/soothe this area. Remove & discard patch within 12 hours of application. Do not put heat over this area.  ? ?You make take Tylenol per over the counter dosing with these medications.  ? ?We have prescribed you new medication(s) today. Discuss the medications prescribed today with your pharmacist as they can have adverse effects and interactions with your other medicines including over the counter and prescribed medications. Seek medical evaluation if you start to experience new or abnormal symptoms after taking one of these medicines, seek care immediately if you start to experience difficulty breathing, feeling of your throat closing, facial swelling, or rash as these could be indications of a more serious allergic reaction ? ? ?We would like you to follow up closely with your primary care provider and/or the cardiologist provided in your discharge instructions within 1-3 days.  Return to the ER immediately should you experience any new or worsening symptoms including but not limited to return of pain, worsened pain, vomiting, shortness of breath, dizziness, lightheadedness, passing out, or any other concerns that you may have.  ? ?

## 2022-03-08 NOTE — ED Provider Notes (Addendum)
?[facility] ?Provider Note ? ? ?CSN: [id number] ?Arrival date & time: 03/07/22  1637 ? ?  ? ?History ? ?Chief Complaint  ?Patient presents with  ? Chest Pain  ? ? ?Sherry Watkins is a 41 y.o. female with a history of hypertension, chronic pain syndrome, and HFpEF who presents to the ED with complaints of chest pain x 5 days. Patient reports fairly constant pain to the left chest/back, burning in the back, worse in certain positions, no alleviating factors. No intervention PTA. Seen @ [facility] ED for same and presented tonight due to persistent pain. Denies nausea, vomiting, diaphoresis, leg pain/swelling, hemoptysis, recent surgery/trauma, recent long travel, hormone use, personal hx of cancer, or hx of DVT/PE. Patient does report dyspnea when going up the stares which has been occurring since she was diagnosed with HF, no acute change.  ? ?HPI ? ?  ? ?Home Medications ?Prior to Admission medications   ?Medication Sig Start Date End Date Taking? Authorizing Provider  ?acetaminophen (TYLENOL) 500 MG tablet Take 500 mg by mouth every 6 (six) hours as needed.    [provider]  ?benzonatate (TESSALON PERLES) 100 MG capsule Take 1 capsule (100 mg total) by mouth 3 (three) times daily as needed for cough. 10/12/21 10/12/22  Cuthriell, Charline Bills, PA-C  ?brompheniramine-pseudoephedrine-DM 30-2-10 MG/5ML syrup Take 10 mLs by mouth 4 (four) times daily as needed. 10/12/21   Cuthriell, Charline Bills, PA-C  ?famotidine (PEPCID) 20 MG tablet Take 20 mg by mouth daily. ?Patient not taking: Reported on 08/03/2021    [provider]  ?fluticasone (FLONASE) 50 MCG/ACT nasal spray Place 1 spray into both nostrils 2 (two) times daily. 10/12/21   Cuthriell, Charline Bills, PA-C  ?furosemide (LASIX) 20 MG tablet Take 1 tablet (20 mg total) by mouth daily. 04/09/21 04/09/22  Alisa Graff, FNP  ?losartan (COZAAR) 100 MG tablet Take 1 tablet (100 mg total) by mouth daily. 08/03/21    Marrianne Mood D, PA-C  ?Melatonin 10 MG CAPS Take 20 mg by mouth at bedtime.    [provider]  ?metoprolol succinate (TOPROL-XL) 25 MG 24 hr tablet Take 1 tablet (25 mg total) by mouth daily. 08/03/21 10/02/21  Marrianne Mood D, PA-C  ?potassium chloride (KLOR-CON) 10 MEQ tablet Take 10 mEq by mouth 2 (two) times daily.    [provider]  ?   ? ?Allergies    ?Patient has no known allergies.   ? ?Review of Systems   ?Review of Systems  ?Constitutional:  Negative for chills, diaphoresis and fever.  ?Respiratory:  Positive for shortness of breath (w/ steps, no acute change).   ?Cardiovascular:  Positive for chest pain. Negative for leg swelling.  ?Gastrointestinal:  Negative for abdominal pain, nausea and vomiting.  ?Neurological:  Negative for syncope.  ?All other systems reviewed and are negative. ? ?Physical Exam ?Updated Vital Signs ?BP (!) 144/93 (BP Location: Right Arm)   Pulse 76   Temp 97.9 ?F (36.6 ?C)   Resp 18   LMP 05/13/2015 (Approximate)   SpO2 100%  ?Physical Exam ?Vitals and nursing note reviewed.  ?Constitutional:   ?   General: She is not in acute distress. ?   Appearance: She is well-developed. She is not toxic-appearing.  ?HENT:  ?   Head: Normocephalic and atraumatic.  ?Eyes:  ?   General:     ?   Right eye: No discharge.     ?   Left eye: No discharge.  ?  Conjunctiva/sclera: Conjunctivae normal.  ?Cardiovascular:  ?   Rate and Rhythm: Normal rate and regular rhythm.  ?   Pulses:     ?     Radial pulses are 2+ on the right side and 2+ on the left side.  ?     Posterior tibial pulses are 2+ on the right side and 2+ on the left side.  ?Pulmonary:  ?   Effort: No respiratory distress.  ?   Breath sounds: Normal breath sounds. No wheezing or rales.  ?Chest:  ?   Chest wall: Tenderness (left anterior and posterior chest wall without overlying rashes or skin changes) present.  ?Abdominal:  ?   General: There is no distension.  ?   Palpations: Abdomen is soft.  ?    Tenderness: There is no abdominal tenderness.  ?Musculoskeletal:  ?   Cervical back: Neck supple.  ?   Right lower leg: No tenderness. No edema.  ?   Left lower leg: No tenderness. No edema.  ?Skin: ?   General: Skin is warm and dry.  ?Neurological:  ?   Mental Status: She is alert.  ?   Comments: Sensation grossly intact to upper extremities. 5/5 symmetric grip strength. Clear speech.   ?Psychiatric:     ?   Behavior: Behavior normal.  ? ? ?ED Results / Procedures / Treatments   ?Labs ?(all labs ordered are listed, but only abnormal results are displayed) ?Labs Reviewed  ?CBC WITH DIFFERENTIAL/PLATELET - Abnormal; Notable for the following components:  ?    Result Value  ? Platelets 411 (*)   ? All other components within normal limits  ?COMPREHENSIVE METABOLIC PANEL - Abnormal; Notable for the following components:  ? Potassium 3.4 (*)   ? BUN <5 (*)   ? Total Bilirubin 0.2 (*)   ? All other components within normal limits  ?LIPASE, BLOOD  ?I-STAT BETA HCG BLOOD, ED ([facility], [facility], [facility] ONLY)  ?TROPONIN I (HIGH SENSITIVITY)  ?TROPONIN I (HIGH SENSITIVITY)  ? ? ?EKG ?None ? ?Radiology ?DG Chest 2 View ? ?Result Date: 03/07/2022 ?CLINICAL DATA:  chest pain EXAM: CHEST - 2 VIEW COMPARISON:  Chest x-ray 03/03/2022, CT chest 02/27/2020. FINDINGS: The heart and mediastinal contours are within normal limits. No focal consolidation. No pulmonary edema. No pleural effusion. No pneumothorax. No acute osseous abnormality. IMPRESSION: No active cardiopulmonary disease. Electronically Signed   By: Iven Finn M.D.   On: 03/07/2022 17:44   ? ?Procedures ?Procedures  ? ? ?Medications Ordered in ED ?Medications - No data to display ? ?ED Course/ Medical Decision Making/ A&P ?  ?                        ?Medical Decision Making ?Amount and/or Complexity of Data Reviewed ?Radiology: ordered. ? ?Risk ?OTC drugs. ?Prescription drug management. ? ? ?Patient presents to the emergency department with chest pain. Patient nontoxic appearing, in  no apparent distress, vitals without significant abnormality, initial BP elevated- improved. Fairly benign physical exam.  ? ?DDX including but not limited to: ACS, pulmonary embolism, dissection, pneumothorax, pneumonia, arrhythmia, severe anemia, acute chf, MSK, GERD, anxiety, abdominal process.  ? ?Additional history obtained:  ?Chart & nursing note reviewed.  ?External records reviewed including most recent outpatient cardiology note.  ?Echo ?04/16/2021 ? 1. Left ventricular ejection fraction, by estimation, is 60 to 65%. The  ?left ventricle has normal function. The left ventricle has no regional  ?wall motion abnormalities. Left ventricular diastolic  parameters are  ?consistent with Grade I diastolic  ?dysfunction (impaired relaxation). The average left ventricular global  ?longitudinal strain is -17.9 %. The global longitudinal strain is normal.  ? 2. Right ventricular systolic function is normal. The right ventricular  ?size is normal. Tricuspid regurgitation signal is inadequate for assessing  ?PA pressure.  ? ?EKG: no STEMI ? ?Lab Tests:  ?I reviewed & interpreted labs including:  ?CBC: mildly elevated plt.  ?CMP: mild hypokalemia- no critical electrolyte derangement.  ?Lipase: WNL ?Troponins: WNL ?Preg test: Negative ? ?Imaging Studies ordered:  ?I  viewed the following imaging, agree with radiologist impression:  ?No active cardiopulmonary disease.  ? ?ED Course:  ? ?Heart Pathway Score low risk- EKG without obvious acute ischemia, delta troponin negative, doubt ACS. Patient is low risk wells, PERC negative, low suspicion for pulmonary embolism. Pain is not a tearing sensation, symmetric pulses, no widening of mediastinum on CXR, doubt dissection. Abdomen nontender without peritoneal signs. Reassuring labs & imaging. Given reproducible with palpation possibly MSK- supportive care, follow up with her cardiologist & PCP. I ordered supportive care medicines for pain, patient feeling improved and requesting  discharge.  ? ?Based on patient's chief complaint, I considered admission might be necessary, however after reassuring ED workup feel patient is reasonable for discharge.  ? ?I discussed results, treatment plan, need

## 2022-03-29 NOTE — Progress Notes (Signed)
? ?Cardiology Office Note   ? ?Date:  03/30/2022  ? ?ID:  Sherry Watkins, DOB December 14, 1980, MRN [id number] ? ?PCP:  Sherry, Watkins  ?Cardiologist:  Nelva Bush, MD  ?Electrophysiologist:  None  ? ?Chief Complaint: ED follow-up ? ?History of Present Illness:  ? ?Sherry Watkins is a 41 y.o. female with history of diastolic dysfunction, HTN, endometriosis, GERD, and obesity who presents for ED follow-up. ? ?Over the years, she has had multiple ED visits for varying ailments including atypical chest pain.  In total, she has had 19 negative troponins drawn since 06/2019.  Echo in 03/2021 demonstrated an EF of 60 to 65%, no regional wall motion abnormalities, grade 1 diastolic dysfunction, normal RV systolic function and ventricular cavity size, and no evidence of significant valvular abnormalities.  ETT in 04/2021 was low risk and without evidence of ischemia with hypertensive BP response noted. ? ?She was seen in the ED on 03/03/2022 with chest pain that was worsened with movement.  High-sensitivity troponin negative x2.  EKG showed sinus rhythm with poor R wave progression.  Chest x-ray showed no acute abnormality.  She was most recently seen in the ED on 03/07/2022 with chest/back pain x5 days that was worse in certain positions.  High-sensitivity troponin negative x2.  EKG showed sinus rhythm with improved R wave progression.  Chest x-ray showed no acute abnormalities.  No indicates left anterior and posterior chest wall was tender to palpation.  She was discharged with Lidoderm and Robaxin. ? ?She comes in today noting an approximate year-long history of intermittent substernal chest pressure that is randomly occurring and will last for a week at a time sometimes.  She also notes a several week history of left shoulder and upper back "burning."  She does note the Robaxin that was prescribed in the ED has helped with this upper back discomfort.  Symptoms are nonexertional and not exacerbated by  rotational movements.  She also notes a 1 to 2-week history of bilateral pedal edema that is not dependent in nature.  No significant changes to her diet.  She has tried to get more exercise lately.  No dizziness, presyncope, or syncope.  She also reports a history of snoring with no prior sleep study.  She reports stable two-pillow orthopnea in the setting of GERD.  She is currently chest pain-free. ? ? ?Labs independently reviewed: ?02/2022 - potassium 3.4, BUN less than 5, serum creatinine 0.8, albumin 3.5, AST/ALT normal, Hgb 13.9, PLT 411 ?12/2017 - magnesium 1.8 ? ?Past Medical History:  ?Diagnosis Date  ? Complication of anesthesia   ? Depression   ? Endometriosis   ? GERD (gastroesophageal reflux disease)   ? NO MEDS  ? Headache   ? FREQUENTLY  ? Hypertension   ? Obesity   ? Ovarian cyst   ? ? ?Past Surgical History:  ?Procedure Laterality Date  ? ABLATION    ? CESAREAN SECTION    ? CYSTOSCOPY N/A 08/24/2018  ? Procedure: CYSTOSCOPY;  Surgeon: Will Bonnet, MD;  Location: [facility] ORS;  Service: Gynecology;  Laterality: N/A;  ? HYSTEROSCOPY WITH NOVASURE N/A 05/27/2015  ? Procedure: HYSTEROSCOPY WITH NOVASURE;  Surgeon: Aletha Halim, MD;  Location: [facility] ORS;  Service: Gynecology;  Laterality: N/A;  ? LAPAROSCOPY N/A 07/07/2017  ? Procedure: LAPAROSCOPY DIAGNOSTIC;  Surgeon: Malachy Mood, MD;  Location: [facility] ORS;  Service: Gynecology;  Laterality: N/A;  ? PARTIAL HYSTERECTOMY    ? ROBOTIC ASSISTED TOTAL HYSTERECTOMY Bilateral 08/24/2018  ?  Procedure: ROBOTIC ASSISTED TOTAL HYSTERECTOMY BILATERAL SALPINGECTOMY;  Surgeon: Will Bonnet, MD;  Location: [facility] ORS;  Service: Gynecology;  Laterality: Bilateral;  ? TUBAL LIGATION    ? ? ?Current Medications: ?Current Meds  ?Medication Sig  ? acetaminophen (TYLENOL) 500 MG tablet Take 1,000 mg by mouth every 6 (six) hours as needed for moderate pain or headache.  ? lidocaine (LIDODERM) 5 % Place 1 patch onto the skin daily as needed. Apply patch to area most  significant pain once per day.  Remove and discard patch within 12 hours of application.  ? losartan (COZAAR) 100 MG tablet Take 1 tablet (100 mg total) by mouth daily.  ? methocarbamol (ROBAXIN) 500 MG tablet Take 1 tablet (500 mg total) by mouth every 8 (eight) hours as needed for muscle spasms.  ? metoprolol tartrate (LOPRESSOR) 100 MG tablet Take 1 tablet (100 mg total) by mouth as directed. TAKE 2 HOURS BEFORE YOUR TEST  ? ? ?Allergies:   Patient has no known allergies.  ? ?Social History  ? ?Socioeconomic History  ? Marital status: Single  ?  Spouse name: Not on file  ? Number of children: Not on file  ? Years of education: Not on file  ? Highest education level: Not on file  ?Occupational History  ? Not on file  ?Tobacco Use  ? Smoking status: Former  ?  Years: 15.00  ?  Types: Cigarettes  ?  Quit date: 06/30/2004  ?  Years since quitting: 17.7  ? Smokeless tobacco: Never  ?Vaping Use  ? Vaping Use: Never used  ?Substance and Sexual Activity  ? Alcohol use: Not Currently  ?  Comment: wine occasionally  ? Drug use: No  ? Sexual activity: Yes  ?  Birth control/protection: None  ?Other Topics Concern  ? Not on file  ?Social History Narrative  ? Not on file  ? ?Social Determinants of Health  ? ?Financial Resource Strain: Not on file  ?Food Insecurity: Not on file  ?Transportation Needs: Not on file  ?Physical Activity: Not on file  ?Stress: Not on file  ?Social Connections: Not on file  ?  ? ?Family History:  ?The patient's family history includes Cancer in her father; Diabetes in her mother; Heart attack in her father; Heart disease in her brother; Heart failure in her father; Hypertension in her mother; Kidney failure in her mother. ? ?ROS:   ?12-point review of systems is negative unless otherwise noted in the HPI. ? ? ?EKGs/Labs/Other Studies Reviewed:   ? ?Studies reviewed were summarized above. The additional studies were reviewed today: ? ?ETT 04/30/2021: ?Baseline EKG demonstrates normal sinus rhythm. ?The  patient exhibited fair exercise capacity with a hypertensive blood pressure response. No angina was reported. ?No significant ST segment or T wave changes were observed during stress or recovery. ?No significant arrhythmia occurred. ?Low risk exercise tolerance test (Duke Treadmill Score = +6). ?  ?Low risk exercise tolerance test without evidence of ischemia at workload achieved. Hypertensive blood pressure response noted. ?__________ ? ?2D echo 04/16/2021: ?1. Left ventricular ejection fraction, by estimation, is 60 to 65%. The  ?left ventricle has normal function. The left ventricle has no regional  ?wall motion abnormalities. Left ventricular diastolic parameters are  ?consistent with Grade I diastolic  ?dysfunction (impaired relaxation). The average left ventricular global  ?longitudinal strain is -17.9 %. The global longitudinal strain is normal.  ? 2. Right ventricular systolic function is normal. The right ventricular  ?size is normal. Tricuspid regurgitation  signal is inadequate for assessing  ?PA pressure. ? ? ?EKG:  EKG is ordered today.  The EKG ordered today demonstrates NSR, 68 bpm, no acute ST-T changes ? ?Recent Labs: ?03/07/2022: ALT 23; BUN <5; Creatinine, Ser 0.80; Hemoglobin 13.9; Platelets 411; Potassium 3.4; Sodium 137  ?Recent Lipid Panel ?No results found for: CHOL, TRIG, HDL, CHOLHDL, VLDL, LDLCALC, LDLDIRECT ? ?PHYSICAL EXAM:   ? ?VS:  BP 130/90 (BP Location: Left Arm, Patient Position: Sitting, Cuff Size: Large)   Pulse 68   Ht '5\' 2"'$  (1.575 m)   Wt 240 lb (108.9 kg)   LMP 05/13/2015 (Approximate)   SpO2 98%   BMI 43.90 kg/m?   BMI: Body mass index is 43.9 kg/m?. ? ?Physical Exam ?Vitals reviewed.  ?Constitutional:   ?   Appearance: She is well-developed.  ?HENT:  ?   Head: Normocephalic and atraumatic.  ?Eyes:  ?   General:     ?   Right eye: No discharge.     ?   Left eye: No discharge.  ?Neck:  ?   Vascular: No JVD.  ?Cardiovascular:  ?   Rate and Rhythm: Normal rate and regular  rhythm.  ?   Pulses:     ?     Dorsalis pedis pulses are 2+ on the right side and 2+ on the left side.  ?     Posterior tibial pulses are 2+ on the right side and 2+ on the left side.  ?   Heart sounds: Normal heart

## 2022-03-30 ENCOUNTER — Encounter: Payer: Self-pay | Admitting: Physician Assistant

## 2022-03-30 ENCOUNTER — Ambulatory Visit (INDEPENDENT_AMBULATORY_CARE_PROVIDER_SITE_OTHER): Payer: Self-pay | Admitting: Physician Assistant

## 2022-03-30 VITALS — BP 130/90 | HR 68 | Ht 62.0 in | Wt 240.0 lb

## 2022-03-30 DIAGNOSIS — I5189 Other ill-defined heart diseases: Secondary | ICD-10-CM

## 2022-03-30 DIAGNOSIS — R600 Localized edema: Secondary | ICD-10-CM

## 2022-03-30 DIAGNOSIS — R072 Precordial pain: Secondary | ICD-10-CM

## 2022-03-30 DIAGNOSIS — I10 Essential (primary) hypertension: Secondary | ICD-10-CM

## 2022-03-30 DIAGNOSIS — G4730 Sleep apnea, unspecified: Secondary | ICD-10-CM

## 2022-03-30 MED ORDER — METOPROLOL TARTRATE 100 MG PO TABS: 100.0000 mg | ORAL_TABLET | ORAL | 0 refills | Status: DC

## 2022-03-30 NOTE — Patient Instructions (Addendum)
Medication Instructions:  ?No changes at this time.  ? ?*If you need a refill on your cardiac medications before your next appointment, please call your pharmacy* ? ? ?Lab Work: ?None ? ?If you have labs (blood work) drawn today and your tests are completely normal, you will receive your results only by: ?MyChart Message (if you have MyChart) OR ?A paper copy in the mail ?If you have any lab test that is abnormal or we need to change your treatment, we will call you to review the results. ? ? ?Testing/Procedures: ?Your physician has requested that you have an echocardiogram. Echocardiography is a painless test that uses sound waves to create images of your heart. It provides your doctor with information about the size and shape of your heart and how well your heart?s chambers and valves are working. This procedure takes approximately one hour. There are no restrictions for this procedure. ? ? ? ?Your cardiac CT is scheduled at one the below location:  ? ? ?[place] ?[address] ?[address] ?[place], [place] [address] ?[phone] ? ?SCHEDULED FOR 04/01/22 at 10:00 am. Please arrive at 09:45 am for check-in and test prep.  ? ? ?Please follow these instructions carefully (unless otherwise directed): ? ?Hold all erectile dysfunction medications at least 3 days (72 hrs) prior to test. ? ?On the Night Before the Test: ?Be sure to Drink plenty of water. ?Do not consume any caffeinated/decaffeinated beverages or chocolate 12 hours prior to your test. ?Do not take any antihistamines 12 hours prior to your test. ? ? ?On the Day of the Test: ?Drink plenty of water until 1 hour prior to the test. ?Do not eat any food 4 hours prior to the test. ?You may take your regular medications prior to the test.  ?Take metoprolol (Lopressor) 100 mg two hours prior to test. ?FEMALES- please wear underwire-free bra if available, avoid dresses & tight clothing ? ? ? ?     ?After the Test: ?Drink plenty  of water. ?After receiving IV contrast, you may experience a mild flushed feeling. This is normal. ?On occasion, you may experience a mild rash up to 24 hours after the test. This is not dangerous. If this occurs, you can take Benadryl 25 mg and increase your fluid intake. ?If you experience trouble breathing, this can be serious. If it is severe call 911 IMMEDIATELY. If it is mild, please call our office. ?If you take any of these medications: Glipizide/Metformin, Avandament, Glucavance, please do not take 48 hours after completing test unless otherwise instructed. ? ?We will call to schedule your test 2-4 weeks out understanding that some insurance companies will need an authorization prior to the service being performed.  ? ?For non-scheduling related questions, please contact the cardiac imaging nurse navigator should you have any questions/concerns: ?Marchia Bond, Cardiac Imaging Nurse Navigator ?Gordy Clement, Cardiac Imaging Nurse Navigator ?[facility] ?Direct Office Dial: [phone]  ? ?For scheduling needs, including cancellations and rescheduling, please call [place], [phone]. ? ? ? ?Follow-Up: ?At [facility], you and your health needs are our priority.  As part of our continuing mission to provide you with exceptional heart care, we have created designated Provider Care Teams.  These Care Teams include your primary Cardiologist (physician) and Advanced Practice Providers (APPs -  Physician Assistants and Nurse Practitioners) who all work together to provide you with the care you need, when you need it. ? ? ?Your next appointment:   ?2  month(s) ? ?The format for your next appointment:   ?In Person ? ?Provider:   ?Nelva Bush, MD or Christell Faith, PA-C  ? ? ? ? ?Important Information About Sugar ? ? ? ? ?  ?

## 2022-03-31 ENCOUNTER — Telehealth (HOSPITAL_COMMUNITY): Payer: Self-pay | Admitting: Emergency Medicine

## 2022-03-31 NOTE — Telephone Encounter (Signed)
Attempted to call patient regarding upcoming cardiac CT appointment. °Left message on voicemail with name and callback number °Sanaiyah Kirchhoff RN Navigator Cardiac Imaging ° [facility] °[phone] Office °[phone] Cell ° °

## 2022-04-01 ENCOUNTER — Ambulatory Visit
Admission: RE | Admit: 2022-04-01 | Discharge: 2022-04-01 | Disposition: A | Discharge status: Home / Self Care | Payer: Medicaid Other | Source: Ambulatory Visit | Attending: Physician Assistant | Admitting: Physician Assistant

## 2022-04-01 ENCOUNTER — Other Ambulatory Visit: Payer: Self-pay

## 2022-04-01 DIAGNOSIS — R072 Precordial pain: Secondary | ICD-10-CM | POA: Insufficient documentation

## 2022-04-01 MED ORDER — METOPROLOL TARTRATE 5 MG/5ML IV SOLN
10.0000 mg | Freq: Once | INTRAVENOUS | Status: AC
  Administered 2022-04-01: 10 mg via INTRAVENOUS

## 2022-04-01 MED ORDER — DILTIAZEM HCL 25 MG/5ML IV SOLN
10.0000 mg | Freq: Once | INTRAVENOUS | Status: AC
  Administered 2022-04-01: 10 mg via INTRAVENOUS

## 2022-04-01 MED ORDER — IOHEXOL 350 MG/ML SOLN
125.0000 mL | Freq: Once | INTRAVENOUS | Status: AC | PRN
  Administered 2022-04-01: 125 mL via INTRAVENOUS

## 2022-04-01 MED ORDER — NITROGLYCERIN 0.4 MG SL SUBL
0.8000 mg | SUBLINGUAL_TABLET | Freq: Once | SUBLINGUAL | Status: AC
  Administered 2022-04-01: 0.8 mg via SUBLINGUAL

## 2022-04-01 NOTE — Progress Notes (Signed)
Patient tunable to complete study. Anxious with increased HR when moved into CT scanner. Dr. Mylo Red aware. Ambulate w/o difficulty. Denies light headedness or being dizzy. Sitting in chair drinking water provided. All questions answered. ABC intact. No further needs. Discharge from procedure area w/o issues.   ?

## 2022-04-02 ENCOUNTER — Other Ambulatory Visit: Payer: Self-pay | Admitting: *Deleted

## 2022-04-02 MED ORDER — LOSARTAN POTASSIUM 100 MG PO TABS: 100.0000 mg | ORAL_TABLET | Freq: Every day | ORAL | 0 refills | Status: DC

## 2022-04-05 ENCOUNTER — Other Ambulatory Visit (HOSPITAL_COMMUNITY): Payer: Self-pay | Admitting: *Deleted

## 2022-04-05 MED ORDER — METOPROLOL TARTRATE 100 MG PO TABS: 100.0000 mg | ORAL_TABLET | ORAL | 0 refills | Status: DC

## 2022-04-05 MED ORDER — IVABRADINE HCL 7.5 MG PO TABS: ORAL_TABLET | ORAL | 0 refills | Status: DC

## 2022-04-07 ENCOUNTER — Telehealth (HOSPITAL_COMMUNITY): Payer: Self-pay | Admitting: *Deleted

## 2022-04-07 NOTE — Telephone Encounter (Signed)
Reaching out to patient to offer assistance regarding upcoming cardiac imaging study; pt verbalizes understanding of appt date/time, parking situation and where to check in, pre-test NPO status and medications ordered, and verified current allergies; name and call back number provided for further questions should they arise  Hilman Kissling RN Navigator Cardiac Imaging [facility] [phone] office [phone] cell  Patient to take 100mg metoprolol tartrate + 15mg ivabradine two hours prior to her cardiac CT scan. 

## 2022-04-08 ENCOUNTER — Ambulatory Visit
Admission: RE | Admit: 2022-04-08 | Discharge: 2022-04-08 | Disposition: A | Discharge status: Home / Self Care | Payer: Self-pay | Source: Ambulatory Visit | Attending: Physician Assistant | Admitting: Physician Assistant

## 2022-04-14 ENCOUNTER — Telehealth (HOSPITAL_COMMUNITY): Payer: Self-pay | Admitting: Emergency Medicine

## 2022-04-14 NOTE — Telephone Encounter (Signed)
Reaching out to patient to offer assistance regarding upcoming cardiac imaging study; pt verbalizes understanding of appt date/time, parking situation and where to check in, pre-test NPO status and medications ordered, and verified current allergies; name and call back number provided for further questions should they arise Marchia Bond RN Navigator Cardiac Imaging [facility] [phone] office [phone] cell  States she might have to call and cancel the appt due to needing gas money to get to the appt

## 2022-04-15 ENCOUNTER — Ambulatory Visit
Admission: RE | Admit: 2022-04-15 | Discharge: 2022-04-15 | Disposition: A | Discharge status: Home / Self Care | Payer: Self-pay | Source: Ambulatory Visit | Attending: Physician Assistant | Admitting: Physician Assistant

## 2022-04-15 DIAGNOSIS — R072 Precordial pain: Secondary | ICD-10-CM | POA: Insufficient documentation

## 2022-04-15 MED ORDER — NITROGLYCERIN 0.4 MG SL SUBL
0.4000 mg | SUBLINGUAL_TABLET | Freq: Once | SUBLINGUAL | Status: AC
  Administered 2022-04-15: 0.4 mg via SUBLINGUAL

## 2022-04-15 MED ORDER — METOPROLOL TARTRATE 5 MG/5ML IV SOLN
10.0000 mg | Freq: Once | INTRAVENOUS | Status: AC
  Administered 2022-04-15: 10 mg via INTRAVENOUS

## 2022-04-15 MED ORDER — METOPROLOL TARTRATE 5 MG/5ML IV SOLN: 10.0000 mg | Freq: Once | INTRAVENOUS | Status: DC

## 2022-04-15 MED ORDER — IOHEXOL 350 MG/ML SOLN
100.0000 mL | Freq: Once | INTRAVENOUS | Status: AC | PRN
  Administered 2022-04-15: 100 mL via INTRAVENOUS

## 2022-04-15 MED ORDER — NITROGLYCERIN 0.4 MG SL SUBL
0.8000 mg | SUBLINGUAL_TABLET | Freq: Once | SUBLINGUAL | Status: AC
  Administered 2022-04-15: 0.8 mg via SUBLINGUAL

## 2022-04-15 MED ORDER — DILTIAZEM HCL 25 MG/5ML IV SOLN
10.0000 mg | Freq: Once | INTRAVENOUS | Status: AC
  Administered 2022-04-15: 10 mg via INTRAVENOUS

## 2022-04-15 MED ORDER — NITROGLYCERIN 0.4 MG SL SUBL: 0.4000 mg | SUBLINGUAL_TABLET | SUBLINGUAL | Status: DC | PRN

## 2022-04-15 NOTE — Progress Notes (Signed)
Patient tolerated CT well. Drank water after. Vital signs stable encourage to drink water throughout day.Reasons explained and verbalized understanding. Ambulated steady gait.  

## 2022-04-22 ENCOUNTER — Other Ambulatory Visit: Payer: Self-pay

## 2022-04-28 ENCOUNTER — Other Ambulatory Visit: Payer: Self-pay

## 2022-05-28 NOTE — Progress Notes (Deleted)
Cardiology Office Note    Date:  05/28/2022   ID:  Ger, Nicks [birth year], MRN [id number]  PCP:  [facility]  Cardiologist:  Nelva Bush, MD  Electrophysiologist:  None   Chief Complaint: Follow-up  History of Present Illness:   Sherry Watkins is a 41 y.o. female with history of diastolic dysfunction, HTN, endometriosis, GERD, and obesity who presents for follow-up of coronary CTA.   Over the years, she has had multiple ED visits for varying ailments including atypical chest pain.  In total, she has had 19 negative troponins drawn since 06/2019.  Echo in 03/2021 demonstrated an EF of 60 to 65%, no regional wall motion abnormalities, grade 1 diastolic dysfunction, normal RV systolic function and ventricular cavity size, and no evidence of significant valvular abnormalities.  ETT in 04/2021 was low risk and without evidence of ischemia with hypertensive BP response noted.   She was seen in the ED on 03/03/2022 with chest pain that was worsened with movement.  High-sensitivity troponin negative x2.  EKG showed sinus rhythm with poor R wave progression.  Chest x-ray showed no acute abnormality.  She was most recently seen in the ED on 03/07/2022 with chest/back pain x5 days that was worse in certain positions.  High-sensitivity troponin negative x2.  EKG showed sinus rhythm with improved R wave progression.  Chest x-ray showed no acute abnormalities.  No indicates left anterior and posterior chest wall was tender to palpation.  She was discharged with Lidoderm and Robaxin.  She was last seen in the office on 03/30/2022 noting an approximate year-long history of intermittent chest pressure that was randomly occurring and would last for a week at times.  She noted the previously prescribed Robaxin had helped with her upper back discomfort.  She also noted a 1 to 2-week history of bilateral pedal edema.  Coronary CTA on 04/15/2022 showed a calcium score of 0 with no  evidence of CAD.  Noncardiac overread showed a moderate-sized hiatal hernia.  Echo is scheduled for 06/09/2022.  ***   Labs independently reviewed: 02/2022 - potassium 3.4, BUN less than 5, serum creatinine 0.8, albumin 3.5, AST/ALT normal, Hgb 13.9, PLT 411 12/2017 - magnesium 1.8  Past Medical History:  Diagnosis Date   Complication of anesthesia    Depression    Endometriosis    GERD (gastroesophageal reflux disease)    NO MEDS   Headache    FREQUENTLY   Hypertension    Obesity    Ovarian cyst     Past Surgical History:  Procedure Laterality Date   ABLATION     CESAREAN SECTION     CYSTOSCOPY N/A 08/24/2018   Procedure: CYSTOSCOPY;  Surgeon: Will Bonnet, MD;  Location: [facility];  Service: Gynecology;  Laterality: N/A;   HYSTEROSCOPY WITH NOVASURE N/A 05/27/2015   Procedure: HYSTEROSCOPY WITH NOVASURE;  Surgeon: Aletha Halim, MD;  Location: [facility];  Service: Gynecology;  Laterality: N/A;   LAPAROSCOPY N/A 07/07/2017   Procedure: LAPAROSCOPY DIAGNOSTIC;  Surgeon: Malachy Mood, MD;  Location: [facility];  Service: Gynecology;  Laterality: N/A;   PARTIAL HYSTERECTOMY     ROBOTIC ASSISTED TOTAL HYSTERECTOMY Bilateral 08/24/2018   Procedure: ROBOTIC ASSISTED TOTAL HYSTERECTOMY BILATERAL SALPINGECTOMY;  Surgeon: Will Bonnet, MD;  Location: [facility];  Service: Gynecology;  Laterality: Bilateral;   TUBAL LIGATION      Current Medications: No outpatient medications have been marked as taking for the 05/31/22 encounter (Appointment) with Rise Mu,  PA-C.    Allergies:   Patient has no known allergies.   Social History   Socioeconomic History   Marital status: Single    Spouse name: Not on file   Number of children: Not on file   Years of education: Not on file   Highest education level: Not on file  Occupational History   Not on file  Tobacco Use   Smoking status: Former    Years: 15.00    Types: Cigarettes    Quit date: 06/30/2004    Years since  quitting: 17.9   Smokeless tobacco: Never  Vaping Use   Vaping Use: Never used  Substance and Sexual Activity   Alcohol use: Not Currently    Comment: wine occasionally   Drug use: No   Sexual activity: Yes    Birth control/protection: None  Other Topics Concern   Not on file  Social History Narrative   Not on file   Social Determinants of Health   Financial Resource Strain: Not on file  Food Insecurity: Not on file  Transportation Needs: Not on file  Physical Activity: Not on file  Stress: Not on file  Social Connections: Not on file     Family History:  The patient's family history includes Cancer in her father; Diabetes in her mother; Heart attack in her father; Heart disease in her brother; Heart failure in her father; Hypertension in her mother; Kidney failure in her mother.  ROS:   ROS   EKGs/Labs/Other Studies Reviewed:    Studies reviewed were summarized above. The additional studies were reviewed today:  Coronary CTA 04/15/2022: Noncardiac overread -  IMPRESSION: Moderate size hiatal hernia. No acute extracardiac findings in the chest.  Cardiac overread -  FINDINGS: Aorta:  Normal size.  No calcifications.  No dissection.   Aortic Valve:  Trileaflet.  No calcifications.   Coronary Arteries:  Normal coronary origin.  Right dominance.   RCA is a dominant artery that gives rise to PDA and PLA. There is no plaque.   Left main gives rise to LAD and LCX arteries. There is no LM disease.   LAD has no plaque.   LCX is a non-dominant artery that gives rise to two obtuse marginal branches. There is no plaque.   Other findings:   Normal pulmonary vein drainage into the left atrium.   Normal left atrial appendage without a thrombus.   Normal size of the pulmonary artery.   IMPRESSION: 1. Coronary calcium score of 0. Patient is low risk for coronary events. 2. Normal coronary origin with right dominance. 3. No evidence of CAD. 4. CAD-RADS 0.  Consider non-atherosclerotic causes of chest pain. __________  ETT 04/30/2021: Baseline EKG demonstrates normal sinus rhythm. The patient exhibited fair exercise capacity with a hypertensive blood pressure response. No angina was reported. No significant ST segment or T wave changes were observed during stress or recovery. No significant arrhythmia occurred. Low risk exercise tolerance test (Duke Treadmill Score = +6).   Low risk exercise tolerance test without evidence of ischemia at workload achieved. Hypertensive blood pressure response noted. __________   2D echo 04/16/2021: 1. Left ventricular ejection fraction, by estimation, is 60 to 65%. The  left ventricle has normal function. The left ventricle has no regional  wall motion abnormalities. Left ventricular diastolic parameters are  consistent with Grade I diastolic  dysfunction (impaired relaxation). The average left ventricular global  longitudinal strain is -17.9 %. The global longitudinal strain is normal.   2. Right  ventricular systolic function is normal. The right ventricular  size is normal. Tricuspid regurgitation signal is inadequate for assessing  PA pressure.   EKG:  EKG is ordered today.  The EKG ordered today demonstrates ***  Recent Labs: 03/07/2022: ALT 23; BUN <5; Creatinine, Ser 0.80; Hemoglobin 13.9; Platelets 411; Potassium 3.4; Sodium 137  Recent Lipid Panel No results found for: "CHOL", "TRIG", "HDL", "CHOLHDL", "VLDL", "LDLCALC", "LDLDIRECT"  PHYSICAL EXAM:    VS:  LMP 05/13/2015 (Approximate)   BMI: There is no height or weight on file to calculate BMI.  Physical Exam  Wt Readings from Last 3 Encounters:  03/30/22 240 lb (108.9 kg)  03/03/22 229 lb 15 oz (104.3 kg)  10/20/21 245 lb (111.1 kg)     ASSESSMENT & PLAN:   ***  HTN: Blood pressure  Sleep disordered breathing:   {Are you ordering a CV Procedure (e.g. stress test, cath, DCCV, TEE, etc)?   Press F2        :[id number]}      Disposition: F/u with Dr. Saunders Revel or an APP in ***.   Medication Adjustments/Labs and Tests Ordered: Current medicines are reviewed at length with the patient today.  Concerns regarding medicines are outlined above. Medication changes, Labs and Tests ordered today are summarized above and listed in the Patient Instructions accessible in Encounters.   Signed, Christell Faith, PA-C 05/28/2022 9:57 AM     [place] [address] [place] [address] [place], [place] [address] [phone]

## 2022-05-30 ENCOUNTER — Other Ambulatory Visit: Payer: Self-pay

## 2022-05-30 ENCOUNTER — Encounter (HOSPITAL_COMMUNITY): Payer: Self-pay

## 2022-05-30 ENCOUNTER — Emergency Department (HOSPITAL_COMMUNITY)
Admission: EM | Admit: 2022-05-30 | Discharge: 2022-05-30 | Disposition: A | Discharge status: Home / Self Care | Payer: Medicaid Other | Attending: Emergency Medicine | Admitting: Emergency Medicine

## 2022-05-30 DIAGNOSIS — Z760 Encounter for issue of repeat prescription: Secondary | ICD-10-CM

## 2022-05-30 DIAGNOSIS — M549 Dorsalgia, unspecified: Secondary | ICD-10-CM | POA: Insufficient documentation

## 2022-05-30 MED ORDER — METHOCARBAMOL 500 MG PO TABS: 500.0000 mg | ORAL_TABLET | Freq: Three times a day (TID) | ORAL | 0 refills | Status: DC | PRN

## 2022-05-30 NOTE — ED Triage Notes (Signed)
Patient requesting refill of robaxin for her back pain

## 2022-05-30 NOTE — ED Provider Notes (Signed)
[facility]ER[facility]ENC[facility] DEPARTMENT Provider Note   CSN: [id number] Arrival date & time: 05/30/22  1502     History  No chief complaint on file.   Sherry Watkins is a 41 y.o. female who presents emergency department for medication refill.  She has been using Robaxin for her back pain which is helpful.  Was hoping she could get some more.  Does not have a PCP at this time.  No other concerns or complaints at this time  HPI     Home Medications Prior to Admission medications   Medication Sig Start Date End Date Taking? Authorizing Provider  acetaminophen (TYLENOL) 500 MG tablet Take 1,000 mg by mouth every 6 (six) hours as needed for moderate pain or headache.    [provider]  ivabradine (CORLANOR) 7.5 MG TABS tablet Please take tablets TWO hours prior to your cardiac CT scan. 04/05/22   Kate Sable, MD  lidocaine (LIDODERM) 5 % Place 1 patch onto the skin daily as needed. Apply patch to area most significant pain once per day.  Remove and discard patch within 12 hours of application. 03/08/22   Petrucelli, Samantha R, PA-C  losartan (COZAAR) 100 MG tablet Take 1 tablet (100 mg total) by mouth daily. 04/02/22   End, Harrell Gave, MD  methocarbamol (ROBAXIN) 500 MG tablet Take 1 tablet (500 mg total) by mouth every 8 (eight) hours as needed for muscle spasms. 03/08/22   Petrucelli, Samantha R, PA-C  metoprolol tartrate (LOPRESSOR) 100 MG tablet Take 1 tablet (100 mg total) by mouth as directed. TAKE 2 HOURS BEFORE YOUR TEST 04/05/22 07/04/22  Kate Sable, MD      Allergies    Patient has no known allergies.    Review of Systems   Review of Systems  Physical Exam Updated Vital Signs BP (!) 144/88   Pulse 88   Temp 98.7 F (37.1 C) (Oral)   Resp 18   LMP 05/13/2015 (Approximate)   SpO2 98%  Physical Exam Physical Exam  Nursing note and vitals reviewed. Constitutional: She is oriented to person, place, and time. She appears well-developed  and well-nourished. No distress.  HENT:  Head: Normocephalic and atraumatic.  Eyes: Conjunctivae normal and EOM are normal. Pupils are equal, round, and reactive to light. No scleral icterus.  Neck: Normal range of motion.  Cardiovascular: Normal rate, regular rhythm and normal heart sounds.  Exam reveals no gallop and no friction rub.   No murmur heard. Pulmonary/Chest: Effort normal and breath sounds normal. No respiratory distress.  Abdominal: Soft. Bowel sounds are normal. She exhibits no distension and no mass. There is no tenderness. There is no guarding.  Neurological: She is alert and oriented to person, place, and time.  No lower extremity weakness. Skin: Skin is warm and dry. She is not diaphoretic.   ED Results / Procedures / Treatments   Labs (all labs ordered are listed, but only abnormal results are displayed) Labs Reviewed - No data to display  EKG None  Radiology No results found.  Procedures Procedures    Medications Ordered in ED Medications - No data to display  ED Course/ Medical Decision Making/ A&P                           Medical Decision Making  Patient here for medication refill, no other acute complaints.  Appears appropriate for discharge at this time        Final Clinical  Impression(s) / ED Diagnoses Final diagnoses:  Encounter for medication refill    Rx / DC Orders ED Discharge Orders     None         Margarita Mail, PA-C 05/30/22 1532    Regan Lemming, MD 05/30/22 1649

## 2022-05-31 ENCOUNTER — Ambulatory Visit: Payer: Self-pay | Admitting: Physician Assistant

## 2022-06-05 ENCOUNTER — Other Ambulatory Visit: Payer: Self-pay | Admitting: Internal Medicine

## 2022-06-09 ENCOUNTER — Other Ambulatory Visit: Payer: Self-pay

## 2022-06-11 ENCOUNTER — Ambulatory Visit: Payer: Self-pay | Admitting: Physician Assistant

## 2022-06-21 ENCOUNTER — Other Ambulatory Visit: Payer: Self-pay

## 2022-06-21 ENCOUNTER — Emergency Department
Admission: EM | Admit: 2022-06-21 | Discharge: 2022-06-21 | Disposition: A | Discharge status: Home / Self Care | Payer: Medicaid Other | Attending: Emergency Medicine | Admitting: Emergency Medicine

## 2022-06-21 DIAGNOSIS — I509 Heart failure, unspecified: Secondary | ICD-10-CM | POA: Insufficient documentation

## 2022-06-21 DIAGNOSIS — K0889 Other specified disorders of teeth and supporting structures: Secondary | ICD-10-CM

## 2022-06-21 DIAGNOSIS — K0510 Chronic gingivitis, plaque induced: Secondary | ICD-10-CM

## 2022-06-21 DIAGNOSIS — K0501 Acute gingivitis, non-plaque induced: Secondary | ICD-10-CM | POA: Insufficient documentation

## 2022-06-21 MED ORDER — LIDOCAINE VISCOUS HCL 2 % MT SOLN
15.0000 mL | Freq: Once | OROMUCOSAL | Status: AC
  Administered 2022-06-21: 15 mL via OROMUCOSAL
  Filled 2022-06-21: qty 15

## 2022-06-21 MED ORDER — CHLORHEXIDINE GLUCONATE 0.12 % MT SOLN: 15.0000 mL | Freq: Two times a day (BID) | OROMUCOSAL | 0 refills | Status: DC

## 2022-06-21 MED ORDER — ACETAMINOPHEN 325 MG PO TABS
650.0000 mg | ORAL_TABLET | Freq: Once | ORAL | Status: AC
  Administered 2022-06-21: 650 mg via ORAL
  Filled 2022-06-21: qty 2

## 2022-06-21 MED ORDER — PENICILLIN V POTASSIUM 500 MG PO TABS: 500.0000 mg | ORAL_TABLET | Freq: Three times a day (TID) | ORAL | 0 refills | Status: AC

## 2022-06-21 NOTE — Discharge Instructions (Signed)
Use medications as prescribed.  Please follow-up with your dentist as soon as possible.  Please return for any new, worsening, or change in symptoms or other concerns.

## 2022-06-21 NOTE — ED Provider Notes (Signed)
[facility] Provider Note    Event Date/Time   First MD Initiated Contact with Patient 06/21/22 1202     (approximate)   History   Dental Pain   HPI  Sherry Watkins is a 41 y.o. female with a past medical history of chronic pain syndrome, heart failure, endometriosis who presents today for evaluation of dental pain.  Patient reports that this began 3 days ago.  She denies any injury.  She reports that she is able to eat and drink.  She has not had a voice change.  No difficulty swallowing or breathing.  Patient Active Problem List   Diagnosis Date Noted   Chest pain 04/22/2021   Shortness of breath 04/22/2021   Chronic heart failure with preserved ejection fraction (HFpEF) ([place]) 04/22/2021   Endometriosis 10/17/2018   Adenomyosis 08/17/2018   Chronic low back pain (Right) without sciatica 02/09/2018   Elevated sed rate 02/01/2018   Vitamin D deficiency 02/01/2018   Chronic sacroiliac joint pain (Right) 02/01/2018   Other specified dorsopathies, sacral and sacrococcygeal region 02/01/2018   Chronic abdominal pain (RLQ) (Primary Area of Pain) (Right) 12/26/2017   Chronic lower extremity pain (Secondary Area of Pain) (Right) 12/26/2017   Chronic low back pain with right-sided sciatica [facility] Area of Pain) (Bilateral) (R>L) 12/26/2017   Chronic pain syndrome 12/26/2017   Opiate use 12/26/2017   Problems influencing health status 12/26/2017   Pharmacologic therapy 12/26/2017   Disorder of skeletal system 12/26/2017        Physical Exam   Triage Vital Signs: ED Triage Vitals  Enc Vitals Group     BP 06/21/22 1142 (S) (!) 174/112     Pulse Rate 06/21/22 1142 84     Resp 06/21/22 1142 17     Temp 06/21/22 1142 98.3 F (36.8 C)     Temp Source 06/21/22 1142 Oral     SpO2 06/21/22 1142 99 %     Weight 06/21/22 1143 248 lb (112.5 kg)     Height 06/21/22 1143 '5\' 2"'$  (1.575 m)     Head Circumference --      Peak Flow --      Pain Score  06/21/22 1142 10     Pain Loc --      Pain Edu? --      Excl. in [place]? --     Most recent vital signs: Vitals:   06/21/22 1142  BP: (S) (!) 174/112  Pulse: 84  Resp: 17  Temp: 98.3 F (36.8 C)  SpO2: 99%    Physical Exam Vitals and nursing note reviewed.  Constitutional:      General: Awake and alert. No acute distress.    Appearance: Normal appearance. The patient is obese.  HENT:     Head: Normocephalic and atraumatic.     Mouth: Mucous membranes are moist.  Poor dentition.  Several missing teeth noted.  Slightly inflamed gingiva along the bottom front teeth without fluctuance or abscess.  No sublingual swelling.  No trismus.  No voice change.  No facial or neck swelling or erythema. Eyes:     General: PERRL. Normal EOMs        Right eye: No discharge.        Left eye: No discharge.     Conjunctiva/sclera: Conjunctivae normal.  Cardiovascular:     Rate and Rhythm: Normal rate and regular rhythm.     Pulses: Normal pulses.  Pulmonary:     Effort: Pulmonary effort is  normal. No respiratory distress.  Abdominal:     Abdomen is soft.  Musculoskeletal:        General: No swelling. Normal range of motion.     Cervical back: Normal range of motion and neck supple.  Skin:    General: Skin is warm and dry.     Capillary Refill: Capillary refill takes less than 2 seconds.     Findings: No rash.  Neurological:     Mental Status: The patient is awake and alert.      ED Results / Procedures / Treatments   Labs (all labs ordered are listed, but only abnormal results are displayed) Labs Reviewed - No data to display   EKG     RADIOLOGY     PROCEDURES:  Critical Care performed:   Procedures   MEDICATIONS ORDERED IN ED: Medications  lidocaine (XYLOCAINE) 2 % viscous mouth solution 15 mL (15 mLs Mouth/Throat Given 06/21/22 1221)  acetaminophen (TYLENOL) tablet 650 mg (650 mg Oral Given 06/21/22 1221)     IMPRESSION / MDM / ASSESSMENT AND PLAN / ED COURSE   I reviewed the triage vital signs and the nursing notes.   Differential diagnosis includes, but is not limited to, dental caries versus decay, gingivitis.  Patient is awake and alert, hemodynamically stable and afebrile.  She has an elevated blood pressure, but reports that she has not yet taken her blood pressure medication.  She has poor dentition diffusely with several missing teeth. I suspect some dental caries vs decay versus gingivitis. No gingival fluctuance concerning for gingival abscess.  No history of HIV to suggest necrotizing ulcerative gingivitis.  No trismus, nuchal rigidity, neck pain, hot potato voice, uvular deviation or malocclusion to suggest deep space infection. No sublingual swelling concerning for Ludwig's angina.  Patient was started on antibiotics and chlorhexidine mouth rinse.  Patient was treated symptomatically in the emergency department. Discussed care plan, return precautions, and advised close outpatient follow-up with dentist. Patient agrees with plan of care.    Patient's presentation is most consistent with acute, uncomplicated illness.      FINAL CLINICAL IMPRESSION(S) / ED DIAGNOSES   Final diagnoses:  Pain, dental  Gingivitis     Rx / DC Orders   ED Discharge Orders          Ordered    chlorhexidine (PERIDEX) 0.12 % solution  2 times daily        06/21/22 1212    penicillin v potassium (VEETID) 500 MG tablet  3 times daily        06/21/22 1212             Note:  This document was prepared using Dragon voice recognition software and may include unintentional dictation errors.   Emeline Gins 06/21/22 1244    Harvest Dark, MD 06/21/22 1442

## 2022-06-21 NOTE — ED Triage Notes (Signed)
Pt states she has had redness and swelling in her lower gums since Friday. Pt has not seena dentist  and denies any injury

## 2022-06-25 ENCOUNTER — Other Ambulatory Visit (HOSPITAL_COMMUNITY): Payer: Self-pay | Admitting: Cardiology

## 2022-06-25 NOTE — Telephone Encounter (Signed)
Please contact pt for future appointment. Pt due for 2 month f/u.

## 2022-06-28 NOTE — Telephone Encounter (Signed)
LMOV  

## 2022-07-14 ENCOUNTER — Other Ambulatory Visit: Payer: Self-pay | Admitting: Internal Medicine

## 2022-07-28 ENCOUNTER — Ambulatory Visit: Payer: Self-pay | Attending: Physician Assistant

## 2022-07-28 DIAGNOSIS — I5189 Other ill-defined heart diseases: Secondary | ICD-10-CM | POA: Insufficient documentation

## 2022-07-28 DIAGNOSIS — I5020 Unspecified systolic (congestive) heart failure: Secondary | ICD-10-CM

## 2022-07-28 LAB — ECHOCARDIOGRAM COMPLETE
AR max vel: 2.92 cm2
AV Area VTI: 3.06 cm2
AV Area mean vel: 2.73 cm2
AV Mean grad: 5 mmHg
AV Peak grad: 8.3 mmHg
Ao pk vel: 1.44 m/s
Area-P 1/2: 3.81 cm2
Calc EF: 59.6 %
S' Lateral: 2.4 cm
Single Plane A2C EF: 56.9 %
Single Plane A4C EF: 62.2 %

## 2022-08-15 ENCOUNTER — Other Ambulatory Visit: Payer: Self-pay | Admitting: Internal Medicine

## 2022-08-18 NOTE — Telephone Encounter (Signed)
LMOV  

## 2022-10-19 ENCOUNTER — Other Ambulatory Visit: Payer: Self-pay | Admitting: Internal Medicine

## 2022-12-20 ENCOUNTER — Ambulatory Visit (LOCAL_COMMUNITY_HEALTH_CENTER): Payer: 59

## 2022-12-20 DIAGNOSIS — Z111 Encounter for screening for respiratory tuberculosis: Secondary | ICD-10-CM

## 2022-12-23 ENCOUNTER — Ambulatory Visit (LOCAL_COMMUNITY_HEALTH_CENTER): Payer: 59

## 2022-12-23 DIAGNOSIS — Z111 Encounter for screening for respiratory tuberculosis: Secondary | ICD-10-CM

## 2022-12-23 LAB — TB SKIN TEST
Induration: 0 mm
TB Skin Test: NEGATIVE

## 2023-01-12 ENCOUNTER — Encounter: Payer: Self-pay | Admitting: Emergency Medicine

## 2023-01-12 ENCOUNTER — Emergency Department: Payer: Medicaid Other

## 2023-01-12 ENCOUNTER — Emergency Department
Admission: EM | Admit: 2023-01-12 | Discharge: 2023-01-12 | Disposition: A | Discharge status: Home / Self Care | Payer: Self-pay | Attending: Emergency Medicine | Admitting: Emergency Medicine

## 2023-01-12 DIAGNOSIS — I5030 Unspecified diastolic (congestive) heart failure: Secondary | ICD-10-CM | POA: Insufficient documentation

## 2023-01-12 DIAGNOSIS — R0789 Other chest pain: Secondary | ICD-10-CM | POA: Insufficient documentation

## 2023-01-12 DIAGNOSIS — I110 Hypertensive heart disease with heart failure: Secondary | ICD-10-CM | POA: Insufficient documentation

## 2023-01-12 DIAGNOSIS — I10 Essential (primary) hypertension: Secondary | ICD-10-CM

## 2023-01-12 LAB — BASIC METABOLIC PANEL
Anion gap: 5 (ref 5–15)
BUN: 9 mg/dL (ref 6–20)
CO2: 23 mmol/L (ref 22–32)
Calcium: 9 mg/dL (ref 8.9–10.3)
Chloride: 106 mmol/L (ref 98–111)
Creatinine, Ser: 0.9 mg/dL (ref 0.44–1.00)
GFR, Estimated: >60 mL/min (ref >60)
Glucose, Bld: 91 mg/dL (ref 70–99)
Potassium: 3.3 mmol/L — ABNORMAL LOW (ref 3.5–5.1)
Sodium: 134 mmol/L — ABNORMAL LOW (ref 135–145)

## 2023-01-12 LAB — TROPONIN I (HIGH SENSITIVITY): Troponin I (High Sensitivity): 5 ng/L (ref <18)

## 2023-01-12 LAB — CBC
HCT: 38.1 % (ref 36.0–46.0)
Hemoglobin: 11.9 g/dL — ABNORMAL LOW (ref 12.0–15.0)
MCH: 26.9 pg (ref 26.0–34.0)
MCHC: 31.2 g/dL (ref 30.0–36.0)
MCV: 86 fL (ref 80.0–100.0)
Platelets: 437 10*3/uL — ABNORMAL HIGH (ref 150–400)
RBC: 4.43 MIL/uL (ref 3.87–5.11)
RDW: 14.8 % (ref 11.5–15.5)
WBC: 10.8 10*3/uL — ABNORMAL HIGH (ref 4.0–10.5)
nRBC: 0 % (ref 0.0–0.2)

## 2023-01-12 MED ORDER — LIDOCAINE 5 % EX PTCH
1.0000 | MEDICATED_PATCH | CUTANEOUS | Status: DC
  Administered 2023-01-12: 1 via TRANSDERMAL
  Filled 2023-01-12: qty 1

## 2023-01-12 NOTE — ED Provider Notes (Signed)
[facility] Provider Note    Event Date/Time   First MD Initiated Contact with Patient 01/12/23 1508     (approximate)   History   Chief Complaint Chest Pain   HPI  Sherry Watkins is a 42 y.o. female with past medical history of hypertension, diastolic CHF, endometriosis, and chronic pain syndrome who presents to the ED complaining of chest pain.  Patient reports that she has been dealing with constant pain in the left side of her chest for the past 24 hours.  She describes the pain as an aching and soreness that is exacerbated by using her left arm.  She denies any trauma to her chest or heavy lifting, has not taken anything for pain at home.  She denies any fevers, cough, shortness of breath, leg swelling or pain.  She reports dealing with similar pain in the past, for which she has seen cardiology with no clear diagnosis.  She does not take OCPs.     Physical Exam   Triage Vital Signs: ED Triage Vitals  Enc Vitals Group     BP 01/12/23 1357 (!) 183/106     Pulse Rate 01/12/23 1357 93     Resp 01/12/23 1357 17     Temp 01/12/23 1357 98.1 F (36.7 C)     Temp Source 01/12/23 1357 Oral     SpO2 01/12/23 1357 100 %     Weight 01/12/23 1358 255 lb (115.7 kg)     Height 01/12/23 1358 5' 2"$  (1.575 m)     Head Circumference --      Peak Flow --      Pain Score 01/12/23 1358 10     Pain Loc --      Pain Edu? --      Excl. in [place]? --     Most recent vital signs: Vitals:   01/12/23 1357 01/12/23 1509  BP: (!) 183/106 (!) 172/103  Pulse: 93 71  Resp: 17 15  Temp: 98.1 F (36.7 C)   SpO2: 100% 100%    Constitutional: Alert and oriented. Eyes: Conjunctivae are normal. Head: Atraumatic. Nose: No congestion/rhinnorhea. Mouth/Throat: Mucous membranes are moist.  Cardiovascular: Normal rate, regular rhythm. Grossly normal heart sounds.  2+ radial pulses bilaterally. Respiratory: Normal respiratory effort.  No retractions. Lungs CTAB.  Left chest  wall tender to palpation, reproducing reported pain. Gastrointestinal: Soft and nontender. No distention. Musculoskeletal: No lower extremity tenderness nor edema.  Neurologic:  Normal speech and language. No gross focal neurologic deficits are appreciated.    ED Results / Procedures / Treatments   Labs (all labs ordered are listed, but only abnormal results are displayed) Labs Reviewed  BASIC METABOLIC PANEL - Abnormal; Notable for the following components:      Result Value   Sodium 134 (*)    Potassium 3.3 (*)    All other components within normal limits  CBC - Abnormal; Notable for the following components:   WBC 10.8 (*)    Hemoglobin 11.9 (*)    Platelets 437 (*)    All other components within normal limits  TROPONIN I (HIGH SENSITIVITY)     EKG  ED ECG REPORT I, Blake Divine, the attending physician, personally viewed and interpreted this ECG.   Date: 01/12/2023  EKG Time: 13:54  Rate: 76  Rhythm: normal sinus rhythm  Axis: Normal  Intervals:none  ST&T Change: None  RADIOLOGY Chest x-ray reviewed and interpreted by me with no infiltrate, edema, or effusion.  PROCEDURES:  Critical Care performed: No  Procedures   MEDICATIONS ORDERED IN ED: Medications  lidocaine (LIDODERM) 5 % 1 patch (1 patch Transdermal Patch Applied 01/12/23 1553)     IMPRESSION / MDM / ASSESSMENT AND PLAN / ED COURSE  I reviewed the triage vital signs and the nursing notes.                              42 y.o. female with past medical history of hypertension, diastolic CHF, endometriosis, and chronic pain syndrome who presents to the ED complaining of constant left chest pain for the past 24 hours.  Patient's presentation is most consistent with acute presentation with potential threat to life or bodily function.  Differential diagnosis includes, but is not limited to, ACS, PE, dissection, pneumonia, pneumothorax, GERD, musculoskeletal pain, anxiety.  Patient well-appearing  and in no acute distress, vital signs are markable for elevated blood pressure but otherwise reassuring.  EKG shows no evidence of arrhythmia or ischemia and troponin is negative, doubt ACS given constant pain over the past 24 hours which is reproducible with palpation.  Low suspicion for PE as patient is PERC negative, doubt dissection with reproducible pain.  Remainder of labs are reassuring with no significant anemia, leukocytosis, electrolyte abnormality, or AKI.  Chest x-ray is also unremarkable, patient is status post hysterectomy.  She is appropriate for discharge home with cardiology or PCP follow-up, was counseled to return to the ED for new or worsening symptoms.  Patient agrees with plan.      FINAL CLINICAL IMPRESSION(S) / ED DIAGNOSES   Final diagnoses:  Atypical chest pain  Uncontrolled hypertension     Rx / DC Orders   ED Discharge Orders     None        Note:  This document was prepared using Dragon voice recognition software and may include unintentional dictation errors.   Blake Divine, MD 01/12/23 [id number]

## 2023-01-12 NOTE — ED Triage Notes (Signed)
Pt endorses left upper CP since yesterday. Denies SOB, n/v.

## 2023-03-07 ENCOUNTER — Other Ambulatory Visit: Payer: Self-pay

## 2023-03-07 ENCOUNTER — Emergency Department
Admission: EM | Admit: 2023-03-07 | Discharge: 2023-03-07 | Disposition: A | Discharge status: Home / Self Care | Payer: Medicaid Other | Attending: Emergency Medicine | Admitting: Emergency Medicine

## 2023-03-07 DIAGNOSIS — I509 Heart failure, unspecified: Secondary | ICD-10-CM | POA: Insufficient documentation

## 2023-03-07 DIAGNOSIS — Z1152 Encounter for screening for COVID-19: Secondary | ICD-10-CM | POA: Insufficient documentation

## 2023-03-07 DIAGNOSIS — R0981 Nasal congestion: Secondary | ICD-10-CM | POA: Insufficient documentation

## 2023-03-07 DIAGNOSIS — R509 Fever, unspecified: Secondary | ICD-10-CM | POA: Insufficient documentation

## 2023-03-07 LAB — RESP PANEL BY RT-PCR (RSV, FLU A&B, COVID)  RVPGX2
Influenza A by PCR: NEGATIVE
Influenza B by PCR: NEGATIVE
Resp Syncytial Virus by PCR: NEGATIVE
SARS Coronavirus 2 by RT PCR: NEGATIVE

## 2023-03-07 NOTE — Discharge Instructions (Signed)
Your COVID/flu/RSV swab is negative.  You may continue to take Tylenol/ibuprofen per package instructions to help with your symptoms.  Please return for any new, worsening, or change in symptoms other concerns.  It was a pleasure caring for you today.

## 2023-03-07 NOTE — ED Triage Notes (Signed)
Pt here with a fever and chills that started Sat. Pt denies much coughing but states she has been sneezing a lot. Pt denies pain.

## 2023-03-07 NOTE — ED Provider Notes (Signed)
[facility] Provider Note    Event Date/Time   First MD Initiated Contact with Patient 03/07/23 [phone]     (approximate)   History   Fever   HPI  Sherry Watkins is a 42 y.o. female with a past medical history of heart failure with preserved ejection fraction, endometriosis, presents today for evaluation of fever, chills, sneezing, stuffy nose for the past couple of days.  Patient reports that she has not had any sick contacts.  She denies chest pain or shortness of breath.  She denies abdominal pain, nausea, vomiting, diarrhea.  She has not taken anything for her symptoms.  She reports that it [birth year] her [birth year] on [birth year] and she does not wish to be sick.  Patient Active Problem List   Diagnosis Date Noted   Chest pain 04/22/2021   Shortness of breath 04/22/2021   Chronic heart failure with preserved ejection fraction (HFpEF) 04/22/2021   Endometriosis 10/17/2018   Adenomyosis 08/17/2018   Chronic low back pain (Right) without sciatica 02/09/2018   Elevated sed rate 02/01/2018   Vitamin D deficiency 02/01/2018   Chronic sacroiliac joint pain (Right) 02/01/2018   Other specified dorsopathies, sacral and sacrococcygeal region 02/01/2018   Chronic abdominal pain (RLQ) (Primary Area of Pain) (Right) 12/26/2017   Chronic lower extremity pain (Secondary Area of Pain) (Right) 12/26/2017   Chronic low back pain with right-sided sciatica [facility] Area of Pain) (Bilateral) (R>L) 12/26/2017   Chronic pain syndrome 12/26/2017   Opiate use 12/26/2017   Problems influencing health status 12/26/2017   Pharmacologic therapy 12/26/2017   Disorder of skeletal system 12/26/2017          Physical Exam   Triage Vital Signs: ED Triage Vitals  Enc Vitals Group     BP 03/07/23 0834 (!) 155/90     Pulse Rate 03/07/23 0834 90     Resp 03/07/23 0834 16     Temp 03/07/23 0834 98.3 F (36.8 C)     Temp Source 03/07/23 0834 Oral     SpO2 03/07/23 0834 98 %     Weight  03/07/23 0833 255 lb 1.2 oz (115.7 kg)     Height 03/07/23 0833  (1.575 m)     Head Circumference --      Peak Flow --      Pain Score 03/07/23 0833 0     Pain Loc --      Pain Edu? --      Excl. in GC? --     Most recent vital signs: Vitals:   03/07/23 0834  BP: (!) 155/90  Pulse: 90  Resp: 16  Temp: 98.3 F (36.8 C)  SpO2: 98%    Physical Exam Vitals and nursing note reviewed.  Constitutional:      General: Awake and alert. No acute distress. Texting on phone throughout history and exam and rolling her eyes    Appearance: Normal appearance. The patient is obese.  HENT:     Head: Normocephalic and atraumatic.     Mouth: Mucous membranes are moist.  Nasal congestion present Eyes:     General: PERRL. Normal EOMs        Right eye: No discharge.        Left eye: No discharge.     Conjunctiva/sclera: Conjunctivae normal.  Cardiovascular:     Rate and Rhythm: Normal rate and regular rhythm.     Pulses: Normal pulses.  Pulmonary:     Effort: Pulmonary effort is  normal. No respiratory distress.     Breath sounds: Normal breath sounds.  Lungs are clear to auscultation bilaterally Abdominal:     Abdomen is soft. There is no abdominal tenderness. No rebound or guarding. No distention. Musculoskeletal:        General: No swelling. Normal range of motion.     Cervical back: Normal range of motion and neck supple.  No cervical lymphadenopathy Skin:    General: Skin is warm and dry.     Capillary Refill: Capillary refill takes less than 2 seconds.     Findings: No rash.  Neurological:     Mental Status: The patient is awake and alert.      ED Results / Procedures / Treatments   Labs (all labs ordered are listed, but only abnormal results are displayed) Labs Reviewed  RESP PANEL BY RT-PCR (RSV, FLU A&B, COVID)  [id number]     EKG     RADIOLOGY     PROCEDURES:  Critical Care performed:   Procedures   MEDICATIONS ORDERED IN ED: Medications - No data to  display   IMPRESSION / MDM / ASSESSMENT AND PLAN / ED COURSE  I reviewed the triage vital signs and the nursing notes.   Differential diagnosis includes, but is not limited to, COVID, flu, URI, seasonal allergies.  Patient is awake and alert, hemodynamically stable and afebrile.  She is nontoxic in appearance.  Her lungs are clear to auscultation bilaterally, no wheezing or increased work of breathing.  She appears to be euvolemic.  No abdominal pain, nausea, vomiting, diarrhea to suggest intra-abdominal etiology.  She has not had any dysuria or hematuria to suggest urinary etiology.  She declines any further workup besides a swab.  Swab is negative.  Patient is reassured by these findings. We discussed strict return precautions.  Patient understands and agrees with plan.  She was discharged in stable condition.  Patient's presentation is most consistent with acute complicated illness / injury requiring diagnostic workup.      FINAL CLINICAL IMPRESSION(S) / ED DIAGNOSES   Final diagnoses:  Nasal congestion     Rx / DC Orders   ED Discharge Orders     None        Note:  This document was prepared using Dragon voice recognition software and may include unintentional dictation errors.   Keturah Shavers 03/07/23 1055    Georga Hacking, MD 03/07/23 [phone]

## 2023-04-25 ENCOUNTER — Ambulatory Visit (LOCAL_COMMUNITY_HEALTH_CENTER): Payer: Self-pay

## 2023-04-25 DIAGNOSIS — Z111 Encounter for screening for respiratory tuberculosis: Secondary | ICD-10-CM

## 2023-05-30 ENCOUNTER — Encounter: Payer: Self-pay | Admitting: Cardiology

## 2023-05-30 ENCOUNTER — Ambulatory Visit: Payer: 59 | Attending: Cardiology | Admitting: Cardiology

## 2023-05-30 VITALS — BP 131/84 | HR 81 | Ht 62.0 in | Wt 248.8 lb

## 2023-05-30 DIAGNOSIS — R079 Chest pain, unspecified: Secondary | ICD-10-CM

## 2023-05-30 DIAGNOSIS — E782 Mixed hyperlipidemia: Secondary | ICD-10-CM | POA: Diagnosis not present

## 2023-05-30 DIAGNOSIS — R0602 Shortness of breath: Secondary | ICD-10-CM | POA: Diagnosis not present

## 2023-05-30 DIAGNOSIS — I10 Essential (primary) hypertension: Secondary | ICD-10-CM

## 2023-05-30 DIAGNOSIS — R0609 Other forms of dyspnea: Secondary | ICD-10-CM

## 2023-05-30 DIAGNOSIS — I5189 Other ill-defined heart diseases: Secondary | ICD-10-CM

## 2023-05-30 DIAGNOSIS — G8929 Other chronic pain: Secondary | ICD-10-CM

## 2023-05-30 MED ORDER — ATORVASTATIN CALCIUM 20 MG PO TABS: 20.0000 mg | ORAL_TABLET | Freq: Every day | ORAL | 3 refills | Status: DC

## 2023-05-30 NOTE — Progress Notes (Signed)
Cardiology Office Note:  .   Date:  05/30/2023  ID:  Sherry Watkins, DOB 05-14-1981, MRN [id number] [facility]CP: [facility]   HeartCare Providers Cardiologist:  Yvonne Kendall, MD    History of Present Illness: .   Sherry Watkins is a 42 y.o. female with a past medical history of diastolic dysfunction, hypertension, endometriosis, gastroesophageal reflux disease, obesity, who presents today for emergency department follow-up.  Over the years she has had multiple ED visits for varying amounts of atypical chest pain.  All resulted with negative troponins and unrevealing workup in the emergency department.  Echocardiogram in 03/2021 demonstrated an EF of 60 to 65%, no regional wall motion abnormality, G1 DD, without significant valvular abnormalities.  ETT in 04/2021 was low risk without evidence of ischemia with hypertensive blood pressure response being noted.  She was last seen in clinic 03/30/2022 for ED follow-up.  At that time she noted approximately year-long history of intermittent substernal chest pressure that was randomly occurring that would last for a week and sometimes.  She notes several week history of left shoulder and upper back burning.  She had been on Robaxin that was prescribed emergency department to help with her upper back discomfort.  On exam she was pain-free.  She was scheduled for coronary CTA and an echocardiogram with aggressive risk factor modification.  Since her last office in clinic she has had 4 visits to the emergency department for varying symptoms of fever, chest pain, dental pain, or even medication refills needed.  Each time emergency department workup was unrevealing and she was sent home with the recommended outpatient follow-up.  She returns to clinic today stating that she has had several bouts of lightheadedness but denies any near syncope or syncopal episodes.  Continues to have chronic chest discomfort and dyspnea on exertion that  is unchanged since 2022.  Swelling to bilateral lower extremities has improved with better blood pressure control.  She has been started on several different medications from her PCP that she has been taking without complications. Blood pressure has been better controlled. Previously had been scheduled for a sleep study but did testing was not completed. Complains of lack of sleep and snoring. Denies any recent visits to the emergency department of hospitalizations.   ROS: 10 point review of systems has been completed and considered negative with exception of what is listed in the HPI  Studies Reviewed: Marland Kitchen   EKG Interpretation Date/Time:  Monday May 30 2023 13:14:01 EDT Ventricular Rate:  81 PR Interval:  162 QRS Duration:  76 QT Interval:  386 QTC Calculation: 448 R Axis:   43  Text Interpretation: Normal sinus rhythm Normal ECG When compared with ECG of 12-Jan-2023 13:54, Inverted T waves have replaced nonspecific T wave abnormality in Inferior leads Confirmed by Charlsie Quest ([id number]) on 05/30/2023 1:23:55 PM   TTE 07/28/22 1. Left ventricular ejection fraction, by estimation, is 60 to 65%. The  left ventricle has normal function. The left ventricle has no regional  wall motion abnormalities. Left ventricular diastolic parameters were  normal.   2. Right ventricular systolic function is normal. The right ventricular  size is normal.   3. The mitral valve is normal in structure. No evidence of mitral valve  regurgitation.   4. The aortic valve was not well visualized. Aortic valve regurgitation  is not visualized.   Coronary CTA 04/15/22 IMPRESSION: 1. Coronary calcium score of 0. Patient is low risk for coronary events.  2. Normal coronary origin with right dominance.   3. No evidence of CAD.   4. CAD-RADS 0. Consider non-atherosclerotic causes of chest pain.  ETT 04/30/2021: Baseline EKG demonstrates normal sinus rhythm. The patient exhibited fair exercise capacity with a  hypertensive blood pressure response. No angina was reported. No significant ST segment or T wave changes were observed during stress or recovery. No significant arrhythmia occurred. Low risk exercise tolerance test (Duke Treadmill Score = +6).   Low risk exercise tolerance test without evidence of ischemia at workload achieved. Hypertensive blood pressure response noted. __________   2D echo 04/16/2021: 1. Left ventricular ejection fraction, by estimation, is 60 to 65%. The  left ventricle has normal function. The left ventricle has no regional  wall motion abnormalities. Left ventricular diastolic parameters are  consistent with Grade I diastolic  dysfunction (impaired relaxation). The average left ventricular global  longitudinal strain is -17.9 %. The global longitudinal strain is normal.   2. Right ventricular systolic function is normal. The right ventricular  size is normal. Tricuspid regurgitation signal is inadequate for assessing  PA pressure. Risk Assessment/Calculations:          Physical Exam:   VS:  BP 131/84 (BP Location: Left Arm, Patient Position: Sitting, Cuff Size: Large)   Pulse 81   Ht 5\' 2"  (1.575 m)   Wt 248 lb 12.8 oz (112.9 kg)   LMP 05/13/2015 (Approximate)   SpO2 99%   BMI 45.51 kg/m    Wt Readings from Last 3 Encounters:  05/30/23 248 lb 12.8 oz (112.9 kg)  03/07/23 255 lb 1.2 oz (115.7 kg)  01/12/23 255 lb (115.7 kg)    GEN: Well nourished, well developed in no acute distress NECK: No JVD; No carotid bruits CARDIAC: RRR, no murmurs, rubs, gallops RESPIRATORY:  Clear to auscultation without rales, wheezing or rhonchi  ABDOMEN: Soft, non-tender, obese, non-distended EXTREMITIES: Trace pretibial edema; No deformity   ASSESSMENT AND PLAN: .   Chronic chest pain that has been ongoing since 2022.  Patient is echocardiogram that revealed an LVEF of 60 to 65%, no regional wall motion abnormalities.  She also had a coronary CTA with coronary calcium  score of 0 and the recommendation was to consider nonatherosclerotic causes of chest pain.  Will continue to need aggressive risk factor modification.  Has recently been started on amlodipine by her PCP they can work as an antispasmodic as she is having coronary spasms can consider diltiazem for better blood pressure control and has not antispasmodic if not controlled with amlodipine.  EKG today reveals sinus rhythm with no ischemic changes.  Dyspnea on exertion with diastolic dysfunction and chronic pedal edema.  LVEF on echocardiogram 60 to 65%, no regional wall motion abnormalities, impaired relaxation due to hypertension.  No structural abnormalities were noted.  Patient encouraged to continue with her weight loss journey.  Dyspnea is likely multifactorial from hypertension and obesity.  Hypertension with blood pressure today 121/84 which is greatly improved.  She is continued on losartan 100 mg in the a.m. along with metoprolol succinate 25 mg in the a.m. and amlodipine 5 mg at noon.  Would only add diltiazem if her lower extremity swelling is not an issue and amlodipine is not adequate.  Encouraged low-sodium diet.  Hyperlipidemia with an LDL of 187.  She states that her PCP started on 20 mg of atorvastatin that she has tolerated well.  This continues to be managed by her PCP.  Sleep disordered breathing.  Patient states  that she does not sleep well and has longstanding history of increased snoring.  Discussed sleep study.  Patient states that she prefers to wait until she follows up again with her primary care provider to rediscuss this.       Dispo: Patient return to clinic to see MD/APP in 1 year or sooner if needed to reevaluate symptoms.  Signed, Marina Boerner, NP

## 2023-05-30 NOTE — Patient Instructions (Signed)
Medication Instructions:  Your physician recommends that you continue on your current medications as directed. Please refer to the Current Medication list given to you today.  *If you need a refill on your cardiac medications before your next appointment, please call your pharmacy*   Lab Work: none If you have labs (blood work) drawn today and your tests are completely normal, you will receive your results only by: MyChart Message (if you have MyChart) OR A paper copy in the mail If you have any lab test that is abnormal or we need to change your treatment, we will call you to review the results.   Testing/Procedures: none  Follow-Up: At [facility], you and your health needs are our priority.  As part of our continuing mission to provide you with exceptional heart care, we have created designated Provider Care Teams.  These Care Teams include your primary Cardiologist (physician) and Advanced Practice Providers (APPs -  Physician Assistants and Nurse Practitioners) who all work together to provide you with the care you need, when you need it.  We recommend signing up for the patient portal called "MyChart".  Sign up information is provided on this After Visit Summary.  MyChart is used to connect with patients for Virtual Visits (Telemedicine).  Patients are able to view lab/test results, encounter notes, upcoming appointments, etc.  Non-urgent messages can be sent to your provider as well.   To learn more about what you can do with MyChart, go to [email or web address].    Your next appointment:   1 year(s)  Provider:   You may see Yvonne Kendall, MD or one of the following Advanced Practice Providers on your designated Care Team:   Nicolasa Ducking, NP Eula Listen, PA-C Cadence Fransico Michael, PA-C Charlsie Quest, NP

## 2023-06-29 ENCOUNTER — Telehealth: Payer: Self-pay

## 2023-06-29 NOTE — Telephone Encounter (Signed)
Pt left msg on triage asking if there was any way she can get papers from when she had her partial hysterectomy so she can take to her doctor. Can you help with this?

## 2023-09-28 ENCOUNTER — Other Ambulatory Visit: Payer: Self-pay

## 2023-09-28 ENCOUNTER — Emergency Department
Admission: EM | Admit: 2023-09-28 | Discharge: 2023-09-28 | Disposition: A | Discharge status: Home / Self Care | Payer: 59 | Attending: Emergency Medicine | Admitting: Emergency Medicine

## 2023-09-28 ENCOUNTER — Encounter: Payer: Self-pay | Admitting: Intensive Care

## 2023-09-28 DIAGNOSIS — K047 Periapical abscess without sinus: Secondary | ICD-10-CM | POA: Insufficient documentation

## 2023-09-28 DIAGNOSIS — K0889 Other specified disorders of teeth and supporting structures: Secondary | ICD-10-CM | POA: Diagnosis not present

## 2023-09-28 MED ORDER — AMOXICILLIN 875 MG PO TABS: 875.0000 mg | ORAL_TABLET | Freq: Two times a day (BID) | ORAL | 0 refills | Status: AC

## 2023-09-28 NOTE — ED Provider Notes (Signed)
[facility] Provider Note    Event Date/Time   First MD Initiated Contact with Patient 09/28/23 1059     (approximate)   History   Dental Pain   HPI  Sherry Watkins is a 42 y.o. female   presents to the ED with complaint of dental pain and swelling.  Patient has a history of dental caries and states she has been unable to see a dentist.  No over-the-counter medication has been taken.      Physical Exam   Triage Vital Signs: ED Triage Vitals  Encounter Vitals Group     BP 09/28/23 1052 137/84     Systolic BP Percentile --      Diastolic BP Percentile --      Pulse Rate 09/28/23 1050 96     Resp 09/28/23 1050 20     Temp 09/28/23 1050 98.6 F (37 C)     Temp Source 09/28/23 1050 Oral     SpO2 09/28/23 1050 95 %     Weight 09/28/23 1050 234 lb (106.1 kg)     Height 09/28/23 1050 5\' 2"  (1.575 m)     Head Circumference --      Peak Flow --      Pain Score 09/28/23 1050 9     Pain Loc --      Pain Education --      Exclude from Growth Chart --     Most recent vital signs: Vitals:   09/28/23 1052 09/28/23 1139  BP: 137/84 (!) 187/105  Pulse:  98  Resp:  18  Temp:    SpO2:  96%     General: Awake, no distress.  CV:  Good peripheral perfusion.  Resp:  Normal effort.  Abd:  No distention.  Other:  No appreciable edema is noted to the face.  Gum is tender to light palpation with a tongue depressor.  There is a molar that is partially gone due to carry right lower aspect.  No obvious abscess or drainage is noted.  Neck is supple with minimal cervical lymphadenopathy.   ED Results / Procedures / Treatments   Labs (all labs ordered are listed, but only abnormal results are displayed) Labs Reviewed - No data to display    PROCEDURES:  Critical Care performed:   Procedures   MEDICATIONS ORDERED IN ED: Medications - No data to display   IMPRESSION / MDM / ASSESSMENT AND PLAN / ED COURSE  I reviewed the triage vital signs and  the nursing notes.   Differential diagnosis includes, but is not limited to, dental carry, dental abscess, dental pain, gingivitis.  42 year old female presents to the ED with dental pain.  A prescription for amoxicillin was sent to the pharmacy and patient was given a list of dental clinics.  She is encouraged to take Tylenol or ibuprofen as needed and follow-up with her PCP.      Patient's presentation is most consistent with acute, uncomplicated illness.  FINAL CLINICAL IMPRESSION(S) / ED DIAGNOSES   Final diagnoses:  Dental abscess     Rx / DC Orders   ED Discharge Orders          Ordered    amoxicillin (AMOXIL) 875 MG tablet  2 times daily        09/28/23 1123             Note:  This document was prepared using Dragon voice recognition software and may include unintentional dictation errors.  Tommi Rumps, PA-C 09/28/23 1451    Pilar Jarvis, MD 09/28/23 1539

## 2023-09-28 NOTE — Discharge Instructions (Addendum)
Call and make an appointment with one of the dental clinics listed on your discharge papers.  To clinics that are started on your discharge papers also have walk-in hours that she can be seen.  Prescription was sent to your pharmacy to begin taking.  OPTIONS FOR DENTAL FOLLOW UP CARE  [place] [facility] - Local Safety Net Dental Clinics [email or web address]   [facility] [phone])  [facility] [phone])  [place] [phone])  [facility] [phone])  [facility] [phone]) This clinic caters to the indigent population and is on a lottery system. Location: [employer] [facility], [employer], [address] [place] Clinic Hours: Wednesdays from 6pm - 9pm, patients seen by a lottery system. For dates, call or go to [email or web address] Services: Cleanings, fillings and simple extractions. Payment Options: DENTAL WORK IS FREE OF CHARGE. Bring proof of income or support. Best way to get seen: Arrive at 5:15 pm - this is a lottery, NOT first come/first serve, so arriving earlier will not increase your chances of being seen.     [facility] [phone] Select option 1 for emergencies   Location: [facility], [place], [address], [place] Clinic Hours: No walk-ins accepted - call the day before to schedule an appointment. Check in times are 9:30 am and 1:30 pm. Services: Simple extractions, temporary fillings, pulpectomy/pulp debridement, uncomplicated abscess drainage. Payment Options: PAYMENT IS DUE AT THE TIME OF SERVICE.  Fee is usually $100-200, additional surgical procedures (e.g. abscess drainage) may be extra. Cash, checks, Visa/MasterCard accepted.  Can file Medicaid if patient is covered for dental - patient should call case worker  to check. No discount for [facility] patients. Best way to get seen: MUST call the day before and get onto the schedule. Can usually be seen the next 1-2 days. No walk-ins accepted.     [facility] [phone]   Location: [facility], [address], [place] Clinic Hours: M, W, Th, F 8am or 1:30pm, Tues 9a or 1:30 - first come/first served. Services: Simple extractions, temporary fillings, uncomplicated abscess drainage.  You do not need to be an [facility] resident. Payment Options: PAYMENT IS DUE AT THE TIME OF SERVICE. Dental insurance, otherwise sliding scale - bring proof of income or support. Depending on income and treatment needed, cost is usually $50-200. Best way to get seen: Arrive early as it is first come/first served.     [facility] [phone]   Location: [address] Clinic Hours: Mon-Thu 8a-5p Services: Most basic dental services including extractions and fillings. Payment Options: PAYMENT IS DUE AT THE TIME OF SERVICE. Sliding scale, up to 50% off - bring proof if income or support. Medicaid with dental option accepted. Best way to get seen: Call to schedule an appointment, can usually be seen within 2 weeks OR they will try to see walk-ins - show up at 8a or 2p (you may have to wait).     [facility] [phone] [place] RESIDENTS ONLY   Location: [facility], [address] [place], [place] [address] Clinic Hours: By appointment only. Monday - Thursday 8am-5pm, Friday 8am-12pm Services: Cleanings, fillings, extractions. Payment Options: PAYMENT IS DUE AT THE TIME OF SERVICE. Cash, Visa or MasterCard. Sliding scale - $30 minimum per service. Best way to get seen: Come in to office, complete packet  and make an appointment - need proof of income or support monies for each household member and proof of [facility] residence. Usually takes about a month to get in.     [facility] [phone]   Location: [address] [facility] Clinic Hours: Walk-in Urgent Care Dental Services are offered Monday-Friday mornings only. The numbers of emergencies accepted daily is limited to the number of providers available. Maximum 15 - Mondays, Wednesdays & Thursdays Maximum 10 - Tuesdays & Fridays Services: You do not need to be a [facility] resident to be seen for a dental emergency. Emergencies are defined as pain, swelling, abnormal bleeding, or dental trauma. Walkins will receive x-rays if needed. NOTE: Dental cleaning is not an emergency. Payment Options: PAYMENT IS DUE AT THE TIME OF SERVICE. Minimum co-pay is $40.00 for uninsured patients. Minimum co-pay is $3.00 for Medicaid with dental coverage. Dental Insurance is accepted and must be presented at time of visit. Medicare does not cover dental. Forms of payment: Cash, credit card, checks. Best way to get seen: If not previously registered with the clinic, walk-in dental registration begins at 7:15 am and is on a first come/first serve basis. If previously registered with the clinic, call to make an appointment.     [facility] [phone] [place] RESIDENTS ONLY   Location: [address], [place], [place] Clinic Hours: Mon-Thu 10a-2p Services: Extractions only! Payment Options: FREE (donations accepted) - bring proof of income or support Best way to get seen: Call and schedule an appointment OR come at 8am on the 1st Monday of every month (except for holidays) when it is first come/first served.     [facility] [phone]   Location: [address] [place], [place] Clinic Hours: Friday mornings Services, Payment Options, Best way to get seen: Call for info

## 2023-09-28 NOTE — ED Triage Notes (Signed)
Patient c/o bottom, middle dental pain. Some swelling noted. Reports history of same. Flare up started Saturday

## 2023-09-28 NOTE — ED Notes (Signed)
Pt reports pain at the front lower teeth for 3 days, states that it hurts at her submentum, denies difficulty swallowing, pt has a broken molar in the bottom right jaw

## 2024-06-08 ENCOUNTER — Emergency Department
Admission: EM | Admit: 2024-06-08 | Discharge: 2024-06-08 | Disposition: A | Discharge status: Home / Self Care | Payer: Self-pay | Attending: Emergency Medicine | Admitting: Emergency Medicine

## 2024-06-08 ENCOUNTER — Other Ambulatory Visit: Payer: Self-pay

## 2024-06-08 ENCOUNTER — Emergency Department: Payer: Self-pay

## 2024-06-08 DIAGNOSIS — I10 Essential (primary) hypertension: Secondary | ICD-10-CM | POA: Insufficient documentation

## 2024-06-08 DIAGNOSIS — R0789 Other chest pain: Secondary | ICD-10-CM

## 2024-06-08 LAB — TROPONIN I (HIGH SENSITIVITY)
Troponin I (High Sensitivity): 6 ng/L (ref <18)
Troponin I (High Sensitivity): 6 ng/L (ref <18)

## 2024-06-08 LAB — CBC
HCT: 33.4 % — ABNORMAL LOW (ref 36.0–46.0)
Hemoglobin: 9.8 g/dL — ABNORMAL LOW (ref 12.0–15.0)
MCH: 20.8 pg — ABNORMAL LOW (ref 26.0–34.0)
MCHC: 29.3 g/dL — ABNORMAL LOW (ref 30.0–36.0)
MCV: 70.9 fL — ABNORMAL LOW (ref 80.0–100.0)
Platelets: 591 K/uL — ABNORMAL HIGH (ref 150–400)
RBC: 4.71 MIL/uL (ref 3.87–5.11)
RDW: 18 % — ABNORMAL HIGH (ref 11.5–15.5)
WBC: 9.4 K/uL (ref 4.0–10.5)
nRBC: 0 % (ref 0.0–0.2)

## 2024-06-08 LAB — BASIC METABOLIC PANEL WITH GFR
Anion gap: 13 (ref 5–15)
BUN: 12 mg/dL (ref 6–20)
CO2: 21 mmol/L — ABNORMAL LOW (ref 22–32)
Calcium: 9.5 mg/dL (ref 8.9–10.3)
Chloride: 102 mmol/L (ref 98–111)
Creatinine, Ser: 0.95 mg/dL (ref 0.44–1.00)
GFR, Estimated: >60 mL/min (ref >60)
Glucose, Bld: 112 mg/dL — ABNORMAL HIGH (ref 70–99)
Potassium: 3.2 mmol/L — ABNORMAL LOW (ref 3.5–5.1)
Sodium: 136 mmol/L (ref 135–145)

## 2024-06-08 MED ORDER — LOSARTAN POTASSIUM 100 MG PO TABS: 100.0000 mg | ORAL_TABLET | Freq: Every day | ORAL | 2 refills | Status: DC

## 2024-06-08 MED ORDER — METOPROLOL SUCCINATE ER 25 MG PO TB24: 25.0000 mg | ORAL_TABLET | Freq: Every day | ORAL | 2 refills | Status: DC

## 2024-06-08 MED ORDER — AMLODIPINE BESYLATE 5 MG PO TABS: 5.0000 mg | ORAL_TABLET | Freq: Every day | ORAL | 2 refills | Status: DC

## 2024-06-08 MED ORDER — ATORVASTATIN CALCIUM 20 MG PO TABS: 20.0000 mg | ORAL_TABLET | Freq: Every day | ORAL | 2 refills | Status: AC

## 2024-06-08 MED ORDER — CLONIDINE HCL 0.1 MG PO TABS
0.2000 mg | ORAL_TABLET | Freq: Once | ORAL | Status: AC
  Administered 2024-06-08: 0.2 mg via ORAL
  Filled 2024-06-08: qty 2

## 2024-06-08 NOTE — ED Triage Notes (Signed)
 Pt presented to ED BIBA from home with c/o left sided chest pain radiating down left arm and into back. States ongoing x 2 weeks. States called EMS due to having tingling in left hand, which was the only new symptom. BP with EMS 223/130, states has been off blood pressure medications x months. Pain 8/10. Denies nausea vomiting, headache, dizziness, vision changes.

## 2024-06-08 NOTE — Discharge Instructions (Addendum)
 We have provided new prescriptions for your 3 blood pressure medications as well as the cholesterol medication.  These are prescribed as a 1 month supply with 2 refills.  You will need to follow-up with your primary care provider to get further refills after this.  Follow-up with your regular doctor.  Return to the ER for new, worsening, or persistent severe chest pain, weakness or dizziness, difficulty breathing, high blood pressure readings especially over 200 on the top number or 120 on the bottom number, or any other new or worsening symptoms that concern you.

## 2024-06-08 NOTE — ED Provider Notes (Signed)
 [facility] Provider Note    Event Date/Time   First MD Initiated Contact with Patient 06/08/24 1939     (approximate)   History   Chest Pain and Hypertension   HPI  Sherry Watkins is a 43 y.o. female with a history of diastolic dysfunction, hypertension, endometriosis, GERD, and obesity who presents with chest pain intermittently over the last two weeks, substernal, pressure-like, and intermittent.  It is not exertional.  She did feel some tingling in her left arm.  She is not having pain currently.  She reports that she has not been compliant with her blood pressure medications for several months.  She denies any cough or fever.  She has no leg pain or swelling.    I reviewed the past medical records.  The patient's most recent outpatient encounter in our system was on 7/8 of last year with cardiology for emergency department follow-up after an episode of chest pain.  Workup at that time showed no evidence of CAD and it was thought she may be having coronary spasms.   Physical Exam   Triage Vital Signs: ED Triage Vitals  Encounter Vitals Group     BP 06/08/24 1945 (!) 191/99     Girls Systolic BP Percentile --      Girls Diastolic BP Percentile --      Boys Systolic BP Percentile --      Boys Diastolic BP Percentile --      Pulse Rate 06/08/24 1945 89     Resp 06/08/24 1945 14     Temp 06/08/24 1945 99.5 F (37.5 C)     Temp Source 06/08/24 1945 Oral     SpO2 06/08/24 1942 100 %     Weight 06/08/24 1945 235 lb (106.6 kg)     Height 06/08/24 1945 5' 2 (1.575 m)     Head Circumference --      Peak Flow --      Pain Score 06/08/24 1945 7     Pain Loc --      Pain Education --      Exclude from Growth Chart --     Most recent vital signs: Vitals:   06/08/24 2130 06/08/24 2200  BP: (!) 168/99 (!) 177/101  Pulse: 93 87  Resp: 13 13  Temp:    SpO2: 98%      General: Awake, no distress.  CV:  Good peripheral perfusion. Normal heart  sounds. Resp:  Normal effort. Lungs CTAB.  Abd:  No distention.  Other:  No peripheral edema.  No calf or popliteal swelling or tenderness.    ED Results / Procedures / Treatments   Labs (all labs ordered are listed, but only abnormal results are displayed) Labs Reviewed  BASIC METABOLIC PANEL WITH GFR - Abnormal; Notable for the following components:      Result Value   Potassium 3.2 (*)    CO2 21 (*)    Glucose, Bld 112 (*)    All other components within normal limits  CBC - Abnormal; Notable for the following components:   Hemoglobin 9.8 (*)    HCT 33.4 (*)    MCV 70.9 (*)    MCH 20.8 (*)    MCHC 29.3 (*)    RDW 18.0 (*)    Platelets 591 (*)    All other components within normal limits  TROPONIN I (HIGH SENSITIVITY)  TROPONIN I (HIGH SENSITIVITY)     EKG  ED ECG REPORT I, Waylon  Hannah Strader, the attending physician, personally viewed and interpreted this ECG.  Date: 06/08/2024 EKG Time: 1940 Rate: 88 Rhythm: normal sinus rhythm QRS Axis: normal Intervals: normal ST/T Wave abnormalities: normal Narrative Interpretation: no evidence of acute ischemia    RADIOLOGY  Chest x-ray: I independently viewed and interpreted the images; there is no focal consolidation or edema   PROCEDURES:  Critical Care performed: No  Procedures   MEDICATIONS ORDERED IN ED: Medications  cloNIDine (CATAPRES) tablet 0.2 mg (0.2 mg Oral Given 06/08/24 2003)     IMPRESSION / MDM / ASSESSMENT AND PLAN / ED COURSE  I reviewed the triage vital signs and the nursing notes.  43 year old female with PMH as noted above presents with atypical, intermittent chest pain.  She is significantly hypertensive and reports she has been noncompliant with her blood pressure medication for some time.  Exam us  unremarkable for other acute findings.  EKG is nonischemic.    Differential diagnosis includes, but is not limited to, hypertensive urgency or emergency, ACS, coronary spasm,  musculoskeletal pain, GERD.  Given the intermittent nature of the pain and the fact that she is pain-free currently, there is no clinical evidence for aortic dissection or other vascular cause, or for PE.  We will obtain chest x-ray, basic labs, cardiac enzymes, and reassess.  I have also ordered p.o. clonidine as the patient stated a preference to not have IV blood pressure medication as she is worried about side effects.  Patient's presentation is most consistent with acute presentation with potential threat to life or bodily function.  The patient is on the cardiac monitor to evaluate for evidence of arrhythmia and/or significant heart rate changes.   ----------------------------------------- 10:31 PM on 06/08/2024 -----------------------------------------  Blood pressure has improved.  BMP shows normal creatinine and GFR.  CBC shows anemia but no concerning acute findings.  Troponins are negative x 2.  The patient remains chest pain-free.  She is stable for discharge home at this time.  I have represcribed her 3 blood pressure medications and her cholesterol medication as per her prior records.  I gave strict return precautions, and she expressed understanding.  FINAL CLINICAL IMPRESSION(S) / ED DIAGNOSES   Final diagnoses:  Atypical chest pain  Hypertension, unspecified type     Rx / DC Orders   ED Discharge Orders          Ordered    amLODipine  (NORVASC ) 5 MG tablet  Daily        06/08/24 2229    atorvastatin  (LIPITOR) 20 MG tablet  Daily        06/08/24 2229    losartan  (COZAAR ) 100 MG tablet  Daily        06/08/24 2229    metoprolol  succinate (TOPROL -XL) 25 MG 24 hr tablet  Daily        06/08/24 2229             Note:  This document was prepared using Dragon voice recognition software and may include unintentional dictation errors.    Jacolyn Pae, MD 06/08/24 2232

## 2024-06-27 ENCOUNTER — Ambulatory Visit: Admitting: Cardiology

## 2024-06-27 NOTE — Progress Notes (Deleted)
 Cardiology Office Note   Date:  06/27/2024  ID:  Virjean, Boman [birth year], MRN [id number] PCP: [facility]  [facility] Providers Cardiologist:  Lonni Hanson, MD { Click to update primary MD,subspecialty MD or APP then REFRESH:1}    History of Present Illness PERL FOLMAR is a 43 y.o. female with past medical history of diastolic dysfunction, hypertension, endometriosis, gastroesophageal reflux disease, obesity, who presents today after an emergency department visit for chest pain.   Multiple ED visits for varying amounts of atypical chest pain had all resolved with negative troponins nonrevealing workup in the emergency department.  Echocardiogram completed in 03/2021 demonstrated an EF of 60 to 65%, no RWMA, G1 DD, without significant valvular abnormalities.  ETT in 04/2021 was low risk without evidence of ischemia with hypertensive blood pressure response being noted.  She was evaluated in clinic 03/30/2022 for ED follow-up.  At that time she noted proximal 1 year long history of intermittent substernal chest pressure that was randomly occurring that would last for short periods of time.  She noted several week history of left shoulder and upper back burning.  She had been on Robaxin  that was prescribed in the emergency department to help with her upper back discomfort.  On exam she was chest pain-free.  She was scheduled for coronary CTA and an echocardiogram with aggressive risk factor modification.  She had multiple emergency department visits for varying symptoms of fever, chest pain, dental pain in the event medication refills that were negative.  Each time the emergency department workup was unrevealing and she was sent home with recommendations for outpatient follow-up.  She was last seen in clinic 05/30/2023 stating that she had had several bouts of lightheadedness but denies any syncope or near syncopal episodes.  Continued to have chronic chest  discomfort and dyspnea on exertion that is unchanged since 2022.  She denies swelling to her bilateral lower extremities improved with better blood pressure control.  She was continued on current medication regimen no further testing was ordered at that time.   She has been evaluated in the [facility] emergency department twice since she was last seen with last visit being 06/08/2024 with chest pain and hypertension.  She stated intermittently over the last 2 weeks she had substernal pressure-like and intermittent chest pain that was not exertional.  She noted some tingling in her left arm.  On arrival was not having any pain.  Reported she had not been compliant with her current blood pressure medications for several months.  Blood pressure was 191/99, pulse of 89, respiration 14.  Pertinent labs revealed potassium 3.2, CO2 21, blood glucose 112, hemoglobin at 9.8.  She was given Catapres  0.2 mg tablet.  Blood pressure was coming down.  EKG was nonacute.  She was chest pain-free.  Was stable for discharge.  And was represcribed her 3 previous antihypertensive medications.  She returns to clinic today  ROS: 10 point review of systems has been reviewed and considered negative except ones been listed in the HPI  Studies Reviewed      TTE 07/28/22 1. Left ventricular ejection fraction, by estimation, is 60 to 65%. The  left ventricle has normal function. The left ventricle has no regional  wall motion abnormalities. Left ventricular diastolic parameters were  normal.   2. Right ventricular systolic function is normal. The right ventricular  size is normal.   3. The mitral valve is normal in structure. No evidence of mitral valve  regurgitation.   4. The aortic valve was not well visualized. Aortic valve regurgitation  is not visualized.    Coronary CTA 04/15/22 IMPRESSION: 1. Coronary calcium  score of 0. Patient is low risk for coronary events.   2. Normal coronary origin with right dominance.   3.  No evidence of CAD.   4. CAD-RADS 0. Consider non-atherosclerotic causes of chest pain.   ETT 04/30/2021: Baseline EKG demonstrates normal sinus rhythm. The patient exhibited fair exercise capacity with a hypertensive blood pressure response. No angina was reported. No significant ST segment or T wave changes were observed during stress or recovery. No significant arrhythmia occurred. Low risk exercise tolerance test (Duke Treadmill Score = +6).   Low risk exercise tolerance test without evidence of ischemia at workload achieved. Hypertensive blood pressure response noted.   2D echo 04/16/2021: 1. Left ventricular ejection fraction, by estimation, is 60 to 65%. The  left ventricle has normal function. The left ventricle has no regional  wall motion abnormalities. Left ventricular diastolic parameters are  consistent with Grade I diastolic  dysfunction (impaired relaxation). The average left ventricular global  longitudinal strain is -17.9 %. The global longitudinal strain is normal.   2. Right ventricular systolic function is normal. The right ventricular  size is normal. Tricuspid regurgitation signal is inadequate for assessing  PA pressure. Risk Assessment/Calculations   No BP recorded.  {Refresh Note OR Click here to enter BP  :1}***       Physical Exam VS:  LMP 05/13/2015 (Approximate)        Wt Readings from Last 3 Encounters:  06/08/24 235 lb (106.6 kg)  09/28/23 234 lb (106.1 kg)  05/30/23 248 lb 12.8 oz (112.9 kg)    GEN: Well nourished, well developed in no acute distress NECK: No JVD; No carotid bruits CARDIAC: ***RRR, no murmurs, rubs, gallops RESPIRATORY:  Clear to auscultation without rales, wheezing or rhonchi  ABDOMEN: Soft, non-tender, non-distended EXTREMITIES:  No edema; No deformity   ASSESSMENT AND PLAN Hypertension Atypical chest pain Dyspnea on exertion Sleep disordered breathing Obesity    {Are you ordering a CV Procedure (e.g. stress test, cath,  DCCV, TEE, etc)?   Press F2        :[id number]}  Dispo: ***  Signed, Sruthi Maurer, NP

## 2024-07-04 ENCOUNTER — Ambulatory Visit: Admitting: Cardiology

## 2024-07-04 ENCOUNTER — Encounter: Payer: Self-pay | Admitting: Cardiology

## 2024-07-04 ENCOUNTER — Ambulatory Visit: Attending: Cardiology | Admitting: Cardiology

## 2024-07-04 VITALS — BP 115/77 | HR 69 | Ht 62.0 in | Wt 231.4 lb

## 2024-07-04 DIAGNOSIS — E782 Mixed hyperlipidemia: Secondary | ICD-10-CM | POA: Diagnosis present

## 2024-07-04 DIAGNOSIS — R0602 Shortness of breath: Secondary | ICD-10-CM | POA: Insufficient documentation

## 2024-07-04 DIAGNOSIS — R079 Chest pain, unspecified: Secondary | ICD-10-CM | POA: Insufficient documentation

## 2024-07-04 DIAGNOSIS — E66813 Obesity, class 3: Secondary | ICD-10-CM | POA: Insufficient documentation

## 2024-07-04 DIAGNOSIS — I10 Essential (primary) hypertension: Secondary | ICD-10-CM | POA: Diagnosis present

## 2024-07-04 DIAGNOSIS — I5189 Other ill-defined heart diseases: Secondary | ICD-10-CM

## 2024-07-04 DIAGNOSIS — G8929 Other chronic pain: Secondary | ICD-10-CM | POA: Insufficient documentation

## 2024-07-04 DIAGNOSIS — Z6841 Body Mass Index (BMI) 40.0 and over, adult: Secondary | ICD-10-CM | POA: Insufficient documentation

## 2024-07-04 MED ORDER — LOSARTAN POTASSIUM 100 MG PO TABS: 100.0000 mg | ORAL_TABLET | Freq: Every day | ORAL | 3 refills | Status: AC

## 2024-07-04 MED ORDER — METOPROLOL SUCCINATE ER 25 MG PO TB24: 25.0000 mg | ORAL_TABLET | Freq: Every day | ORAL | 3 refills | Status: AC

## 2024-07-04 MED ORDER — AMLODIPINE BESYLATE 5 MG PO TABS: 5.0000 mg | ORAL_TABLET | Freq: Every day | ORAL | 3 refills | Status: AC

## 2024-07-04 NOTE — Patient Instructions (Addendum)
 Medication Instructions:  Your physician recommends that you continue on your current medications as directed. Please refer to the Current Medication list given to you today.   *If you need a refill on your cardiac medications before your next appointment, please call your pharmacy*  Lab Work: Please send labs from [facility] If you have labs (blood work) drawn today and your tests are completely normal, you will receive your results only by: MyChart Message (if you have MyChart) OR A paper copy in the mail If you have any lab test that is abnormal or we need to change your treatment, we will call you to review the results.  Testing/Procedures: No test ordered today   Follow-Up: At [facility], you and your health needs are our priority.  As part of our continuing mission to provide you with exceptional heart care, our providers are all part of one team.  This team includes your primary Cardiologist (physician) and Advanced Practice Providers or APPs (Physician Assistants and Nurse Practitioners) who all work together to provide you with the care you need, when you need it.  Your next appointment:   6 month(s)  Provider:   Lonni Hanson, MD or Tylene Lunch, NP

## 2024-07-04 NOTE — Progress Notes (Signed)
 Cardiology Office Note   Date:  07/04/2024  ID:  Aaren, Krog 09/01/1981, MRN [id number] PCP: [facility]  [facility] Providers Cardiologist:  Lonni Hanson, MD     History of Present Illness Sherry Watkins is a 43 y.o. female with past medical history of diastolic dysfunction, hypertension, endometriosis, gastroesophageal reflux disease, obesity, who presents today after an emergency department visit for chest pain.   Multiple ED visits for varying amounts of atypical chest pain had all resolved with negative troponins nonrevealing workup in the emergency department.  Echocardiogram completed in 03/2021 demonstrated an EF of 60 to 65%, no RWMA, G1 DD, without significant valvular abnormalities.  ETT in 04/2021 was low risk without evidence of ischemia with hypertensive blood pressure response being noted.  She was evaluated in clinic 03/30/2022 for ED follow-up.  At that time she noted proximal 1 year long history of intermittent substernal chest pressure that was randomly occurring that would last for short periods of time.  She noted several week history of left shoulder and upper back burning.  She had been on Robaxin  that was prescribed in the emergency department to help with her upper back discomfort.  On exam she was chest pain-free.  She was scheduled for coronary CTA and an echocardiogram with aggressive risk factor modification.  She had multiple emergency department visits for varying symptoms of fever, chest pain, dental pain in the event medication refills that were negative.  Each time the emergency department workup was unrevealing and she was sent home with recommendations for outpatient follow-up.  She was last seen in clinic 05/30/2023 stating that she had had several bouts of lightheadedness but denies any syncope or near syncopal episodes.  Continued to have chronic chest discomfort and dyspnea on exertion that is unchanged since 2022.   She denies swelling to her bilateral lower extremities improved with better blood pressure control.  She was continued on current medication regimen no further testing was ordered at that time.   She has been evaluated in the [facility] emergency department twice since she was last seen with last visit being 06/08/2024 with chest pain and hypertension.  She stated intermittently over the last 2 weeks she had substernal pressure-like and intermittent chest pain that was not exertional.  She noted some tingling in her left arm.  On arrival was not having any pain.  Reported she had not been compliant with her current blood pressure medications for several months.  Blood pressure was 191/99, pulse of 89, respiration 14.  Pertinent labs revealed potassium 3.2, CO2 21, blood glucose 112, hemoglobin at 9.8.  She was given Catapres  0.2 mg tablet.  Blood pressure was coming down.  EKG was nonacute.  She was chest pain-free.  Was stable for discharge.  And was represcribed her 3 previous antihypertensive medications.  She returns to clinic today stating that she has been doing well since her visit to the emergency department.  She recently been in [place]  for approximately 2 months and had returned to [facility] when her mother was in the hospital.  She stated she had not taken any of her medications and was extremely stressed out.  Since she has been back on her medications she has not had any further chest discomfort.  She continues to have chronic shortness of breath that is unchanged.  She denies any other associated symptoms.  States that since being back on her medications that she has not had any further issues.  Continues to follow with her PCP stating that she had been started on several new medications but she was not been able to pick those up from the pharmacy due to cost.  ROS: 10 point review of systems has been reviewed and considered negative except ones been listed in the HPI  Studies Reviewed       TTE 07/28/22 1. Left ventricular ejection fraction, by estimation, is 60 to 65%. The  left ventricle has normal function. The left ventricle has no regional  wall motion abnormalities. Left ventricular diastolic parameters were  normal.   2. Right ventricular systolic function is normal. The right ventricular  size is normal.   3. The mitral valve is normal in structure. No evidence of mitral valve  regurgitation.   4. The aortic valve was not well visualized. Aortic valve regurgitation  is not visualized.    Coronary CTA 04/15/22 IMPRESSION: 1. Coronary calcium  score of 0. Patient is low risk for coronary events.   2. Normal coronary origin with right dominance.   3. No evidence of CAD.   4. CAD-RADS 0. Consider non-atherosclerotic causes of chest pain.   ETT 04/30/2021: Baseline EKG demonstrates normal sinus rhythm. The patient exhibited fair exercise capacity with a hypertensive blood pressure response. No angina was reported. No significant ST segment or T wave changes were observed during stress or recovery. No significant arrhythmia occurred. Low risk exercise tolerance test (Duke Treadmill Score = +6).   Low risk exercise tolerance test without evidence of ischemia at workload achieved. Hypertensive blood pressure response noted.   2D echo 04/16/2021: 1. Left ventricular ejection fraction, by estimation, is 60 to 65%. The  left ventricle has normal function. The left ventricle has no regional  wall motion abnormalities. Left ventricular diastolic parameters are  consistent with Grade I diastolic  dysfunction (impaired relaxation). The average left ventricular global  longitudinal strain is -17.9 %. The global longitudinal strain is normal.   2. Right ventricular systolic function is normal. The right ventricular  size is normal. Tricuspid regurgitation signal is inadequate for assessing  PA pressure. Risk Assessment/Calculations           Physical Exam VS:  BP  115/77   Pulse 69   Ht 5' 2 (1.575 m)   Wt 231 lb 6.4 oz (105 kg)   LMP 05/13/2015 (Approximate)   SpO2 99%   BMI 42.32 kg/m        Wt Readings from Last 3 Encounters:  07/04/24 231 lb 6.4 oz (105 kg)  06/08/24 235 lb (106.6 kg)  09/28/23 234 lb (106.1 kg)    GEN: Well nourished, well developed in no acute distress NECK: No JVD; No carotid bruits CARDIAC: RRR, no murmurs, rubs, gallops RESPIRATORY:  Clear to auscultation without rales, wheezing or rhonchi  ABDOMEN: Soft, non-tender, non-distended EXTREMITIES:  No edema; No deformity   ASSESSMENT AND PLAN Hypertension with blood pressure today 115/77 which is well-controlled.  Blood pressure has remained stable since she has been back on her current blood pressure medications.  She has been continued on amlodipine  5 mg daily, losartan  100 mg daily and Toprol -XL 25 mg daily.  She has been encouraged to continue to monitor her pressures 1 to 2 hours postmedication administration as well.  Atypical chest pain with a component of chronic chest pain that has been ongoing since around 2022.  Prior workup has been unrevealing and since her blood pressure has been better controlled her chest pain has resolved.  EKG reviewed  in the emergency department did not show any ischemic changes.  Workup in the emergency department was unrevealing.  Dyspnea on exertion with diastolic dysfunction that is unchanged.  Last echocardiogram revealed an LVEF of 60-65%, no RWMA, impaired relaxation due to hypertension.  No structural abnormalities.  Hyperlipidemia she is on atorvastatin  20 mg daily.  Ongoing management per PCP.  We have recently requested her most recent labs from her PCP as well unfortunately we are unable to see those results within the EHR.  Obesity with a BMI of 42.32.  Weight loss would be beneficial as her weight does complicate her pregnancies.       Dispo: Patient to return to clinic to see MD/APP in 6 months or sooner if needed for  further evaluation.  Signed, Alegandra Sommers, NP

## 2024-11-14 ENCOUNTER — Other Ambulatory Visit: Payer: Self-pay

## 2024-11-14 ENCOUNTER — Encounter: Payer: Self-pay | Admitting: Emergency Medicine

## 2024-11-14 ENCOUNTER — Emergency Department
Admission: EM | Admit: 2024-11-14 | Discharge: 2024-11-14 | Disposition: A | Discharge status: Home / Self Care | Attending: Emergency Medicine | Admitting: Emergency Medicine

## 2024-11-14 ENCOUNTER — Emergency Department

## 2024-11-14 DIAGNOSIS — M62838 Other muscle spasm: Secondary | ICD-10-CM | POA: Insufficient documentation

## 2024-11-14 DIAGNOSIS — Z79899 Other long term (current) drug therapy: Secondary | ICD-10-CM | POA: Diagnosis not present

## 2024-11-14 DIAGNOSIS — I10 Essential (primary) hypertension: Secondary | ICD-10-CM | POA: Diagnosis not present

## 2024-11-14 DIAGNOSIS — M62830 Muscle spasm of back: Secondary | ICD-10-CM

## 2024-11-14 DIAGNOSIS — R0789 Other chest pain: Secondary | ICD-10-CM | POA: Insufficient documentation

## 2024-11-14 LAB — COMPREHENSIVE METABOLIC PANEL WITH GFR
ALT: 16 U/L (ref 0–44)
AST: 31 U/L (ref 15–41)
Albumin: 4.2 g/dL (ref 3.5–5.0)
Alkaline Phosphatase: 69 U/L (ref 38–126)
Anion gap: 11 (ref 5–15)
BUN: <5 mg/dL — ABNORMAL LOW (ref 6–20)
CO2: 25 mmol/L (ref 22–32)
Calcium: 9.4 mg/dL (ref 8.9–10.3)
Chloride: 103 mmol/L (ref 98–111)
Creatinine, Ser: 0.78 mg/dL (ref 0.44–1.00)
GFR, Estimated: >60 mL/min
Glucose, Bld: 106 mg/dL — ABNORMAL HIGH (ref 70–99)
Potassium: 3.4 mmol/L — ABNORMAL LOW (ref 3.5–5.1)
Sodium: 139 mmol/L (ref 135–145)
Total Bilirubin: 0.3 mg/dL (ref 0.0–1.2)
Total Protein: 8.4 g/dL — ABNORMAL HIGH (ref 6.5–8.1)

## 2024-11-14 LAB — CBC
HCT: 36.7 % (ref 36.0–46.0)
Hemoglobin: 10.8 g/dL — ABNORMAL LOW (ref 12.0–15.0)
MCH: 21.6 pg — ABNORMAL LOW (ref 26.0–34.0)
MCHC: 29.4 g/dL — ABNORMAL LOW (ref 30.0–36.0)
MCV: 73.3 fL — ABNORMAL LOW (ref 80.0–100.0)
Platelets: 550 K/uL — ABNORMAL HIGH (ref 150–400)
RBC: 5.01 MIL/uL (ref 3.87–5.11)
RDW: 18.2 % — ABNORMAL HIGH (ref 11.5–15.5)
WBC: 9.4 K/uL (ref 4.0–10.5)
nRBC: 0 % (ref 0.0–0.2)

## 2024-11-14 LAB — TROPONIN T, HIGH SENSITIVITY
Troponin T High Sensitivity: <15 ng/L (ref 0–19)
Troponin T High Sensitivity: <15 ng/L (ref 0–19)

## 2024-11-14 MED ORDER — LIDOCAINE 5 % EX PTCH
1.0000 | MEDICATED_PATCH | Freq: Once | CUTANEOUS | Status: DC
  Administered 2024-11-14: 1 via TRANSDERMAL
  Filled 2024-11-14: qty 1

## 2024-11-14 MED ORDER — LIDOCAINE 5 % EX PTCH: 1.0000 | MEDICATED_PATCH | CUTANEOUS | 0 refills | Status: AC

## 2024-11-14 MED ORDER — METHOCARBAMOL 500 MG PO TABS
500.0000 mg | ORAL_TABLET | Freq: Once | ORAL | Status: AC
  Administered 2024-11-14: 500 mg via ORAL
  Filled 2024-11-14: qty 1

## 2024-11-14 MED ORDER — METHOCARBAMOL 500 MG PO TABS: 500.0000 mg | ORAL_TABLET | Freq: Three times a day (TID) | ORAL | 0 refills | Status: AC | PRN

## 2024-11-14 MED ORDER — KETOROLAC TROMETHAMINE 30 MG/ML IJ SOLN
60.0000 mg | Freq: Once | INTRAMUSCULAR | Status: DC
  Filled 2024-11-14: qty 2

## 2024-11-14 NOTE — ED Triage Notes (Signed)
 Patient ambulatory to triage with steady gait, without difficulty or distress noted; pt reports left sided CP radiating into back x wk with no accomp symptoms

## 2024-11-14 NOTE — ED Provider Notes (Signed)
 "  [facility] Provider Note    Event Date/Time   First MD Initiated Contact with Patient 11/14/24 [phone]     (approximate)   History   Chest Pain   HPI  Sherry Watkins is a 43 y.o. female with history of hypertension, obesity who presents to the emergency department complaints of left-sided chest pain and pain in her back worse with movement, palpation.  Denies any known injury.  Feels like a pressure.  No fevers, cough, nausea, vomiting, shortness of breath, dizziness, diaphoresis.  No history of PE, DVT, exogenous estrogen use, recent fractures, surgery, trauma, hospitalization, prolonged travel or other immobilization. No lower extremity swelling or pain. No calf tenderness.  Feels better with massage.  Tried ibuprofen  800 mg over-the-counter without relief.   History provided by patient and daughter.    Past Medical History:  Diagnosis Date   Complication of anesthesia    Depression    Endometriosis    GERD (gastroesophageal reflux disease)    NO MEDS   Headache    FREQUENTLY   Hypertension    Obesity    Ovarian cyst     Past Surgical History:  Procedure Laterality Date   ABLATION     CESAREAN SECTION     CYSTOSCOPY N/A 08/24/2018   Procedure: CYSTOSCOPY;  Surgeon: Leonce Garnette BIRCH, MD;  Location: [facility];  Service: Gynecology;  Laterality: N/A;   HYSTEROSCOPY WITH NOVASURE N/A 05/27/2015   Procedure: HYSTEROSCOPY WITH NOVASURE;  Surgeon: Bebe Furry, MD;  Location: [facility];  Service: Gynecology;  Laterality: N/A;   LAPAROSCOPY N/A 07/07/2017   Procedure: LAPAROSCOPY DIAGNOSTIC;  Surgeon: Lake Read, MD;  Location: [facility];  Service: Gynecology;  Laterality: N/A;   PARTIAL HYSTERECTOMY     ROBOTIC ASSISTED TOTAL HYSTERECTOMY Bilateral 08/24/2018   Procedure: ROBOTIC ASSISTED TOTAL HYSTERECTOMY BILATERAL SALPINGECTOMY;  Surgeon: Leonce Garnette BIRCH, MD;  Location: [facility];  Service: Gynecology;  Laterality: Bilateral;   TUBAL  LIGATION      MEDICATIONS:  Prior to Admission medications  Medication Sig Start Date End Date Taking? Authorizing Provider  acetaminophen  (TYLENOL ) 500 MG tablet Take 1,000 mg by mouth every 6 (six) hours as needed for moderate pain or headache.    [provider]  amLODipine  (NORVASC ) 5 MG tablet Take 1 tablet (5 mg total) by mouth daily. 07/04/24 07/04/25  Gerard Frederick, NP  amoxicillin  (AMOXIL ) 875 MG tablet Take 1 tablet (875 mg total) by mouth 2 (two) times daily. Patient not taking: Reported on 07/04/2024 09/28/23   Saunders Shona CROME, PA-C  atorvastatin  (LIPITOR) 20 MG tablet Take 1 tablet (20 mg total) by mouth daily. 06/08/24 09/06/24  Jacolyn Pae, MD  losartan  (COZAAR ) 100 MG tablet Take 1 tablet (100 mg total) by mouth daily. 07/04/24 07/04/25  Gerard Frederick, NP  metoprolol  succinate (TOPROL -XL) 25 MG 24 hr tablet Take 1 tablet (25 mg total) by mouth daily. 07/04/24 07/04/25  Gerard Frederick, NP    Physical Exam   Triage Vital Signs: ED Triage Vitals  Encounter Vitals Group     BP 11/14/24 0037 (!) 191/105     Girls Systolic BP Percentile --      Girls Diastolic BP Percentile --      Boys Systolic BP Percentile --      Boys Diastolic BP Percentile --      Pulse Rate 11/14/24 0037 89     Resp 11/14/24 0037 20     Temp 11/14/24 0037 97.8 F (36.6 C)  Temp Source 11/14/24 0037 Oral     SpO2 11/14/24 0037 98 %     Weight 11/14/24 0036 235 lb (106.6 kg)     Height 11/14/24 0036 5' 2 (1.575 m)     Head Circumference --      Peak Flow --      Pain Score 11/14/24 0036 9     Pain Loc --      Pain Education --      Exclude from Growth Chart --     Most recent vital signs: Vitals:   11/14/24 0037 11/14/24 0346  BP: (!) 191/105 (!) 148/86  Pulse: 89 71  Resp: 20 (!) 22  Temp: 97.8 F (36.6 C) 98.1 F (36.7 C)  SpO2: 98% 100%    CONSTITUTIONAL: Alert, responds appropriately to questions. Well-appearing; well-nourished HEAD: Normocephalic,  atraumatic EYES: Conjunctivae clear, pupils appear equal, sclera nonicteric ENT: normal nose; moist mucous membranes NECK: Supple, normal ROM CARD: RRR; S1 and S2 appreciated, tender over the anterior chest wall without deformity, crepitus RESP: Normal chest excursion without splinting or tachypnea; breath sounds clear and equal bilaterally; no wheezes, no rhonchi, no rales, no hypoxia or respiratory distress, speaking full sentences ABD/GI: Non-distended; soft, non-tender, no rebound, no guarding, no peritoneal signs BACK: The back appears normal, tender over the left trapezius muscle with no overlying skin changes, no midline spinal tenderness or step-off or deformity EXT: Normal ROM in all joints; no deformity noted, no edema, no calf tenderness or calf swelling SKIN: Normal color for age and race; warm; no rash on exposed skin NEURO: Moves all extremities equally, normal speech, normal gait PSYCH: The patient's mood and manner are appropriate.   ED Results / Procedures / Treatments   LABS: (all labs ordered are listed, but only abnormal results are displayed) Labs Reviewed  CBC - Abnormal; Notable for the following components:      Result Value   Hemoglobin 10.8 (*)    MCV 73.3 (*)    MCH 21.6 (*)    MCHC 29.4 (*)    RDW 18.2 (*)    Platelets 550 (*)    All other components within normal limits  COMPREHENSIVE METABOLIC PANEL WITH GFR - Abnormal; Notable for the following components:   Potassium 3.4 (*)    Glucose, Bld 106 (*)    BUN <5 (*)    Total Protein 8.4 (*)    All other components within normal limits  TROPONIN T, HIGH SENSITIVITY  TROPONIN T, HIGH SENSITIVITY     EKG:  EKG Interpretation Date/Time:  Wednesday November 14 2024 00:41:37 EST Ventricular Rate:  83 PR Interval:  152 QRS Duration:  82 QT Interval:  386 QTC Calculation: 453 R Axis:   99  Text Interpretation: Normal sinus rhythm Rightward axis Borderline ECG When compared with ECG of 08-Jun-2024  19:40, PREVIOUS ECG IS PRESENT Confirmed by Neomi Neptune [phone]) on 11/14/2024 3:55:33 AM         RADIOLOGY: My personal review and interpretation of imaging: Chest x-ray clear.  I have personally reviewed all radiology reports.   DG Chest 2 View Result Date: 11/14/2024 EXAM: 2 VIEW(S) XRAY OF THE CHEST 11/14/2024 12:51:00 AM COMPARISON: 7 / 8 / 25 . CLINICAL HISTORY: CP FINDINGS: LUNGS AND PLEURA: No focal pulmonary opacity. No pleural effusion. No pneumothorax. HEART AND MEDIASTINUM: No acute abnormality of the cardiac and mediastinal silhouettes. BONES AND SOFT TISSUES: No acute osseous abnormality. IMPRESSION: 1. No acute process. Electronically signed by: Norman Gatlin MD  11/14/2024 12:55 AM EST RP Workstation: [id number]     PROCEDURES:  Critical Care performed: No      .1-3 Lead EKG Interpretation  Performed by: Maizey Menendez, Josette SAILOR, DO Authorized by: Jigar Zielke, Josette SAILOR, DO     Interpretation: normal     ECG rate:  71   ECG rate assessment: normal     Rhythm: sinus rhythm     Ectopy: none     Conduction: normal       IMPRESSION / MDM / ASSESSMENT AND PLAN / ED COURSE  I reviewed the triage vital signs and the nursing notes.    Patient here with complaints of chest and back pain.  Reproducible with palpation and with movement.  The patient is on the cardiac monitor to evaluate for evidence of arrhythmia and/or significant heart rate changes.   DIFFERENTIAL DIAGNOSIS (includes but not limited to):   Chest wall pain, trapezius muscle spasm/strain, less likely ACS.  Doubt PE, dissection, pneumonia, CHF, pneumothorax.   Patient's presentation is most consistent with acute presentation with potential threat to life or bodily function.   PLAN: EKG nonischemic.  First troponin negative.  Second pending.  No risk factors for PE.  Chest x-ray reviewed and interpreted by myself and the radiologist and is clear.  She denies any infectious symptoms.  Mild anemia which is  stable.  Normal electrolytes.  Will give Toradol , Robaxin , Lidoderm  patch.   MEDICATIONS GIVEN IN ED: Medications  ketorolac  (TORADOL ) 30 MG/ML injection 60 mg (has no administration in time range)  lidocaine  (LIDODERM ) 5 % 1 patch (1 patch Transdermal Patch Applied 11/14/24 0419)  methocarbamol  (ROBAXIN ) tablet 500 mg (500 mg Oral Given 11/14/24 0418)     ED COURSE: Second troponin negative.  Patient comfortable plan for discharge home.  Will discharge with Robaxin , Lidoderm .  She has a PCP for follow-up.  Recommended massage, alternating heat and ice, stretching.  No focal neurologic deficits, red flag symptoms, midline spinal tenderness.  No indication for emergent imaging of her spine.  Doubt cauda equina, epidural abscess or hematoma,  discitis or osteomyelitis, transverse myelitis,  thoracic fracture.   At this time, I do not feel there is any life-threatening condition present. I reviewed all nursing notes, vitals, pertinent previous records.  All lab and urine results, EKGs, imaging ordered have been independently reviewed and interpreted by myself.  I reviewed all available radiology reports from any imaging ordered this visit.  Based on my assessment, I feel the patient is safe to be discharged home without further emergent workup and can continue workup as an outpatient as needed. Discussed all findings, treatment plan as well as usual and customary return precautions.  They verbalize understanding and are comfortable with this plan.  Outpatient follow-up has been provided as needed.  All questions have been answered.    CONSULTS:  none   OUTSIDE RECORDS REVIEWED: Reviewed cardiology note in August 2025.       FINAL CLINICAL IMPRESSION(S) / ED DIAGNOSES   Final diagnoses:  Chest wall pain  Spasm of left trapezius muscle     Rx / DC Orders   ED Discharge Orders          Ordered    methocarbamol  (ROBAXIN ) 500 MG tablet  Every 8 hours PRN        11/14/24 0412     lidocaine  (LIDODERM ) 5 %  Every 24 hours        11/14/24 0412  Note:  This document was prepared using Dragon voice recognition software and may include unintentional dictation errors.   Leodan Bolyard, Josette SAILOR, DO 11/14/24 [phone]  "

## 2024-11-14 NOTE — Discharge Instructions (Addendum)
 You may alternate over the counter Tylenol  1000 mg every 6 hours as needed for pain, fever and Ibuprofen  800 mg every 6-8 hours as needed for pain, fever.  Please take Ibuprofen  with food.  Do not take more than 4000 mg of Tylenol  (acetaminophen ) in a 24 hour period.  I recommend alternating heat and ice to your back to help with discomfort.  Gentle massage and stretching can also help.  Please take your muscle relaxers as needed.  These may make you drowsy so do not take with other sedatives, alcohol or when driving.
